# Patient Record
Sex: Female | Born: 1975 | Race: White | Hispanic: No | Marital: Married | Smoking: Former smoker
Health system: Southern US, Community
[De-identification: ages and names within clinical notes are randomized; demographics above are authoritative.]

## PROBLEM LIST (undated history)

## (undated) DIAGNOSIS — E039 Hypothyroidism, unspecified: Secondary | ICD-10-CM

## (undated) DIAGNOSIS — F32A Depression, unspecified: Secondary | ICD-10-CM

## (undated) DIAGNOSIS — R7303 Prediabetes: Secondary | ICD-10-CM

## (undated) DIAGNOSIS — F909 Attention-deficit hyperactivity disorder, unspecified type: Secondary | ICD-10-CM

## (undated) DIAGNOSIS — R87619 Unspecified abnormal cytological findings in specimens from cervix uteri: Secondary | ICD-10-CM

## (undated) DIAGNOSIS — J45909 Unspecified asthma, uncomplicated: Secondary | ICD-10-CM

## (undated) DIAGNOSIS — M1990 Unspecified osteoarthritis, unspecified site: Secondary | ICD-10-CM

## (undated) DIAGNOSIS — F319 Bipolar disorder, unspecified: Secondary | ICD-10-CM

## (undated) DIAGNOSIS — B977 Papillomavirus as the cause of diseases classified elsewhere: Secondary | ICD-10-CM

## (undated) DIAGNOSIS — F329 Major depressive disorder, single episode, unspecified: Secondary | ICD-10-CM

## (undated) HISTORY — DX: Major depressive disorder, single episode, unspecified: F32.9

## (undated) HISTORY — DX: Depression, unspecified: F32.A

## (undated) HISTORY — DX: Unspecified abnormal cytological findings in specimens from cervix uteri: R87.619

## (undated) HISTORY — PX: COLPOSCOPY: SHX161

## (undated) HISTORY — PX: NO PAST SURGERIES: SHX2092

## (undated) HISTORY — DX: Hypothyroidism, unspecified: E03.9

## (undated) HISTORY — PX: NECK SURGERY: SHX720

## (undated) HISTORY — DX: Papillomavirus as the cause of diseases classified elsewhere: B97.7

## (undated) HISTORY — PX: WISDOM TOOTH EXTRACTION: SHX21

---

## 2000-10-25 ENCOUNTER — Other Ambulatory Visit: Admission: RE | Admit: 2000-10-25 | Discharge: 2000-10-25 | Payer: Self-pay | Admitting: Obstetrics and Gynecology

## 2007-07-17 ENCOUNTER — Emergency Department: Payer: Self-pay | Admitting: Emergency Medicine

## 2008-09-29 ENCOUNTER — Emergency Department: Payer: Self-pay | Admitting: Internal Medicine

## 2008-12-20 ENCOUNTER — Ambulatory Visit: Payer: Self-pay

## 2010-06-16 ENCOUNTER — Ambulatory Visit: Payer: Self-pay | Admitting: Internal Medicine

## 2015-04-21 LAB — HM PAP SMEAR

## 2015-06-03 ENCOUNTER — Ambulatory Visit (INDEPENDENT_AMBULATORY_CARE_PROVIDER_SITE_OTHER): Payer: BLUE CROSS/BLUE SHIELD | Admitting: Obstetrics and Gynecology

## 2015-06-03 ENCOUNTER — Encounter: Payer: Self-pay | Admitting: Obstetrics and Gynecology

## 2015-06-03 VITALS — BP 118/73 | HR 78 | Wt 214.4 lb

## 2015-06-03 DIAGNOSIS — IMO0002 Reserved for concepts with insufficient information to code with codable children: Secondary | ICD-10-CM

## 2015-06-03 DIAGNOSIS — R896 Abnormal cytological findings in specimens from other organs, systems and tissues: Secondary | ICD-10-CM

## 2015-06-03 NOTE — Progress Notes (Signed)
Patient ID: Lisa Schroeder, female   DOB: 1976/04/25, 39 y.o.   MRN: [id number]  ENCOMPASS COLPOSCOPY PROCEDURE NOTE  39 y.o. No obstetric history on file. here for colposcopy for low-grade squamous intraepithelial neoplasia (LGSIL - encompassing HPV,mild dysplasia,CIN I) pap smear on Aug 2016. Discussed role for HPV in cervical dysplasia, need for surveillance.  Patient given informed consent, signed copy in the chart, time out was performed.  Placed in lithotomy position. Cervix viewed with speculum and colposcope after application of acetic acid.   Colposcopy adequate? Yes  acetowhite lesion(s) noted at 6 o'clock; corresponding biopsies obtained.  ECC specimen obtained. All specimens were labelled and sent to pathology. See scanned colpo sheet with detailed drawing.  Patient was given post procedure instructions.  Will follow up pathology and manage accordingly.  Routine preventative health maintenance measures emphasized.     Melody Elissa Lovett, CNM

## 2015-06-03 NOTE — Patient Instructions (Signed)
Thank you for enrolling in MyChart. Please follow the instructions below to securely access your online medical record. MyChart allows you to send messages to your doctor, view your test results, renew your prescriptions, schedule appointments, and more.  How Do I Sign Up? 1. In your Internet browser, go to [email or web address] 2. Click on the New  User? link in the Sign In box.  3. Enter your MyChart Access Code exactly as it appears below. You will not need to use this code after you have completed the sign-up process. If you do not sign up before the expiration date, you must request a new code. MyChart Access Code: [email or web address] Expires: 08/02/2015 10:20 AM  4. Enter the last four digits of your Social Security Number (xxxx) and Date of Birth (mm/dd/yyyy) as indicated and click Next. You will be taken to the next sign-up page. 5. Create a MyChart ID. This will be your MyChart login ID and cannot be changed, so think of one that is secure and easy to remember. 6. Create a MyChart password. You can change your password at any time. 7. Enter your Password Reset Question and Answer and click Next. This can be used at a later time if you forget your password.  8. Select your communication preference, and if applicable enter your e-mail address. You will receive e-mail notification when new information is available in MyChart by choosing to receive e-mail notifications and filling in your e-mail. 9. Click Sign In. You can now view your medical record.   Additional Information If you have questions, you can email [email or web address] or call [phone] to talk to our MyChart staff. Remember, MyChart is NOT to be used for urgent needs. For medical emergencies, dial 911. Abnormal Pap Test Information During a Pap test, the cells on the surface of your cervix are checked to see if they look normal, abnormal, or if they show signs of having been altered by a certain  type of virus called human papillomavirus, or HPV. Cervical cells that have been affected by HPV are called dysplasia. Dysplasia is not cancer, but describes abnormal cells found on the surface of the cervix. Depending on the degree of dysplasia, some of the cells may be considered pre-cancerous and may turn into cancer over time if follow up with a caregiver is delayed.  WHAT DOES AN ABNORMAL PAP TEST MEAN? Having an abnormal pap test does not mean that you have cancer. However, certain types of abnormal pap tests can be a sign that a person is at a higher risk of developing cancer. Your caregiver will want to do other tests to find out more about the abnormal cells. Your abnormal Pap test results could show:   Small and uncertain changes that should be carefully watched.   Cervical dysplasia that has caused mild changes and can be followed over time.  Cervical dysplasia that is more severe and needs to be followed and treated to ensure the problem goes away.  Cancer.  When severe cervical dysplasia is found and treated early, it rarely will grow into cancer.  WHAT WILL BE DONE ABOUT MY ABNORMAL PAP TEST?  A colposcopy may be needed. This is a procedure where your cervix is examined using light and magnification.  A small tissue sample of your cervix (biopsy) may need to be removed and then examined. This is often performed if there are areas that appear infected.  A sample of cells from the cervical canal may be removed  with either a small brush or scraping instrument (curette). Based on the results of the procedures above, some caregivers may recommend either cryotherapy of the cervix or a surgical LEEP where a portion of the cervix is removed. LEEP is short for "loop electrical excisional procedure." Rarely, a caregiver may recommend a cone biopsy.This is a procedure where a small, cone-shaped sample of your cervix is taken out. The part that is taken out is the area where the abnormal  cells are.  WHAT IF I HAVE A DYSPLASIA OR A CANCER? You may be referred to a specialist. Radiation may also be a treatment for more advanced cancer. Having a hysterectomy is the last treatment option for dysplasia, but it is a more common treatment for someone with cancer. All treatment options will be discussed with you by your caregiver. WHAT SHOULD YOU DO AFTER BEING TREATED? If you have had an abnormal pap test, you should continue to have regular pap tests and check-ups as directed by your caregiver. Your cervical problem will be carefully watched so it does not get worse. Also, your caregiver can watch for, and treat, any new problems that may come up. Document Released: 12/29/2010 Document Revised: 01/08/2013 Document Reviewed: 09/09/2011 [facility] Patient Information 2015 [place], [place]. This information is not intended to replace advice given to you by your health care provider. Make sure you discuss any questions you have with your health care provider. COLPOSCOPY POST-PROCEDURE INSTRUCTIONS  1. You may take Ibuprofen, Aleve or Tylenol for cramping if needed.  2. If Monsel's solution was used, you will have a black discharge.  3. Light bleeding is normal.  If bleeding is heavier than your period, please call.  4. Put nothing in your vagina until the bleeding or discharge stops (usually 2 or3 days).  5. We will call you within one week with biopsy results or discuss the results at your follow-up appointment if needed. 6.

## 2015-06-05 ENCOUNTER — Encounter: Payer: Self-pay | Admitting: Obstetrics and Gynecology

## 2015-06-06 ENCOUNTER — Telehealth: Payer: Self-pay | Admitting: *Deleted

## 2015-06-06 NOTE — Telephone Encounter (Signed)
-----   Message from Ulyses Amor, [place] sent at 06/05/2015 10:05 AM EDT ----- Please let her know colpo biopsies c/w pap results, will continue with repeat pap in 6 months as planned

## 2015-06-06 NOTE — Telephone Encounter (Signed)
Notified pt of results, she voiced understanding.

## 2015-06-09 ENCOUNTER — Encounter: Payer: Self-pay | Admitting: Obstetrics and Gynecology

## 2015-09-01 ENCOUNTER — Other Ambulatory Visit: Payer: Self-pay | Admitting: Internal Medicine

## 2015-09-01 DIAGNOSIS — R1011 Right upper quadrant pain: Secondary | ICD-10-CM

## 2015-09-08 ENCOUNTER — Ambulatory Visit
Admission: RE | Admit: 2015-09-08 | Discharge: 2015-09-08 | Disposition: A | Discharge status: Home / Self Care | Payer: BLUE CROSS/BLUE SHIELD | Source: Ambulatory Visit | Attending: Internal Medicine | Admitting: Internal Medicine

## 2015-09-08 DIAGNOSIS — R1011 Right upper quadrant pain: Secondary | ICD-10-CM | POA: Insufficient documentation

## 2015-12-16 ENCOUNTER — Ambulatory Visit: Payer: BLUE CROSS/BLUE SHIELD | Admitting: Obstetrics and Gynecology

## 2015-12-24 ENCOUNTER — Ambulatory Visit (INDEPENDENT_AMBULATORY_CARE_PROVIDER_SITE_OTHER): Payer: 59 | Admitting: Obstetrics and Gynecology

## 2015-12-24 ENCOUNTER — Other Ambulatory Visit: Payer: Self-pay | Admitting: Obstetrics and Gynecology

## 2015-12-24 ENCOUNTER — Encounter: Payer: Self-pay | Admitting: Obstetrics and Gynecology

## 2015-12-24 VITALS — BP 117/72 | HR 87 | Ht 66.0 in | Wt 225.7 lb

## 2015-12-24 DIAGNOSIS — R87612 Low grade squamous intraepithelial lesion on cytologic smear of cervix (LGSIL): Secondary | ICD-10-CM | POA: Diagnosis not present

## 2015-12-24 DIAGNOSIS — E669 Obesity, unspecified: Secondary | ICD-10-CM

## 2015-12-24 MED ORDER — PHENTERMINE HCL 37.5 MG PO TABS: 37.5000 mg | ORAL_TABLET | Freq: Every day | ORAL | Status: DC

## 2015-12-24 MED ORDER — CYANOCOBALAMIN 1000 MCG/ML IJ SOLN: 1000.0000 ug | Freq: Once | INTRAMUSCULAR | Status: DC

## 2015-12-24 NOTE — Progress Notes (Signed)
Subjective:  Lisa Schroeder is a 40 y.o. G1P0 at Unknown being seen today for weight loss management- initial visit.  Patient reports General ROS: negative and reports previous weight loss attempts: Has done weight watchers a few times over the years, with some success and then regains weight.  The following portions of the patient's history were reviewed and updated as appropriate: allergies, current medications, past family history, past medical history, past social history, past surgical history and problem list.   Objective:   Filed Vitals:   12/24/15 0833  BP: 117/72  Pulse: 87  Height: 5\' 6"  (1.676 m)  Weight: 225 lb 11.2 oz (102.377 kg)    General:  Alert, oriented and cooperative. Patient is in no acute distress.  :   :   :   :   :   :   PE: Well groomed female in no current distress,   Mental Status: Normal mood and affect. Normal behavior. Normal judgment and thought content.   Current BMI: Body mass index is 36.45 kg/(m^2).   Assessment and Plan:  Obesity  1. Low grade squamous intraepithelial lesion on cytologic smear of cervix (lgsil) - Cytology - PAP  2. Obesity (BMI 30-39.9)will start weight loss plan today   Plan: low carb, High protein diet RX for adipex 37.5 mg daily and B12 1000mcg.ml monthly, to start now with first injection given at today's visit. Reviewed side-effects common to both medications and expected outcomes. Increase daily water intake to at least 8 bottle a day, every day.  Goal is to reduse weight by 10% by end of three months, and will re-evaluate then.  RTC in 4 weeks for Nurse visit to check weight & BP, and get next B12 injections.    Please refer to After Visit Summary for other counseling recommendations.    Gleb Mcguire N [place], CNM   Anquinette Pierro NIKE Yilin Weedon, CNM      Consider the Low Glycemic Index Diet and 6 smaller meals daily .  This boosts your metabolism and regulates your sugars:   Use the protein bar by Atkins  because they have lots of fiber in them  Find the low carb flatbreads, tortillas and pita breads for sandwiches:  Joseph's makes a pita bread and a flat bread , available at [facility] and [employer]; Toufayah makes a low carb flatbread available at [employer] and HT that is 9 net carbs and 100 cal Mission makes a low carb whole wheat tortilla available at [employer],and most grocery stores with 6 net carbs and 210 cal  [place] yogurt can still have a lot of carbs .  Dannon Light N fit has 80 cal and 8 carbs

## 2015-12-25 LAB — CYTOLOGY - PAP

## 2015-12-30 ENCOUNTER — Other Ambulatory Visit: Payer: Self-pay | Admitting: Obstetrics and Gynecology

## 2015-12-31 ENCOUNTER — Telehealth: Payer: Self-pay | Admitting: *Deleted

## 2015-12-31 NOTE — Telephone Encounter (Signed)
Notified pt of results, mailed all info to pt 

## 2015-12-31 NOTE — Telephone Encounter (Signed)
-----   Message from Purcell NailsMelody N Shambley, [place] sent at 12/30/2015  9:49 PM EDT ----- Please let her know pap is still the same LGSIL, +HPV, will repeat in 6 months as planned

## 2016-01-19 ENCOUNTER — Ambulatory Visit (INDEPENDENT_AMBULATORY_CARE_PROVIDER_SITE_OTHER): Payer: 59 | Admitting: Obstetrics and Gynecology

## 2016-01-19 VITALS — BP 131/99 | HR 66 | Wt 206.4 lb

## 2016-01-19 DIAGNOSIS — E669 Obesity, unspecified: Secondary | ICD-10-CM | POA: Diagnosis not present

## 2016-01-19 MED ORDER — CYANOCOBALAMIN 1000 MCG/ML IJ SOLN
1000.0000 ug | Freq: Once | INTRAMUSCULAR | Status: AC
  Administered 2016-01-19: 1000 ug via INTRAMUSCULAR

## 2016-01-19 NOTE — Progress Notes (Signed)
Patient ID: Lisa Schroeder, female   DOB: [birth year], 40 y.o.   MRN: [id number] Pt presents for weight, B/P, B-12 injection. No side effects of medication-Phentermine, or B-12.  Weight loss of 9 lbs. Encouraged eating healthy and exercise. Pt to check B/P daily and contact office if it remains elevated or develops s/s. C/O headache yesterday. No dizziness, no blurred vision, no headache.

## 2016-02-16 ENCOUNTER — Ambulatory Visit: Payer: 59

## 2016-03-01 ENCOUNTER — Ambulatory Visit (INDEPENDENT_AMBULATORY_CARE_PROVIDER_SITE_OTHER): Payer: 59 | Admitting: Obstetrics and Gynecology

## 2016-03-01 VITALS — BP 121/83 | HR 82 | Wt 213.1 lb

## 2016-03-01 DIAGNOSIS — E669 Obesity, unspecified: Secondary | ICD-10-CM

## 2016-03-01 MED ORDER — CYANOCOBALAMIN 1000 MCG/ML IJ SOLN
1000.0000 ug | Freq: Once | INTRAMUSCULAR | Status: AC
  Administered 2016-03-01: 1000 ug via INTRAMUSCULAR

## 2016-03-01 NOTE — Progress Notes (Signed)
Patient ID: Lisa Schroeder, female   DOB: 1976-04-20, 40 y.o.   MRN: [id number] Pt presents for weight, B/P, B-12 injection. No side effects of medication-Phentermine, or B-12.  Weight gain of  7 lbs. Pt states she is only taking 1/2 tab. Because of her BP and feeling bad in pm with headache and fatigue.  Encouraged eating healthy and exercise.

## 2016-03-31 ENCOUNTER — Encounter: Payer: Self-pay | Admitting: Obstetrics and Gynecology

## 2016-03-31 ENCOUNTER — Ambulatory Visit (INDEPENDENT_AMBULATORY_CARE_PROVIDER_SITE_OTHER): Payer: 59 | Admitting: Obstetrics and Gynecology

## 2016-03-31 VITALS — BP 119/84 | HR 82 | Ht 65.0 in | Wt 212.5 lb

## 2016-03-31 DIAGNOSIS — E669 Obesity, unspecified: Secondary | ICD-10-CM

## 2016-03-31 MED ORDER — CYANOCOBALAMIN 1000 MCG/ML IJ SOLN: 1000.0000 ug | Freq: Once | INTRAMUSCULAR | Status: DC

## 2016-03-31 MED ORDER — CYANOCOBALAMIN 1000 MCG/ML IJ SOLN
1000.0000 ug | Freq: Once | INTRAMUSCULAR | Status: AC
  Administered 2016-03-31: 1000 ug via INTRAMUSCULAR

## 2016-03-31 NOTE — Progress Notes (Signed)
Patient ID: Lisa Schroeder, female   DOB: Feb 21, 1976, 40 y.o.   MRN: [id number] Pt is here for weight management, desires refill on her Phentermine Denies any s/e, encouraged patient to exercise and watch her diet  01/19/16 wt-206lb 03/01/16 wt-213lb   Wt- 212.5lb BP-119/84 P-82

## 2016-04-26 ENCOUNTER — Ambulatory Visit (INDEPENDENT_AMBULATORY_CARE_PROVIDER_SITE_OTHER): Payer: 59 | Admitting: Obstetrics and Gynecology

## 2016-04-26 VITALS — BP 111/78 | HR 90 | Wt 210.4 lb

## 2016-04-26 DIAGNOSIS — E669 Obesity, unspecified: Secondary | ICD-10-CM

## 2016-04-26 MED ORDER — CYANOCOBALAMIN 1000 MCG/ML IJ SOLN
1000.0000 ug | Freq: Once | INTRAMUSCULAR | Status: AC
  Administered 2016-04-26: 1000 ug via INTRAMUSCULAR

## 2016-04-26 NOTE — Progress Notes (Signed)
Patient ID: Lisa Schroeder, female   DOB: [birth year], 40 y.o.   MRN: [id number] Pt presents for weight, B/P, B-12 injection. No side effects of medication-Phentermine, or B-12.  Weight loss of  2 lbs. Encouraged eating healthy and exercise.

## 2016-05-24 ENCOUNTER — Ambulatory Visit (INDEPENDENT_AMBULATORY_CARE_PROVIDER_SITE_OTHER): Payer: 59 | Admitting: Obstetrics and Gynecology

## 2016-05-24 VITALS — BP 113/76 | HR 65 | Ht 65.0 in | Wt 208.5 lb

## 2016-05-24 DIAGNOSIS — E669 Obesity, unspecified: Secondary | ICD-10-CM

## 2016-05-24 MED ORDER — CYANOCOBALAMIN 1000 MCG/ML IJ SOLN
1000.0000 ug | Freq: Once | INTRAMUSCULAR | Status: AC
  Administered 2016-05-24: 1000 ug via INTRAMUSCULAR

## 2016-05-24 NOTE — Progress Notes (Signed)
Patient ID: Lisa Schroeder, female   DOB: July 27, 1976, 40 y.o.   MRN: [id number] Pt presents for weight, B/P, B-12 injection. No side effects of medication-Phentermine, or B-12.  Weight gain of _1_ lbs. Encouraged eating healthy and exercise.

## 2016-06-21 ENCOUNTER — Ambulatory Visit (INDEPENDENT_AMBULATORY_CARE_PROVIDER_SITE_OTHER): Payer: 59 | Admitting: Obstetrics and Gynecology

## 2016-06-21 VITALS — BP 117/84 | HR 98 | Wt 206.2 lb

## 2016-06-21 DIAGNOSIS — E669 Obesity, unspecified: Secondary | ICD-10-CM | POA: Diagnosis not present

## 2016-06-21 MED ORDER — CYANOCOBALAMIN 1000 MCG/ML IJ SOLN
1000.0000 ug | Freq: Once | INTRAMUSCULAR | Status: AC
  Administered 2016-06-21: 1000 ug via INTRAMUSCULAR

## 2016-06-21 NOTE — Progress Notes (Signed)
Pt is here for wt, bp check, b-12 inj She only take 1/2 tablet, makes her too jittery  05/24/16 wt-208 04/26/16 wt-210

## 2016-07-02 ENCOUNTER — Ambulatory Visit (INDEPENDENT_AMBULATORY_CARE_PROVIDER_SITE_OTHER): Payer: 59 | Admitting: Obstetrics and Gynecology

## 2016-07-02 ENCOUNTER — Encounter: Payer: Self-pay | Admitting: Obstetrics and Gynecology

## 2016-07-02 ENCOUNTER — Other Ambulatory Visit: Payer: Self-pay | Admitting: Obstetrics and Gynecology

## 2016-07-02 ENCOUNTER — Ambulatory Visit (INDEPENDENT_AMBULATORY_CARE_PROVIDER_SITE_OTHER): Payer: 59

## 2016-07-02 VITALS — BP 114/84 | HR 77 | Ht 65.0 in | Wt 207.9 lb

## 2016-07-02 DIAGNOSIS — R1031 Right lower quadrant pain: Secondary | ICD-10-CM

## 2016-07-02 DIAGNOSIS — R87612 Low grade squamous intraepithelial lesion on cytologic smear of cervix (LGSIL): Secondary | ICD-10-CM | POA: Diagnosis not present

## 2016-07-02 DIAGNOSIS — N83201 Unspecified ovarian cyst, right side: Secondary | ICD-10-CM

## 2016-07-02 MED ORDER — IBUPROFEN 800 MG PO TABS: 800.0000 mg | ORAL_TABLET | Freq: Three times a day (TID) | ORAL | 1 refills | Status: DC | PRN

## 2016-07-02 NOTE — Patient Instructions (Signed)
Thank you for enrolling in MyChart. Please follow the instructions below to securely access your online medical record. MyChart allows you to send messages to your doctor, view your test results, renew your prescriptions, schedule appointments, and more.  How Do I Sign Up? 1. In your Internet browser, go to [email or web address]. 2. Click on the New  User? link in the Sign In box.  3. Enter your MyChart Access Code exactly as it appears below. You will not need to use this code after you have completed the sign-up process. If you do not sign up before the expiration date, you must request a new code. MyChart Access Code: [email or web address] Expires: 08/31/2016  9:29 AM  4. Enter the last four digits of your Social Security Number (xxxx) and Date of Birth (mm/dd/yyyy) as indicated and click Next. You will be taken to the next sign-up page. 5. Create a MyChart ID. This will be your MyChart login ID and cannot be changed, so think of one that is secure and easy to remember. 6. Create a MyChart password. You can change your password at any time. 7. Enter your Password Reset Question and Answer and click Next. This can be used at a later time if you forget your password.  8. Select your communication preference, and if applicable enter your e-mail address. You will receive e-mail notification when new information is available in MyChart by choosing to receive e-mail notifications and filling in your e-mail. 9. Click Sign In. You can now view your medical record.   Additional Information If you have questions, you can email [email or web address] or call [phone] to talk to our MyChart staff. Remember, MyChart is NOT to be used for urgent needs. For medical emergencies, dial 911.   Ovarian Cyst An ovarian cyst is a fluid-filled sac that forms on an ovary. The ovaries are small organs that produce eggs in women. Various types of cysts can form on the ovaries. Most are not  cancerous. Many do not cause problems, and they often go away on their own. Some may cause symptoms and require treatment. Common types of ovarian cysts include:  Functional cysts--These cysts may occur every month during the menstrual cycle. This is normal. The cysts usually go away with the next menstrual cycle if the woman does not get pregnant. Usually, there are no symptoms with a functional cyst.  Endometrioma cysts--These cysts form from the tissue that lines the uterus. They are also called "chocolate cysts" because they become filled with blood that turns brown. This type of cyst can cause pain in the lower abdomen during intercourse and with your menstrual period.  Cystadenoma cysts--This type develops from the cells on the outside of the ovary. These cysts can get very big and cause lower abdomen pain and pain with intercourse. This type of cyst can twist on itself, cut off its blood supply, and cause severe pain. It can also easily rupture and cause a lot of pain.  Dermoid cysts--This type of cyst is sometimes found in both ovaries. These cysts may contain different kinds of body tissue, such as skin, teeth, hair, or cartilage. They usually do not cause symptoms unless they get very big.  Theca lutein cysts--These cysts occur when too much of a certain hormone (human chorionic gonadotropin) is produced and overstimulates the ovaries to produce an egg. This is most common after procedures used to assist with the conception of a baby (in vitro fertilization). CAUSES   Fertility drugs can  cause a condition in which multiple large cysts are formed on the ovaries. This is called ovarian hyperstimulation syndrome.  A condition called polycystic ovary syndrome can cause hormonal imbalances that can lead to nonfunctional ovarian cysts. SIGNS AND SYMPTOMS  Many ovarian cysts do not cause symptoms. If symptoms are present, they may include:  Pelvic pain or pressure.  Pain in the lower  abdomen.  Pain during sexual intercourse.  Increasing girth (swelling) of the abdomen.  Abnormal menstrual periods.  Increasing pain with menstrual periods.  Stopping having menstrual periods without being pregnant. DIAGNOSIS  These cysts are commonly found during a routine or annual pelvic exam. Tests may be ordered to find out more about the cyst. These tests may include:  Ultrasound.  X-ray of the pelvis.  CT scan.  MRI.  Blood tests. TREATMENT  Many ovarian cysts go away on their own without treatment. Your health care provider may want to check your cyst regularly for 2-3 months to see if it changes. For women in menopause, it is particularly important to monitor a cyst closely because of the higher rate of ovarian cancer in menopausal women. When treatment is needed, it may include any of the following:  A procedure to drain the cyst (aspiration). This may be done using a long needle and ultrasound. It can also be done through a laparoscopic procedure. This involves using a thin, lighted tube with a tiny camera on the end (laparoscope) inserted through a small incision.  Surgery to remove the whole cyst. This may be done using laparoscopic surgery or an open surgery involving a larger incision in the lower abdomen.  Hormone treatment or birth control pills. These methods are sometimes used to help dissolve a cyst. HOME CARE INSTRUCTIONS   Only take over-the-counter or prescription medicines as directed by your health care provider.  Follow up with your health care provider as directed.  Get regular pelvic exams and Pap tests. SEEK MEDICAL CARE IF:   Your periods are late, irregular, or painful, or they stop.  Your pelvic pain or abdominal pain does not go away.  Your abdomen becomes larger or swollen.  You have pressure on your bladder or trouble emptying your bladder completely.  You have pain during sexual intercourse.  You have feelings of fullness,  pressure, or discomfort in your stomach.  You lose weight for no apparent reason.  You feel generally ill.  You become constipated.  You lose your appetite.  You develop acne.  You have an increase in body and facial hair.  You are gaining weight, without changing your exercise and eating habits.  You think you are pregnant. SEEK IMMEDIATE MEDICAL CARE IF:   You have increasing abdominal pain.  You feel sick to your stomach (nauseous), and you throw up (vomit).  You develop a fever that comes on suddenly.  You have abdominal pain during a bowel movement.  Your menstrual periods become heavier than usual. MAKE SURE YOU:  Understand these instructions.  Will watch your condition.  Will get help right away if you are not doing well or get worse.   This information is not intended to replace advice given to you by your health care provider. Make sure you discuss any questions you have with your health care provider.   Document Released: 09/13/2005 Document Revised: 09/18/2013 Document Reviewed: 05/21/2013 Elsevier Interactive Patient Education [employer]2016 Elsevier Inc.

## 2016-07-02 NOTE — Progress Notes (Signed)
Subjective:     Patient ID: Lisa Schroeder, female   DOB: 05-Oct-1975, 40 y.o.   MRN: [id number]  HPI Previous pap abnormal and here for repeat. Does reports RLQ colicky pain since December 2016, and reports abdominal ultrasound done- was told everything is normal. Pain continues, mild and occurs daily. Does not radiate and no association with BM, urination or sex. Is currently amenorrheic due to mirena use.  Review of Systems Negative except stated above in HPI    Objective:   Physical Exam A&O x4 Well groomed female in no distress Blood pressure 114/84, pulse 77, height 5\' 5"  (1.651 m), weight 207 lb 14.4 oz (94.3 kg). Abdomen soft and non-tender on exam, BSx4 normal Pelvic exam: normal external genitalia, vulva, vagina, cervix, uterus and adnexa. pelvic ultrasound today reveals:  Findings:  The uterus measures 7 x 3.5 x 4.1 cm. Echo texture is homogenous without evidence of focal masses.  The Endometrium measures 4.6 mm.  Right Ovary measures 2 x 1 x 1.6 cm. It is normal in appearance. Left Ovary measures 2.6 x 1.3 x 1.6 cm. It is normal appearance. Survey of the adnexa shows a simple cyst measuring 2.5 x 1.5 x 1.7cmin the right adnexa.  The cyst is not associated with the right ovary.  There is a small amuont of free fluid surrounding the cyst.  There is no free fluid in the cul de sac.     Assessment:     Repeat pap RLQ colicky pain Right adnexal cyst    Plan:     Started on ibuprofen 800mg  tid x 1 week, then prn. Started on LoLoEstrin continuously x 6 weeks. Will return in six weeks for follow up u/s and then 6 months for AE.  Joane Postel [place], CNM

## 2016-07-05 LAB — CYTOLOGY - PAP

## 2016-07-12 ENCOUNTER — Telehealth: Payer: Self-pay | Admitting: *Deleted

## 2016-07-12 NOTE — Telephone Encounter (Signed)
-----   Message from Purcell NailsMelody N Shambley, [place] sent at 07/07/2016 11:53 AM EDT ----- Please let her know her pap was abnormal, ASCUS,HPV+, please mail info on findings, and she needs a colpo.

## 2016-07-12 NOTE — Telephone Encounter (Signed)
Mailed pt all info about her pap results

## 2016-07-19 ENCOUNTER — Ambulatory Visit (INDEPENDENT_AMBULATORY_CARE_PROVIDER_SITE_OTHER): Payer: 59 | Admitting: Obstetrics and Gynecology

## 2016-07-19 ENCOUNTER — Ambulatory Visit: Payer: 59

## 2016-07-19 VITALS — BP 118/74 | HR 65 | Ht 65.0 in | Wt 204.5 lb

## 2016-07-19 DIAGNOSIS — E669 Obesity, unspecified: Secondary | ICD-10-CM | POA: Diagnosis not present

## 2016-07-19 MED ORDER — CYANOCOBALAMIN 1000 MCG/ML IJ SOLN
1000.0000 ug | Freq: Once | INTRAMUSCULAR | Status: AC
  Administered 2016-07-19: 1000 ug via INTRAMUSCULAR

## 2016-07-19 NOTE — Progress Notes (Signed)
Pt presents for 1 month f/u wt,bp, and b12. She is taking only 1/2 tab daily. NO s/e noted. Wt is down 2#. Pt advised she needs to f/u with [facility] in 4 weeks. She has a f/u appointment for u/s results in 3 weeks. Pt would like to f/u with [facility] at that time.

## 2016-08-10 ENCOUNTER — Other Ambulatory Visit: Payer: Self-pay | Admitting: Obstetrics and Gynecology

## 2016-08-10 DIAGNOSIS — N83201 Unspecified ovarian cyst, right side: Secondary | ICD-10-CM

## 2016-08-11 ENCOUNTER — Other Ambulatory Visit: Payer: Self-pay | Admitting: Obstetrics and Gynecology

## 2016-08-11 ENCOUNTER — Encounter: Payer: Self-pay | Admitting: Obstetrics and Gynecology

## 2016-08-11 ENCOUNTER — Ambulatory Visit (INDEPENDENT_AMBULATORY_CARE_PROVIDER_SITE_OTHER): Payer: 59 | Admitting: Obstetrics and Gynecology

## 2016-08-11 ENCOUNTER — Ambulatory Visit (INDEPENDENT_AMBULATORY_CARE_PROVIDER_SITE_OTHER): Payer: 59

## 2016-08-11 VITALS — BP 113/82 | HR 73 | Wt 203.0 lb

## 2016-08-11 DIAGNOSIS — Z9889 Other specified postprocedural states: Secondary | ICD-10-CM

## 2016-08-11 DIAGNOSIS — Z9289 Personal history of other medical treatment: Secondary | ICD-10-CM

## 2016-08-11 DIAGNOSIS — R87611 Atypical squamous cells cannot exclude high grade squamous intraepithelial lesion on cytologic smear of cervix (ASC-H): Secondary | ICD-10-CM

## 2016-08-11 DIAGNOSIS — N83201 Unspecified ovarian cyst, right side: Secondary | ICD-10-CM | POA: Diagnosis not present

## 2016-08-11 NOTE — Progress Notes (Signed)
ENCOMPASS COLPOSCOPY PROCEDURE NOTE  40 y.o. G1P0 here for colposcopy for ASCUS with POSITIVE high risk HPV pap smear on 06/2016. Discussed role for HPV in cervical dysplasia, need for surveillance.  Patient given informed consent, signed copy in the chart, time out was performed.  Placed in lithotomy position. Cervix viewed with speculum and colposcope after application of acetic acid.   Colposcopy adequate? Yes  visible lesion(s) at 5 o'clock and mosaicism noted at 12 o'clock; corresponding biopsies obtained.  ECC specimen obtained. IUD string noted and not disturbed. All specimens were labelled and sent to pathology. See scanned colpo sheet with detailed drawing.   Patient was given post procedure instructions.  Will follow up pathology and manage accordingly.  Routine preventative health maintenance measures emphasized.  Reviewed the following.   Indications: F/U Right adnexal cyst Findings:  The uterus measures 7.7 x 3.2 x 3.9 cm. An IUD is seen and on 3D imaging appears to be in the proper placement within the uterus.  Echo texture is homogenous without evidence of focal masses.  The Endometrium measures 3.2 mm.  Right Ovary measures 2.2 x 1.2 x 1.7 cm, and appears WNL. Left Ovary measures 2.6 x 2.1 x 1.6 cm, a 1 cm dominant follicle is seen, otherwise, appears WNL. Survey of the right adnexa again demonstrates a cystic area today measuring 2.0 x 1.2 x 1.4 cm which is smaller than prior ultrasound. Left adnexa is surveyed and appears WNL. There is no free fluid in the cul de sac.  Impression: 1. Right adnexal cyst which is smaller on today's exam.  Will continue current OCPs (LoLoEstrin) for 4 more weeks and then stop.   Eliot Bencivenga Suzan NailerN Ido Wollman, CNM

## 2016-08-12 ENCOUNTER — Encounter: Payer: 59 | Admitting: Obstetrics and Gynecology

## 2016-08-12 ENCOUNTER — Other Ambulatory Visit: Payer: 59

## 2016-08-13 ENCOUNTER — Encounter: Payer: 59 | Admitting: Obstetrics and Gynecology

## 2016-08-13 ENCOUNTER — Other Ambulatory Visit: Payer: 59

## 2016-08-16 ENCOUNTER — Encounter: Payer: Self-pay | Admitting: Obstetrics and Gynecology

## 2017-01-06 ENCOUNTER — Encounter: Payer: 59 | Admitting: Obstetrics and Gynecology

## 2017-01-13 ENCOUNTER — Encounter: Payer: 59 | Admitting: Obstetrics and Gynecology

## 2017-01-17 ENCOUNTER — Other Ambulatory Visit: Payer: Self-pay | Admitting: Obstetrics and Gynecology

## 2017-01-17 ENCOUNTER — Encounter: Payer: Self-pay | Admitting: Obstetrics and Gynecology

## 2017-01-17 ENCOUNTER — Ambulatory Visit (INDEPENDENT_AMBULATORY_CARE_PROVIDER_SITE_OTHER): Payer: 59 | Admitting: Obstetrics and Gynecology

## 2017-01-17 VITALS — BP 104/73 | HR 81 | Ht 66.0 in | Wt 215.1 lb

## 2017-01-17 DIAGNOSIS — Z01419 Encounter for gynecological examination (general) (routine) without abnormal findings: Secondary | ICD-10-CM

## 2017-01-17 MED ORDER — CYANOCOBALAMIN 1000 MCG/ML IJ SOLN: 1000.0000 ug | INTRAMUSCULAR | 2 refills | Status: DC

## 2017-01-17 MED ORDER — BUPROPION HCL 100 MG PO TABS: 100.0000 mg | ORAL_TABLET | Freq: Every day | ORAL | 4 refills | Status: DC

## 2017-01-17 MED ORDER — PHENDIMETRAZINE TARTRATE 35 MG PO TABS: 1.0000 | ORAL_TABLET | Freq: Every day | ORAL | 2 refills | Status: DC

## 2017-01-17 MED ORDER — CYANOCOBALAMIN 1000 MCG/ML IJ SOLN
1000.0000 ug | Freq: Once | INTRAMUSCULAR | Status: AC
  Administered 2017-01-17: 1000 ug via INTRAMUSCULAR

## 2017-01-17 NOTE — Addendum Note (Signed)
Addended by: Marchelle Folks on: 01/17/2017 05:21 PM   Modules accepted: Orders

## 2017-01-17 NOTE — Progress Notes (Signed)
Subjective:   Lisa Schroeder is a 41 y.o. G1P0 Caucasian female here for a routine well-woman exam.  No LMP recorded (lmp unknown). Patient is not currently having periods (Reason: IUD).    Current complaints: desires weight loss medications but couldn't tolerate adipex due to headaches. Heard about contrave and desires trying it. PCP: Dan Humphreys       does desire labs  Social History: Sexual: heterosexual Marital Status: single Living situation: with family Occupation: dental hygenist Tobacco/alcohol: no tobacco use Illicit drugs: no history of illicit drug use  The following portions of the patient's history were reviewed and updated as appropriate: allergies, current medications, past family history, past medical history, past social history, past surgical history and problem list.  Past Medical History Past Medical History:  Diagnosis Date  . Abnormal Pap smear of cervix   . HPV in female     Past Surgical History Past Surgical History:  Procedure Laterality Date  . COLPOSCOPY     for many yrs per pt    Gynecologic History G1P0  No LMP recorded (lmp unknown). Patient is not currently having periods (Reason: IUD). Contraception: IUD Last Pap: 2017. Results were: abnormal   Obstetric History OB History  Gravida Para Term Preterm AB Living  1         1  SAB TAB Ectopic Multiple Live Births          1    # Outcome Date GA Lbr Len/2nd Weight Sex Delivery Anes PTL Lv  1 Gravida 2007    F Vag-Spont   LIV      Current Medications Current Outpatient Prescriptions on File Prior to Visit  Medication Sig Dispense Refill  . albuterol (PROVENTIL HFA;VENTOLIN HFA) 108 (90 Base) MCG/ACT inhaler Inhale into the lungs.    Marland Kitchen ibuprofen (ADVIL,MOTRIN) 800 MG tablet Take 1 tablet (800 mg total) by mouth every 8 (eight) hours as needed. 60 tablet 1  . lamoTRIgine (LAMICTAL) 100 MG tablet Take 100 mg by mouth 2 (two) times daily.     Marland Kitchen levonorgestrel (MIRENA) 20 MCG/24HR IUD 1 each by  Intrauterine route once.    . lurasidone (LATUDA) 40 MG TABS tablet Take 40 mg by mouth daily with breakfast.     No current facility-administered medications on file prior to visit.     Review of Systems Patient denies any headaches, blurred vision, shortness of breath, chest pain, abdominal pain, problems with bowel movements, urination, or intercourse.  Objective:  BP 104/73   Pulse 81   Ht  (1.676 m)   Wt 215 lb 1.6 oz (97.6 kg)   LMP  (LMP Unknown)   BMI 34.72 kg/m  Physical Exam  General:  Well developed, well nourished, no acute distress. She is alert and oriented x3. Skin:  Warm and dry Neck:  Midline trachea, no thyromegaly or nodules Cardiovascular: Regular rate and rhythm, no murmur heard Lungs:  Effort normal, all lung fields clear to auscultation bilaterally Breasts:  No dominant palpable mass, retraction, or nipple discharge Abdomen:  Soft, non tender, no hepatosplenomegaly or masses Pelvic:  External genitalia is normal in appearance.  The vagina is normal in appearance. The cervix is bulbous, no CMT. IUD string noted.  Thin prep pap is done with HR HPV cotesting. Uterus is felt to be normal size, shape, and contour.  No adnexal masses or tenderness noted. Extremities:  No swelling or varicosities noted Psych:  She has a normal mood and affect  Assessment:  Healthy well-woman exam Obesity IUD check   Plan:  Will try wellbutrin  with Bontril  daily (in place of Contrave). B12 given after lab draw-will RTC 4 weeks for wt/bp/B12. F/U 1 year for AE and 6 months for repeat pap, or sooner if needed   Adalea Handler Suzan Nailer, CNM

## 2017-01-18 LAB — COMPREHENSIVE METABOLIC PANEL
ALK PHOS: 96 IU/L (ref 39–117)
ALT: 18 IU/L (ref 0–32)
AST: 12 IU/L (ref 0–40)
Albumin/Globulin Ratio: 1.6 (ref 1.2–2.2)
Albumin: 4.7 g/dL (ref 3.5–5.5)
BUN/Creatinine Ratio: 13 (ref 9–23)
BUN: 10 mg/dL (ref 6–20)
Bilirubin Total: 0.3 mg/dL (ref 0.0–1.2)
CO2: 27 mmol/L (ref 18–29)
CREATININE: 0.78 mg/dL (ref 0.57–1.00)
Calcium: 9.5 mg/dL (ref 8.7–10.2)
Chloride: 99 mmol/L (ref 96–106)
GFR calc Af Amer: 111 mL/min/{1.73_m2} (ref >59)
GFR calc non Af Amer: 96 mL/min/{1.73_m2} (ref >59)
GLUCOSE: 98 mg/dL (ref 65–99)
Globulin, Total: 3 g/dL (ref 1.5–4.5)
Potassium: 4.4 mmol/L (ref 3.5–5.2)
Sodium: 141 mmol/L (ref 134–144)
Total Protein: 7.7 g/dL (ref 6.0–8.5)

## 2017-01-18 LAB — THYROID PANEL WITH TSH
Free Thyroxine Index: 1.4 (ref 1.2–4.9)
T3 Uptake Ratio: 22 % — ABNORMAL LOW (ref 24–39)
T4, Total: 6.3 ug/dL (ref 4.5–12.0)
TSH: 4.2 u[IU]/mL (ref 0.450–4.500)

## 2017-01-18 LAB — HEPATITIS C ANTIBODY: Hep C Virus Ab: <0.1 s/co ratio (ref 0.0–0.9)

## 2017-01-18 LAB — B12 AND FOLATE PANEL
FOLATE: 18.2 ng/mL (ref >3.0)
VITAMIN B 12: 671 pg/mL (ref 232–1245)

## 2017-01-18 LAB — HIV ANTIBODY (ROUTINE TESTING W REFLEX): HIV SCREEN 4TH GENERATION: NONREACTIVE

## 2017-01-18 LAB — LIPID PANEL
Chol/HDL Ratio: 4.2 ratio (ref 0.0–4.4)
Cholesterol, Total: 197 mg/dL (ref 100–199)
HDL: 47 mg/dL (ref >39)
LDL CALC: 117 mg/dL — AB (ref 0–99)
Triglycerides: 164 mg/dL — ABNORMAL HIGH (ref 0–149)
VLDL CHOLESTEROL CAL: 33 mg/dL (ref 5–40)

## 2017-01-18 LAB — HEMOGLOBIN A1C
ESTIMATED AVERAGE GLUCOSE: 114 mg/dL
HEMOGLOBIN A1C: 5.6 % (ref 4.8–5.6)

## 2017-01-19 LAB — CYTOLOGY - PAP

## 2017-01-26 ENCOUNTER — Other Ambulatory Visit: Payer: Self-pay | Admitting: Obstetrics and Gynecology

## 2017-02-11 ENCOUNTER — Other Ambulatory Visit: Payer: Self-pay | Admitting: *Deleted

## 2017-02-14 ENCOUNTER — Ambulatory Visit (INDEPENDENT_AMBULATORY_CARE_PROVIDER_SITE_OTHER): Payer: BLUE CROSS/BLUE SHIELD | Admitting: Obstetrics and Gynecology

## 2017-02-14 VITALS — BP 115/82 | HR 78 | Ht 66.0 in | Wt 210.5 lb

## 2017-02-14 DIAGNOSIS — E669 Obesity, unspecified: Secondary | ICD-10-CM

## 2017-02-14 MED ORDER — CYANOCOBALAMIN 1000 MCG/ML IJ SOLN
1000.0000 ug | Freq: Once | INTRAMUSCULAR | Status: AC
  Administered 2017-02-14: 1000 ug via INTRAMUSCULAR

## 2017-02-14 NOTE — Progress Notes (Signed)
Pt presents for  Weight,B/P, B-12 injection. No side effects of medications-Phentermine or B-12. Weight loss _5___ lbs. Encourage eating healthy and exercise.Swimming for exercise,walking. No side effects. States she is trying to stay away from "white stuff"the patient states she is taking 1/2 pill. Return to office in 4 weeks for next inj.

## 2017-03-14 ENCOUNTER — Ambulatory Visit (INDEPENDENT_AMBULATORY_CARE_PROVIDER_SITE_OTHER): Payer: BLUE CROSS/BLUE SHIELD | Admitting: Obstetrics and Gynecology

## 2017-03-14 VITALS — BP 109/81 | HR 78 | Ht 66.0 in | Wt 216.6 lb

## 2017-03-14 DIAGNOSIS — E669 Obesity, unspecified: Secondary | ICD-10-CM | POA: Diagnosis not present

## 2017-03-14 MED ORDER — CYANOCOBALAMIN 1000 MCG/ML IJ SOLN
1000.0000 ug | Freq: Once | INTRAMUSCULAR | Status: AC
  Administered 2017-03-14: 1000 ug via INTRAMUSCULAR

## 2017-03-14 NOTE — Progress Notes (Signed)
Patient ID: Lisa Schroeder, female   DOB: November 12, 1975, 41 y.o.   MRN: [id number] Pt presents for weight, B/P, B-12 injection. No side effects of medication- B-12.  Weight gain of 6 lbs. Encouraged eating healthy and exercise. Pt did not take phentermine this month at all. She stated she did not like the way it made her fill. Continues with Wellbutrin. Pt states she is going to see her psychiatrist about all of her medications today. She would like to continue her B12 injection. She does have one more. They make her feel better. Encouraged to make an appt with [facility] after her visit with psychiatrist, depending on the outcome there.

## 2017-04-11 ENCOUNTER — Ambulatory Visit: Payer: BLUE CROSS/BLUE SHIELD | Admitting: Obstetrics and Gynecology

## 2017-05-12 ENCOUNTER — Other Ambulatory Visit: Payer: Self-pay | Admitting: Obstetrics and Gynecology

## 2017-06-04 ENCOUNTER — Other Ambulatory Visit: Payer: Self-pay | Admitting: Obstetrics and Gynecology

## 2017-07-22 ENCOUNTER — Encounter: Payer: Self-pay | Admitting: Certified Nurse Midwife

## 2017-07-22 ENCOUNTER — Ambulatory Visit (INDEPENDENT_AMBULATORY_CARE_PROVIDER_SITE_OTHER): Payer: BLUE CROSS/BLUE SHIELD | Admitting: Certified Nurse Midwife

## 2017-07-22 VITALS — BP 116/86 | HR 83 | Ht 66.0 in | Wt 227.9 lb

## 2017-07-22 DIAGNOSIS — R87611 Atypical squamous cells cannot exclude high grade squamous intraepithelial lesion on cytologic smear of cervix (ASC-H): Secondary | ICD-10-CM | POA: Diagnosis not present

## 2017-07-22 NOTE — Progress Notes (Signed)
GYN ENCOUNTER NOTE  Subjective:       Lisa Schroeder is a 41 y.o. G1P0 female is here for gynecologic evaluation of the following issues:  1. Repeat pap.    Gynecologic History No LMP recorded (lmp unknown). Patient is not currently having periods (Reason: IUD). Contraception: IUD, expired  Last Pap: 01/17/2017. Results were: abnormal ATYPICAL SQUAMOUS CELLS OF UNDETERMINED SIGNIFICANCE (ASC-US).  Obstetric History OB History  Gravida Para Term Preterm AB Living  1         1  SAB TAB Ectopic Multiple Live Births          1    # Outcome Date GA Lbr Len/2nd Weight Sex Delivery Anes PTL Lv  1 Gravida 2007    F Vag-Spont   LIV      Past Medical History:  Diagnosis Date  . Abnormal Pap smear of cervix   . Depression   . HPV in female     Past Surgical History:  Procedure Laterality Date  . COLPOSCOPY     for many yrs per pt    Current Outpatient Prescriptions on File Prior to Visit  Medication Sig Dispense Refill  . lamoTRIgine (LAMICTAL) 100 MG tablet Take 100 mg by mouth 2 (two) times daily.     Marland Kitchen levonorgestrel (MIRENA) 20 MCG/24HR IUD 1 each by Intrauterine route once.     No current facility-administered medications on file prior to visit.     No Known Allergies  Social History   Social History  . Marital status: Divorced    Spouse name: N/A  . Number of children: N/A  . Years of education: N/A   Occupational History  . Not on file.   Social History Main Topics  . Smoking status: Never Smoker  . Smokeless tobacco: Never Used  . Alcohol use Yes     Comment: rare  . Drug use: No  . Sexual activity: Yes    Birth control/ protection: IUD     Comment: mirena   Other Topics Concern  . Not on file   Social History Narrative  . No narrative on file    Family History  Problem Relation Age of Onset  . Thyroid disease Mother   . Diabetes Father   . Heart disease Father   . Diabetes Paternal Grandfather   . Heart disease Paternal Grandfather      The following portions of the patient's history were reviewed and updated as appropriate: allergies, current medications, past family history, past medical history, past social history, past surgical history and problem list.  Review of Systems Review of Systems - Negative except as mentioned in HPI Review of Systems - General ROS: negative for - chills, fatigue, fever, hot flashes, malaise or night sweats Hematological and Lymphatic ROS: negative for - bleeding problems or swollen lymph nodes Gastrointestinal ROS: negative for - abdominal pain, blood in stools, change in bowel habits and nausea/vomiting Musculoskeletal ROS: negative for - joint pain, muscle pain or muscular weakness Genito-Urinary ROS: negative for - change in menstrual cycle, dysmenorrhea, dyspareunia, dysuria, genital discharge, genital ulcers, hematuria, incontinence, irregular/heavy menses, nocturia or pelvic pain  Objective:   BP 116/86   Pulse 83   Ht 5\' 6"  (1.676 m)   Wt 227 lb 14.4 oz (103.4 kg)   LMP  (LMP Unknown)   BMI 36.78 kg/m  CONSTITUTIONAL: Well-developed, well-nourished, obese female in no acute distress.  HENT:  Normocephalic, atraumatic.  NECK: Normal range of motion, SKIN: Skin is  warm and dry. No rash noted. Not diaphoretic. No erythema. No pallor. NEUROLGIC: Alert and oriented to person, place, and time.  PSYCHIATRIC: Normal mood and affect. Normal behavior. Normal judgment and thought content. CARDIOVASCULAR:Not Examined RESPIRATORY: Not Examined BREASTS: Not Examined ABDOMEN: Soft, non distended; Non tender.  No Organomegaly. PELVIC:  External Genitalia: Normal  BUS: Normal  Vagina: Normal  Cervix: Normal, strings visualized, contact bleeding  MUSCULOSKELETAL: Normal range of motion. No tenderness.  No cyanosis, clubbing, or edema.   Assessment:   1. Atypical squamous cells cannot exclude high grade squamous intraepithelial lesion on cytologic smear of cervix (ASC-H)  - IGP,  [id number]     Plan:   Will follow up with result. Return for annual or prn.   Doreene BurkeAnnie Denham Mose, CNM

## 2017-07-22 NOTE — Patient Instructions (Signed)
Preventive Care 18-39 Years, Female Preventive care refers to lifestyle choices and visits with your health care provider that can promote health and wellness. What does preventive care include?  A yearly physical exam. This is also called an annual well check.  Dental exams once or twice a year.  Routine eye exams. Ask your health care provider how often you should have your eyes checked.  Personal lifestyle choices, including: ? Daily care of your teeth and gums. ? Regular physical activity. ? Eating a healthy diet. ? Avoiding tobacco and drug use. ? Limiting alcohol use. ? Practicing safe sex. ? Taking vitamin and mineral supplements as recommended by your health care provider. What happens during an annual well check? The services and screenings done by your health care provider during your annual well check will depend on your age, overall health, lifestyle risk factors, and family history of disease. Counseling Your health care provider may ask you questions about your:  Alcohol use.  Tobacco use.  Drug use.  Emotional well-being.  Home and relationship well-being.  Sexual activity.  Eating habits.  Work and work Statistician.  Method of birth control.  Menstrual cycle.  Pregnancy history.  Screening You may have the following tests or measurements:  Height, weight, and BMI.  Diabetes screening. This is done by checking your blood sugar (glucose) after you have not eaten for a while (fasting).  Blood pressure.  Lipid and cholesterol levels. These may be checked every 5 years starting at age 66.  Skin check.  Hepatitis C blood test.  Hepatitis B blood test.  Sexually transmitted disease (STD) testing.  BRCA-related cancer screening. This may be done if you have a family history of breast, ovarian, tubal, or peritoneal cancers.  Pelvic exam and Pap test. This may be done every 3 years starting at age 40. Starting at age 59, this may be done every 5  years if you have a Pap test in combination with an HPV test.  Discuss your test results, treatment options, and if necessary, the need for more tests with your health care provider. Vaccines Your health care provider may recommend certain vaccines, such as:  Influenza vaccine. This is recommended every year.  Tetanus, diphtheria, and acellular pertussis (Tdap, Td) vaccine. You may need a Td booster every 10 years.  Varicella vaccine. You may need this if you have not been vaccinated.  HPV vaccine. If you are 69 or younger, you may need three doses over 6 months.  Measles, mumps, and rubella (MMR) vaccine. You may need at least one dose of MMR. You may also need a second dose.  Pneumococcal 13-valent conjugate (PCV13) vaccine. You may need this if you have certain conditions and were not previously vaccinated.  Pneumococcal polysaccharide (PPSV23) vaccine. You may need one or two doses if you smoke cigarettes or if you have certain conditions.  Meningococcal vaccine. One dose is recommended if you are age 27-21 years and a first-year college student living in a residence hall, or if you have one of several medical conditions. You may also need additional booster doses.  Hepatitis A vaccine. You may need this if you have certain conditions or if you travel or work in places where you may be exposed to hepatitis A.  Hepatitis B vaccine. You may need this if you have certain conditions or if you travel or work in places where you may be exposed to hepatitis B.  Haemophilus influenzae type b (Hib) vaccine. You may need this if  you have certain risk factors.  Talk to your health care provider about which screenings and vaccines you need and how often you need them. This information is not intended to replace advice given to you by your health care provider. Make sure you discuss any questions you have with your health care provider. Document Released: 11/09/2001 Document Revised: 06/02/2016  Document Reviewed: 07/15/2015 Elsevier Interactive Patient Education  2017 [employer].

## 2017-07-27 ENCOUNTER — Encounter: Payer: Self-pay | Admitting: Certified Nurse Midwife

## 2017-07-27 LAB — IGP, COBASHPV16/18
HPV 16: NEGATIVE
HPV 18: NEGATIVE
HPV other hr types: POSITIVE — AB
PAP Smear Comment: 0

## 2017-10-03 ENCOUNTER — Encounter: Payer: Self-pay | Admitting: Certified Nurse Midwife

## 2017-10-03 ENCOUNTER — Ambulatory Visit (INDEPENDENT_AMBULATORY_CARE_PROVIDER_SITE_OTHER): Payer: BLUE CROSS/BLUE SHIELD | Admitting: Certified Nurse Midwife

## 2017-10-03 VITALS — BP 118/84 | HR 85 | Wt 244.0 lb

## 2017-10-03 DIAGNOSIS — Z30432 Encounter for removal of intrauterine contraceptive device: Secondary | ICD-10-CM

## 2017-10-03 MED ORDER — NORGESTIM-ETH ESTRAD TRIPHASIC 0.18/0.215/0.25 MG-25 MCG PO TABS: 1.0000 | ORAL_TABLET | Freq: Every day | ORAL | 11 refills | Status: DC

## 2017-10-03 NOTE — Progress Notes (Signed)
  GYNECOLOGY OFFICE PROCEDURE NOTE  Lisa Schroeder is a 42 y.o. G1P0 here for Mirena IUD removal. She states that her boyfriend is getting a vasectomy. She will use the pill in the interim . No GYN concerns.  Last pap smear was on 07/22/17 and was normal.  IUD Removal  Patient identified, informed consent performed, consent signed.  Patient was in the dorsal lithotomy position, normal external genitalia was noted.  A speculum was placed in the patient's vagina, normal discharge was noted, no lesions. The cervix was visualized, no lesions, no abnormal discharge.  The strings of the IUD were grasped and pulled using ring forceps. The IUD was removed in its entirety.  Patient tolerated the procedure well.    Patient will use Norgestimate-Ethinyl Estradiol Triphasic 0.18/0.215/0.25 MG-25 MCG tab for contraception.  Routine preventative health maintenance measures emphasized.   Doreene BurkeAnnie Cherita Hebel, CNM

## 2017-10-03 NOTE — Patient Instructions (Addendum)
Oral Contraception Use Oral contraceptive pills (OCPs) are medicines taken to prevent pregnancy. OCPs work by preventing the ovaries from releasing eggs. The hormones in OCPs also cause the cervical mucus to thicken, preventing the sperm from entering the uterus. The hormones also cause the uterine lining to become thin, not allowing a fertilized egg to attach to the inside of the uterus. OCPs are highly effective when taken exactly as prescribed. However, OCPs do not prevent sexually transmitted diseases (STDs). Safe sex practices, such as using condoms along with an OCP, can help prevent STDs. Before taking OCPs, you may have a physical exam and Pap test. Your health care provider may also order blood tests if necessary. Your health care provider will make sure you are a good candidate for oral contraception. Discuss with your health care provider the possible side effects of the OCP you may be prescribed. When starting an OCP, it can take 2 to 3 months for the body to adjust to the changes in hormone levels in your body. How to take oral contraceptive pills Your health care provider may advise you on how to start taking the first cycle of OCPs. Otherwise, you can:  Start on day 1 of your menstrual period. You will not need any backup contraceptive protection with this start time.  Start on the first Sunday after your menstrual period or the day you get your prescription. In these cases, you will need to use backup contraceptive protection for the first week.  Start the pill at any time of your cycle. If you take the pill within 5 days of the start of your period, you are protected against pregnancy right away. In this case, you will not need a backup form of birth control. If you start at any other time of your menstrual cycle, you will need to use another form of birth control for 7 days. If your OCP is the type called a minipill, it will protect you from pregnancy after taking it for 2 days (48  hours).  After you have started taking OCPs:  If you forget to take 1 pill, take it as soon as you remember. Take the next pill at the regular time.  If you miss 2 or more pills, call your health care provider because different pills have different instructions for missed doses. Use backup birth control until your next menstrual period starts.  If you use a 28-day pack that contains inactive pills and you miss 1 of the last 7 pills (pills with no hormones), it will not matter. Throw away the rest of the non-hormone pills and start a new pill pack.  No matter which day you start the OCP, you will always start a new pack on that same day of the week. Have an extra pack of OCPs and a backup contraceptive method available in case you miss some pills or lose your OCP pack. Follow these instructions at home:  Do not smoke.  Always use a condom to protect against STDs. OCPs do not protect against STDs.  Use a calendar to mark your menstrual period days.  Read the information and directions that came with your OCP. Talk to your health care provider if you have questions. Contact a health care provider if:  You develop nausea and vomiting.  You have abnormal vaginal discharge or bleeding.  You develop a rash.  You miss your menstrual period.  You are losing your hair.  You need treatment for mood swings or depression.  You   get dizzy when taking the OCP.  You develop acne from taking the OCP.  You become pregnant. Get help right away if:  You develop chest pain.  You develop shortness of breath.  You have an uncontrolled or severe headache.  You develop numbness or slurred speech.  You develop visual problems.  You develop pain, redness, and swelling in the legs. This information is not intended to replace advice given to you by your health care provider. Make sure you discuss any questions you have with your health care provider. Document Released: 09/02/2011 Document  Revised: 02/19/2016 Document Reviewed: 03/04/2013 Elsevier Interactive Patient Education  2017 Else[employer] Inc[employer]  

## 2017-11-11 ENCOUNTER — Ambulatory Visit: Payer: BLUE CROSS/BLUE SHIELD | Admitting: Obstetrics and Gynecology

## 2018-06-10 ENCOUNTER — Encounter: Payer: Self-pay | Admitting: *Deleted

## 2018-06-30 ENCOUNTER — Ambulatory Visit: Payer: BLUE CROSS/BLUE SHIELD | Admitting: Psychiatry

## 2018-06-30 DIAGNOSIS — F902 Attention-deficit hyperactivity disorder, combined type: Secondary | ICD-10-CM | POA: Diagnosis not present

## 2018-06-30 DIAGNOSIS — F319 Bipolar disorder, unspecified: Secondary | ICD-10-CM | POA: Diagnosis not present

## 2018-06-30 MED ORDER — METHYLPHENIDATE HCL ER (CD) 20 MG PO CPCR: 20.0000 mg | ORAL_CAPSULE | ORAL | 0 refills | Status: DC

## 2018-06-30 NOTE — Patient Instructions (Signed)
Call if problems with the new stimulant.  Call if mood swings.

## 2018-06-30 NOTE — Progress Notes (Signed)
Lisa Schroeder [id number] [birth year] 42 y.o.  Subjective:   Patient ID:  Lisa Schroeder is a 42 y.o. (DOB Jul 22, 1976) female.  Chief Complaint:  Chief Complaint  Patient presents with  . ADD    HPI Lisa Schroeder presents to the office today for follow-up of bipolar disorder, ADD, hx of bulimia. Would like longer acting version of [facility].  Started therapist lately.  Medications: I have reviewed the patient's current medications. ARIPiprazole (ABILIFY) 5 MG tablet 5 mg, Daily             lamoTRIgine (LAMICTAL) 100 MG tablet 100 mg, 2 times daily       levonorgestrel (MIRENA) 20 MCG/24HR IUD 1 each, Once       LORazepam (ATIVAN) 0.5 MG tablet 0.5 mg, Every 8 hours       methylphenidate (RITALIN) 10 MG tablet 10 mg, 2 times daily       Melatonin 3mg  helps  No SE Allergies: No Known Allergies  Past Medical History:  Diagnosis Date  . Abnormal Pap smear of cervix   . Depression   . HPV in female     Past Surgical History:  Procedure Laterality Date  . COLPOSCOPY     for many yrs per pt    Family History  Problem Relation Age of Onset  . Thyroid disease Mother   . Diabetes Father   . Heart disease Father   . Diabetes Paternal Grandfather   . Heart disease Paternal Grandfather     Social History   Socioeconomic History  . Marital status: Divorced    Spouse name: Not on file  . Number of children: Not on file  . Years of education: Not on file  . Highest education level: Not on file  Occupational History  . Not on file  Social Needs  . Financial resource strain: Not on file  . Food insecurity:    Worry: Not on file    Inability: Not on file  . Transportation needs:    Medical: Not on file    Non-medical: Not on file  Tobacco Use  . Smoking status: Never Smoker  . Smokeless tobacco: Never Used  Substance and Sexual Activity  . Alcohol use: Yes    Comment: rare  . Drug use: No  . Sexual activity: Yes    Birth control/protection: IUD    Comment:  mirena  Lifestyle  . Physical activity:    Days per week: Not on file    Minutes per session: Not on file  . Stress: Not on file  Relationships  . Social connections:    Talks on phone: Not on file    Gets together: Not on file    Attends religious service: Not on file    Active member of club or organization: Not on file    Attends meetings of clubs or organizations: Not on file    Relationship status: Not on file  . Intimate partner violence:    Fear of current or ex partner: Not on file    Emotionally abused: Not on file    Physically abused: Not on file    Forced sexual activity: Not on file  Other Topics Concern  . Not on file  Social History Narrative  . Not on file    Past Medical History, Surgical history, Social history, and Family history were reviewed and updated as appropriate.   Please see review of systems for further details on the patient's review from today.  Review of Systems:  Review of Systems  Constitutional: Positive for unexpected weight change.  Psychiatric/Behavioral: Positive for decreased concentration. Negative for agitation, behavioral problems, confusion, dysphoric mood, hallucinations, self-injury, sleep disturbance and suicidal ideas. The patient is not nervous/anxious and is not hyperactive.     Objective:   Physical Exam:  There were no vitals taken for this visit.  Physical Exam  Constitutional: She appears well-developed and well-nourished.  Psychiatric: She has a normal mood and affect. Her speech is normal and behavior is normal. Judgment and thought content normal. She is not actively hallucinating. Cognition and memory are normal. She is attentive.    Lab Review:     Component Value Date/Time   NA 141 01/17/2017 1714   K 4.4 01/17/2017 1714   CL 99 01/17/2017 1714   CO2 27 01/17/2017 1714   GLUCOSE 98 01/17/2017 1714   BUN 10 01/17/2017 1714   CREATININE 0.78 01/17/2017 1714   CALCIUM 9.5 01/17/2017 1714   PROT 7.7  01/17/2017 1714   ALBUMIN 4.7 01/17/2017 1714   AST 12 01/17/2017 1714   ALT 18 01/17/2017 1714   ALKPHOS 96 01/17/2017 1714   BILITOT 0.3 01/17/2017 1714   GFRNONAA 96 01/17/2017 1714   GFRAA 111 01/17/2017 1714    No results found for: WBC, RBC, HGB, HCT, PLT, MCV, MCH, MCHC, RDW, LYMPHSABS, MONOABS, EOSABS, BASOSABS   Assessment: Plan:    Attention deficit hyperactivity disorder (ADHD), combined type  Bipolar I disorder (HCC)   Change to LA stimulant.  Discussed differences in types duration and SE.  Wants help through whole work day.  Eat protein to prevent craving late day. Discussed risk mania with stimulants. Protect sleep  FU 3  months.    Please see After Visit Summary for patient specific instructions.  No future appointments.  No orders of the defined types were placed in this encounter.     -------------------------------

## 2018-08-04 ENCOUNTER — Encounter: Payer: BLUE CROSS/BLUE SHIELD | Admitting: Certified Nurse Midwife

## 2018-08-11 ENCOUNTER — Ambulatory Visit (INDEPENDENT_AMBULATORY_CARE_PROVIDER_SITE_OTHER): Payer: BLUE CROSS/BLUE SHIELD | Admitting: Certified Nurse Midwife

## 2018-08-11 ENCOUNTER — Other Ambulatory Visit (HOSPITAL_COMMUNITY)
Admission: RE | Admit: 2018-08-11 | Discharge: 2018-08-11 | Disposition: A | Discharge status: Home / Self Care | Payer: BLUE CROSS/BLUE SHIELD | Source: Ambulatory Visit | Attending: Certified Nurse Midwife | Admitting: Certified Nurse Midwife

## 2018-08-11 ENCOUNTER — Encounter: Payer: Self-pay | Admitting: Certified Nurse Midwife

## 2018-08-11 VITALS — BP 113/79 | HR 76 | Ht 66.0 in | Wt 236.4 lb

## 2018-08-11 DIAGNOSIS — Z1231 Encounter for screening mammogram for malignant neoplasm of breast: Secondary | ICD-10-CM

## 2018-08-11 DIAGNOSIS — Z01419 Encounter for gynecological examination (general) (routine) without abnormal findings: Secondary | ICD-10-CM

## 2018-08-11 DIAGNOSIS — Z124 Encounter for screening for malignant neoplasm of cervix: Secondary | ICD-10-CM

## 2018-08-11 MED ORDER — NORGESTIM-ETH ESTRAD TRIPHASIC 0.18/0.215/0.25 MG-25 MCG PO TABS: 1.0000 | ORAL_TABLET | Freq: Every day | ORAL | 11 refills | Status: DC

## 2018-08-11 NOTE — Progress Notes (Signed)
GYNECOLOGY ANNUAL PREVENTATIVE CARE ENCOUNTER NOTE  Subjective:   Lisa Schroeder is a 42 y.o. G1P0 female here for a routine annual gynecologic exam.  Current complaints: none.   Denies abnormal vaginal bleeding, discharge, pelvic pain, problems with intercourse or other gynecologic concerns.    Gynecologic History Contraception: OCP (estrogen/progesterone) Last Pap:  10/26/2018Results were: negative with positive  HPV Last mammogram:12/20/08  Results were: normal  Obstetric History OB History  Gravida Para Term Preterm AB Living  1         1  SAB TAB Ectopic Multiple Live Births          1    # Outcome Date GA Lbr Len/2nd Weight Sex Delivery Anes PTL Lv  1 Gravida 2007    F Vag-Spont   LIV    Past Medical History:  Diagnosis Date  . Abnormal Pap smear of cervix   . Depression   . HPV in female     Past Surgical History:  Procedure Laterality Date  . COLPOSCOPY     for many yrs per pt    Current Outpatient Medications on File Prior to Visit  Medication Sig Dispense Refill  . ARIPiprazole (ABILIFY) 5 MG tablet Take 5 mg by mouth daily.    Marland Kitchen lamoTRIgine (LAMICTAL) 100 MG tablet Take 100 mg by mouth 2 (two) times daily.     Marland Kitchen levonorgestrel (MIRENA) 20 MCG/24HR IUD 1 each by Intrauterine route once.    Marland Kitchen LORazepam (ATIVAN) 0.5 MG tablet Take 0.5 mg by mouth every 8 (eight) hours.    . methylphenidate (METADATE CD) 20 MG CR capsule Take 1 capsule (20 mg total) by mouth every morning. 30 capsule 0  . Norgestimate-Ethinyl Estradiol Triphasic 0.18/0.215/0.25 MG-25 MCG tab Take 1 tablet by mouth daily. (Patient not taking: Reported on 06/30/2018) 1 Package 11   No current facility-administered medications on file prior to visit.     No Known Allergies  Social History:  reports that she has never smoked. She has never used smokeless tobacco. She reports that she drinks alcohol. She reports that she does not use drugs.  Recently started exercising and has lost 6 pounds. Is  doing weight loss changes with work colleges.   Family History  Problem Relation Age of Onset  . Thyroid disease Mother   . Diabetes Father   . Heart disease Father   . Diabetes Paternal Grandfather   . Heart disease Paternal Grandfather     The following portions of the patient's history were reviewed and updated as appropriate: allergies, current medications, past family history, past medical history, past social history, past surgical history and problem list.  Review of Systems Pertinent items noted in HPI and remainder of comprehensive ROS otherwise negative.   Objective:  There were no vitals taken for this visit. CONSTITUTIONAL: Well-developed, well-nourished, over weight female in no acute distress.  HENT:  Normocephalic, atraumatic, External right and left ear normal. Oropharynx is clear and moist EYES: Conjunctivae and EOM are normal. Pupils are equal, round, and reactive to light. No scleral icterus.  NECK: Normal range of motion, supple, no masses.  Normal thyroid.  SKIN: Skin is warm and dry. No rash noted. Not diaphoretic. No erythema. No pallor. MUSCULOSKELETAL: Normal range of motion. No tenderness.  No cyanosis, clubbing, or edema.  2+ distal pulses. NEUROLOGIC: Alert and oriented to person, place, and time. Normal reflexes, muscle tone coordination. No cranial nerve deficit noted. PSYCHIATRIC: Normal mood and affect. Normal behavior. Normal judgment  and thought content. CARDIOVASCULAR: Normal heart rate noted, regular rhythm RESPIRATORY: Clear to auscultation bilaterally. Effort and breath sounds normal, no problems with respiration noted. BREASTS: Symmetric in size. No masses, skin changes, nipple drainage, or lymphadenopathy. ABDOMEN: Soft, normal bowel sounds, no distention noted.  No tenderness, rebound or guarding.  PELVIC: Normal appearing external genitalia; normal appearing vaginal mucosa and cervix. . No abnormal discharge noted.  Pap smear obtained. Contract  bleeding present. Normal uterine size, no other palpable masses, no uterine or adnexal tenderness.    Assessment and Plan:  Annual Well Women Exam  Will follow up results of pap smear and manage accordingly. Mammogram ordered Pt states that her partner got vasectomy but would like to stay on the pill because it helps regulate her cycle. Refill ordered.  Routine preventative health maintenance measures emphasized. Please refer to After Visit Summary for other counseling recommendations.    Doreene BurkeAnnie Marquee Fuchs, CNM

## 2018-08-11 NOTE — Patient Instructions (Signed)
Preventive Care 40-64 Years, Female Preventive care refers to lifestyle choices and visits with your health care provider that can promote health and wellness. What does preventive care include?  A yearly physical exam. This is also called an annual well check.  Dental exams once or twice a year.  Routine eye exams. Ask your health care provider how often you should have your eyes checked.  Personal lifestyle choices, including: ? Daily care of your teeth and gums. ? Regular physical activity. ? Eating a healthy diet. ? Avoiding tobacco and drug use. ? Limiting alcohol use. ? Practicing safe sex. ? Taking low-dose aspirin daily starting at age 58. ? Taking vitamin and mineral supplements as recommended by your health care provider. What happens during an annual well check? The services and screenings done by your health care provider during your annual well check will depend on your age, overall health, lifestyle risk factors, and family history of disease. Counseling Your health care provider may ask you questions about your:  Alcohol use.  Tobacco use.  Drug use.  Emotional well-being.  Home and relationship well-being.  Sexual activity.  Eating habits.  Work and work Statistician.  Method of birth control.  Menstrual cycle.  Pregnancy history.  Screening You may have the following tests or measurements:  Height, weight, and BMI.  Blood pressure.  Lipid and cholesterol levels. These may be checked every 5 years, or more frequently if you are over 81 years old.  Skin check.  Lung cancer screening. You may have this screening every year starting at age 78 if you have a 30-pack-year history of smoking and currently smoke or have quit within the past 15 years.  Fecal occult blood test (FOBT) of the stool. You may have this test every year starting at age 65.  Flexible sigmoidoscopy or colonoscopy. You may have a sigmoidoscopy every 5 years or a colonoscopy  every 10 years starting at age 30.  Hepatitis C blood test.  Hepatitis B blood test.  Sexually transmitted disease (STD) testing.  Diabetes screening. This is done by checking your blood sugar (glucose) after you have not eaten for a while (fasting). You may have this done every 1-3 years.  Mammogram. This may be done every 1-2 years. Talk to your health care provider about when you should start having regular mammograms. This may depend on whether you have a family history of breast cancer.  BRCA-related cancer screening. This may be done if you have a family history of breast, ovarian, tubal, or peritoneal cancers.  Pelvic exam and Pap test. This may be done every 3 years starting at age 80. Starting at age 36, this may be done every 5 years if you have a Pap test in combination with an HPV test.  Bone density scan. This is done to screen for osteoporosis. You may have this scan if you are at high risk for osteoporosis.  Discuss your test results, treatment options, and if necessary, the need for more tests with your health care provider. Vaccines Your health care provider may recommend certain vaccines, such as:  Influenza vaccine. This is recommended every year.  Tetanus, diphtheria, and acellular pertussis (Tdap, Td) vaccine. You may need a Td booster every 10 years.  Varicella vaccine. You may need this if you have not been vaccinated.  Zoster vaccine. You may need this after age 5.  Measles, mumps, and rubella (MMR) vaccine. You may need at least one dose of MMR if you were born in  1957 or later. You may also need a second dose.  Pneumococcal 13-valent conjugate (PCV13) vaccine. You may need this if you have certain conditions and were not previously vaccinated.  Pneumococcal polysaccharide (PPSV23) vaccine. You may need one or two doses if you smoke cigarettes or if you have certain conditions.  Meningococcal vaccine. You may need this if you have certain  conditions.  Hepatitis A vaccine. You may need this if you have certain conditions or if you travel or work in places where you may be exposed to hepatitis A.  Hepatitis B vaccine. You may need this if you have certain conditions or if you travel or work in places where you may be exposed to hepatitis B.  Haemophilus influenzae type b (Hib) vaccine. You may need this if you have certain conditions.  Talk to your health care provider about which screenings and vaccines you need and how often you need them. This information is not intended to replace advice given to you by your health care provider. Make sure you discuss any questions you have with your health care provider. Document Released: 10/10/2015 Document Revised: 06/02/2016 Document Reviewed: 07/15/2015 Elsevier Interactive Patient Education  2018 Elsevier Inc[employer]  

## 2018-08-16 LAB — CYTOLOGY - PAP
Diagnosis: NEGATIVE
HPV (WINDOPATH): DETECTED — AB
HPV 16/18/45 GENOTYPING: NEGATIVE

## 2018-09-15 ENCOUNTER — Ambulatory Visit
Admission: RE | Admit: 2018-09-15 | Discharge: 2018-09-15 | Disposition: A | Discharge status: Home / Self Care | Payer: BLUE CROSS/BLUE SHIELD | Source: Ambulatory Visit | Attending: Certified Nurse Midwife | Admitting: Certified Nurse Midwife

## 2018-09-15 DIAGNOSIS — Z1231 Encounter for screening mammogram for malignant neoplasm of breast: Secondary | ICD-10-CM

## 2018-09-17 ENCOUNTER — Encounter: Payer: Self-pay | Admitting: Emergency Medicine

## 2018-09-17 DIAGNOSIS — F3181 Bipolar II disorder: Secondary | ICD-10-CM | POA: Insufficient documentation

## 2018-09-17 DIAGNOSIS — F988 Other specified behavioral and emotional disorders with onset usually occurring in childhood and adolescence: Secondary | ICD-10-CM | POA: Insufficient documentation

## 2018-10-06 ENCOUNTER — Ambulatory Visit: Payer: BLUE CROSS/BLUE SHIELD | Admitting: Psychiatry

## 2018-10-06 ENCOUNTER — Encounter: Payer: Self-pay | Admitting: Psychiatry

## 2018-10-06 VITALS — BP 153/90 | HR 102

## 2018-10-06 DIAGNOSIS — F319 Bipolar disorder, unspecified: Secondary | ICD-10-CM | POA: Diagnosis not present

## 2018-10-06 DIAGNOSIS — F902 Attention-deficit hyperactivity disorder, combined type: Secondary | ICD-10-CM

## 2018-10-06 MED ORDER — LAMOTRIGINE 100 MG PO TABS: 200.0000 mg | ORAL_TABLET | Freq: Every day | ORAL | 1 refills | Status: DC

## 2018-10-06 MED ORDER — METHYLPHENIDATE HCL ER (CD) 20 MG PO CPCR: 20.0000 mg | ORAL_CAPSULE | ORAL | 0 refills | Status: DC

## 2018-10-06 MED ORDER — ARIPIPRAZOLE 5 MG PO TABS: 5.0000 mg | ORAL_TABLET | Freq: Every day | ORAL | 0 refills | Status: DC

## 2018-10-06 NOTE — Progress Notes (Signed)
Lisa Schroeder [id number] 1975-10-08 43 y.o.  Subjective:   Patient ID:  Lisa Schroeder is a 43 y.o. (DOB 02-Oct-1975) female.  Chief Complaint:  Chief Complaint  Patient presents with  . Follow-up    Medication management    HPI Lisa Schroeder presents to the office today for follow-up of ADD and mood.  Last visit changed to Metadate CD and that worked well.  Better energy and focus and more productive.  Tolerated well.  Ran out.  Dose seems good.  Patient reports stable mood and denies depressed or irritable moods.  Patient denies any recent difficulty with anxiety.  Patient denies difficulty with sleep initiation or maintenance. Denies appetite disturbance.  Patient reports that energy and motivation have been good.  Patient denies any difficulty with concentration.  Patient denies any suicidal ideation.   Review of Systems:  Review of Systems  Neurological: Negative for tremors and weakness.  Psychiatric/Behavioral: Negative for agitation, behavioral problems, confusion, decreased concentration, dysphoric mood, hallucinations, self-injury, sleep disturbance and suicidal ideas. The patient is not nervous/anxious and is not hyperactive.     Medications: I have reviewed the patient's current medications.  Current Outpatient Medications  Medication Sig Dispense Refill  . ARIPiprazole (ABILIFY) 5 MG tablet Take 5 mg by mouth daily.    Marland Kitchen lamoTRIgine (LAMICTAL) 100 MG tablet Take 200 mg by mouth daily.     . methylphenidate (METADATE CD) 20 MG CR capsule Take 20 mg by mouth every morning.    . Norgestimate-Ethinyl Estradiol Triphasic 0.18/0.215/0.25 MG-25 MCG tab Take 1 tablet by mouth daily. (Patient not taking: Reported on 10/06/2018) 1 Package 11   No current facility-administered medications for this visit.     Medication Side Effects: None  Allergies: No Known Allergies  Past Medical History:  Diagnosis Date  . Abnormal Pap smear of cervix   . Depression   . HPV in  female     Family History  Problem Relation Age of Onset  . Thyroid disease Mother   . Diabetes Father   . Heart disease Father   . Diabetes Paternal Grandfather   . Heart disease Paternal Grandfather     Social History   Socioeconomic History  . Marital status: Divorced    Spouse name: Not on file  . Number of children: Not on file  . Years of education: Not on file  . Highest education level: Not on file  Occupational History  . Not on file  Social Needs  . Financial resource strain: Not on file  . Food insecurity:    Worry: Not on file    Inability: Not on file  . Transportation needs:    Medical: Not on file    Non-medical: Not on file  Tobacco Use  . Smoking status: Never Smoker  . Smokeless tobacco: Never Used  Substance and Sexual Activity  . Alcohol use: Yes    Comment: rare  . Drug use: No  . Sexual activity: Yes    Birth control/protection: None    Comment: vasectomy  Lifestyle  . Physical activity:    Days per week: Not on file    Minutes per session: Not on file  . Stress: Not on file  Relationships  . Social connections:    Talks on phone: Not on file    Gets together: Not on file    Attends religious service: Not on file    Active member of club or organization: Not on file    Attends  meetings of clubs or organizations: Not on file    Relationship status: Not on file  . Intimate partner violence:    Fear of current or ex partner: Not on file    Emotionally abused: Not on file    Physically abused: Not on file    Forced sexual activity: Not on file  Other Topics Concern  . Not on file  Social History Narrative  . Not on file    Past Medical History, Surgical history, Social history, and Family history were reviewed and updated as appropriate.   Please see review of systems for further details on the patient's review from today.   Objective:   Physical Exam:  BP (!) 153/90 (BP Location: Left Arm)   Pulse (!) 102   Physical  Exam Constitutional:      General: She is not in acute distress.    Appearance: She is well-developed.  Musculoskeletal:        General: No deformity.  Neurological:     Mental Status: She is alert and oriented to person, place, and time.     Motor: No tremor.     Coordination: Coordination normal.     Gait: Gait normal.  Psychiatric:        Attention and Perception: Attention and perception normal.        Mood and Affect: Mood is not anxious or depressed. Affect is not labile, blunt, angry or inappropriate.        Speech: Speech normal.        Behavior: Behavior normal.        Thought Content: Thought content normal. Thought content does not include homicidal or suicidal ideation. Thought content does not include homicidal or suicidal plan.        Cognition and Memory: Cognition normal.        Judgment: Judgment normal.     Comments: Insight intact. No auditory or visual hallucinations. No delusions.      Lab Review:     Component Value Date/Time   NA 141 01/17/2017 1714   K 4.4 01/17/2017 1714   CL 99 01/17/2017 1714   CO2 27 01/17/2017 1714   GLUCOSE 98 01/17/2017 1714   BUN 10 01/17/2017 1714   CREATININE 0.78 01/17/2017 1714   CALCIUM 9.5 01/17/2017 1714   PROT 7.7 01/17/2017 1714   ALBUMIN 4.7 01/17/2017 1714   AST 12 01/17/2017 1714   ALT 18 01/17/2017 1714   ALKPHOS 96 01/17/2017 1714   BILITOT 0.3 01/17/2017 1714   GFRNONAA 96 01/17/2017 1714   GFRAA 111 01/17/2017 1714    No results found for: WBC, RBC, HGB, HCT, PLT, MCV, MCH, MCHC, RDW, LYMPHSABS, MONOABS, EOSABS, BASOSABS  No results found for: POCLITH, LITHIUM   No results found for: PHENYTOIN, PHENOBARB, VALPROATE, CBMZ   .res Assessment: Plan:    Attention deficit hyperactivity disorder (ADHD), combined type  Bipolar I disorder (HCC)   Good response to meds.  Likes new ADD med.  Disc fear flying in July coming up.  Asks for bz.  OK lorazepam.  We discussed the short-term risks associated  with benzodiazepines including sedation and increased fall risk among others.  Discussed long-term side effect risk including dependence, potential withdrawal symptoms, and the potential eventual dose-related risk of dementia.  Will not drive at destination.  Ativan 0.5 mg 1-2 before trip if needed.  FU 6 mos  Meredith Staggersarey Cottle, MD, DFAPA  Please see After Visit Summary for patient specific instructions.  Future Appointments  Date Time Provider Department Center  08/17/2019  8:00 AM Doreene Burkehompson, Annie, CNM [facility] None    No orders of the defined types were placed in this encounter.     -------------------------------

## 2018-11-27 ENCOUNTER — Other Ambulatory Visit: Payer: Self-pay | Admitting: Psychiatry

## 2018-11-27 DIAGNOSIS — F319 Bipolar disorder, unspecified: Secondary | ICD-10-CM

## 2018-12-25 ENCOUNTER — Encounter: Payer: Self-pay | Admitting: Emergency Medicine

## 2018-12-25 ENCOUNTER — Emergency Department: Payer: BLUE CROSS/BLUE SHIELD

## 2018-12-25 ENCOUNTER — Emergency Department
Admission: EM | Admit: 2018-12-25 | Discharge: 2018-12-25 | Disposition: A | Discharge status: Home / Self Care | Payer: BLUE CROSS/BLUE SHIELD | Attending: Emergency Medicine | Admitting: Emergency Medicine

## 2018-12-25 ENCOUNTER — Other Ambulatory Visit: Payer: Self-pay

## 2018-12-25 DIAGNOSIS — Z79899 Other long term (current) drug therapy: Secondary | ICD-10-CM | POA: Insufficient documentation

## 2018-12-25 DIAGNOSIS — R079 Chest pain, unspecified: Secondary | ICD-10-CM

## 2018-12-25 LAB — BASIC METABOLIC PANEL
ANION GAP: 11 (ref 5–15)
BUN: 13 mg/dL (ref 6–20)
CO2: 24 mmol/L (ref 22–32)
Calcium: 8.9 mg/dL (ref 8.9–10.3)
Chloride: 104 mmol/L (ref 98–111)
Creatinine, Ser: 0.78 mg/dL (ref 0.44–1.00)
GFR calc Af Amer: >60 mL/min (ref >60)
GFR calc non Af Amer: >60 mL/min (ref >60)
Glucose, Bld: 105 mg/dL — ABNORMAL HIGH (ref 70–99)
Potassium: 3.9 mmol/L (ref 3.5–5.1)
Sodium: 139 mmol/L (ref 135–145)

## 2018-12-25 LAB — CBC
HCT: 42.3 % (ref 36.0–46.0)
Hemoglobin: 13.7 g/dL (ref 12.0–15.0)
MCH: 26.6 pg (ref 26.0–34.0)
MCHC: 32.4 g/dL (ref 30.0–36.0)
MCV: 82.1 fL (ref 80.0–100.0)
PLATELETS: 324 10*3/uL (ref 150–400)
RBC: 5.15 MIL/uL — AB (ref 3.87–5.11)
RDW: 13.1 % (ref 11.5–15.5)
WBC: 10.4 10*3/uL (ref 4.0–10.5)
nRBC: 0 % (ref 0.0–0.2)

## 2018-12-25 LAB — TROPONIN I: Troponin I: <0.03 ng/mL (ref <0.03)

## 2018-12-25 MED ORDER — SODIUM CHLORIDE 0.9% FLUSH
3.0000 mL | Freq: Once | INTRAVENOUS | Status: AC
  Administered 2018-12-25: 3 mL via INTRAVENOUS

## 2018-12-25 NOTE — ED Notes (Signed)
Md at bedside to assess patient. Patient to be discharged to home. IV hep lock s/c, patient awaiting discharge instructions.

## 2018-12-25 NOTE — ED Provider Notes (Signed)
[facility] Emergency Department Provider Note  Time seen: 6:09 PM  I have reviewed the triage vital signs and the nursing notes.   HISTORY  Chief Complaint Chest Pain    HPI Lisa Schroeder is a 43 y.o. female with a past medical history of depression, presents to the emergency department for chest pain.  According to the patient she woke around 10:00 this morning and noticed a pain in the left side of her chest, states it is more of a mild dull pain, she states later today she felt she was feeling some numbness or discomfort in the left arm.  Patient became concerned so she came to the emergency department for evaluation.  Patient denies any shortness of breath, nausea or diaphoresis.  No personal history of cardiac disease, does state her father had a heart attack at 68 years old.  Patient is currently on birth control.  No pleuritic pain.  No leg pain or swelling.  No fever, cough or congestion.   Past Medical History:  Diagnosis Date  . Abnormal Pap smear of cervix   . Depression   . HPV in female     Patient Active Problem List   Diagnosis Date Noted  . ADD (attention deficit disorder) 09/17/2018  . Bipolar II disorder (HCC) 09/17/2018    Past Surgical History:  Procedure Laterality Date  . COLPOSCOPY     for many yrs per pt    Prior to Admission medications   Medication Sig Start Date End Date Taking? Authorizing Provider  ARIPiprazole (ABILIFY) 5 MG tablet TAKE 1 TABLET BY MOUTH EVERY DAY 11/27/18   Cottle, Steva Ready., MD  lamoTRIgine (LAMICTAL) 100 MG tablet Take 2 tablets (200 mg total) by mouth daily. 10/06/18   Cottle, Steva Ready., MD  methylphenidate (METADATE CD) 20 MG CR capsule Take 1 capsule (20 mg total) by mouth every morning. 10/06/18   Cottle, Steva Ready., MD  Norgestimate-Ethinyl Estradiol Triphasic 0.18/0.215/0.25 MG-25 MCG tab Take 1 tablet by mouth daily. Patient not taking: Reported on 10/06/2018 08/11/18   Doreene Burke, CNM     No Known Allergies  Family History  Problem Relation Age of Onset  . Thyroid disease Mother   . Diabetes Father   . Heart disease Father   . Diabetes Paternal Grandfather   . Heart disease Paternal Grandfather     Social History Social History   Tobacco Use  . Smoking status: Never Smoker  . Smokeless tobacco: Never Used  Substance Use Topics  . Alcohol use: Yes    Comment: rare  . Drug use: No    Review of Systems Constitutional: Negative for fever. ENT: Negative for recent illness/congestion Cardiovascular: Mild left-sided chest pain since this morning. Respiratory: Negative for shortness of breath.  Negative for cough. Gastrointestinal: Negative for abdominal pain Musculoskeletal: Negative for leg pain or swelling Skin: Negative for skin complaints  Neurological: Negative for headache All other ROS negative  ____________________________________________   PHYSICAL EXAM:  VITAL SIGNS: ED Triage Vitals  Enc Vitals Group     BP 12/25/18 1659 (!) 144/94     Pulse Rate 12/25/18 1659 83     Resp 12/25/18 1659 16     Temp 12/25/18 1659 98.2 F (36.8 C)     Temp Source 12/25/18 1659 Oral     SpO2 12/25/18 1659 99 %     Weight 12/25/18 1658 235 lb (106.6 kg)     Height 12/25/18 1658 5\' 6"  (1.676  m)     Head Circumference --      Peak Flow --      Pain Score 12/25/18 1657 6     Pain Loc --      Pain Edu? --      Excl. in GC? --    Constitutional: Alert and oriented. Well appearing and in no distress. Eyes: Normal exam ENT   Head: Normocephalic and atraumatic.   Mouth/Throat: Mucous membranes are moist. Cardiovascular: Normal rate, regular rhythm. Respiratory: Normal respiratory effort without tachypnea nor retractions. Breath sounds are clear  Gastrointestinal: Soft and nontender. No distention.  Musculoskeletal: Nontender with normal range of motion in all extremities. No lower extremity tenderness or edema. Neurologic:  Normal speech and  language. No gross focal neurologic deficits Skin:  Skin is warm, dry and intact.  Psychiatric: Mood and affect are normal.   ____________________________________________    EKG  EKG viewed and interpreted by myself shows sinus tachycardia 103 bpm with a narrow QRS, normal axis, normal intervals, no concerning ST changes.  ____________________________________________    RADIOLOGY  Normal chest x-ray  ____________________________________________   INITIAL IMPRESSION / ASSESSMENT AND PLAN / ED COURSE  Pertinent labs & imaging results that were available during my care of the patient were reviewed by me and considered in my medical decision making (see chart for details).  Patient presents to the emergency department for chest discomfort since 10:00 this morning when she awoke.  Patient is not sure if she slept on it wrong, was concerned because she felt like she was having some arm numbness and pain later today so she came to the ER.  Differential would include ACS, anxiety, chest wall pain, pneumonia, pneumothorax.  Reassuringly patient's vitals are normal including a pulse rate around 88 and a 100% room air saturation.  No calf pain or swelling.  Patient's labs are reassuring including a negative troponin.  Chest x-ray is clear and EKG is normal.  Overall the patient appears well and I believe the patient is safe for discharge home.  Patient will follow-up with her primary care doctor.  I discussed my normal chest pain return precautions.  ____________________________________________   FINAL CLINICAL IMPRESSION(S) / ED DIAGNOSES  Chest pain   Minna Antis, MD 12/25/18 [phone]

## 2018-12-25 NOTE — ED Notes (Signed)
Patient reports chest pain that began today with numbness and tingling to left arm. Denies cardiac history. IV access placed to RFA. Xray tech at bedside. Patient off unit to xray. Will monitor upon return.

## 2018-12-25 NOTE — ED Triage Notes (Addendum)
PT c/o CP that started this am with intermit LFT arm tingling. PT appears anxious. VSS, NAD noted

## 2018-12-26 ENCOUNTER — Encounter: Payer: Self-pay | Admitting: Emergency Medicine

## 2018-12-26 ENCOUNTER — Other Ambulatory Visit: Payer: Self-pay

## 2018-12-26 ENCOUNTER — Emergency Department
Admission: EM | Admit: 2018-12-26 | Discharge: 2018-12-26 | Disposition: A | Discharge status: Home / Self Care | Payer: BLUE CROSS/BLUE SHIELD | Attending: Emergency Medicine | Admitting: Emergency Medicine

## 2018-12-26 DIAGNOSIS — R197 Diarrhea, unspecified: Secondary | ICD-10-CM | POA: Insufficient documentation

## 2018-12-26 DIAGNOSIS — M79602 Pain in left arm: Secondary | ICD-10-CM | POA: Diagnosis not present

## 2018-12-26 DIAGNOSIS — R079 Chest pain, unspecified: Secondary | ICD-10-CM | POA: Diagnosis not present

## 2018-12-26 DIAGNOSIS — R112 Nausea with vomiting, unspecified: Secondary | ICD-10-CM | POA: Insufficient documentation

## 2018-12-26 LAB — CBC
HCT: 38.1 % (ref 36.0–46.0)
Hemoglobin: 12.6 g/dL (ref 12.0–15.0)
MCH: 27.2 pg (ref 26.0–34.0)
MCHC: 33.1 g/dL (ref 30.0–36.0)
MCV: 82.3 fL (ref 80.0–100.0)
Platelets: 310 10*3/uL (ref 150–400)
RBC: 4.63 MIL/uL (ref 3.87–5.11)
RDW: 13.2 % (ref 11.5–15.5)
WBC: 12.4 10*3/uL — ABNORMAL HIGH (ref 4.0–10.5)
nRBC: 0 % (ref 0.0–0.2)

## 2018-12-26 LAB — BASIC METABOLIC PANEL
Anion gap: 13 (ref 5–15)
BUN: 11 mg/dL (ref 6–20)
CO2: 20 mmol/L — ABNORMAL LOW (ref 22–32)
Calcium: 8.6 mg/dL — ABNORMAL LOW (ref 8.9–10.3)
Chloride: 106 mmol/L (ref 98–111)
Creatinine, Ser: 0.71 mg/dL (ref 0.44–1.00)
GFR calc Af Amer: >60 mL/min (ref >60)
GFR calc non Af Amer: >60 mL/min (ref >60)
GLUCOSE: 141 mg/dL — AB (ref 70–99)
Potassium: 3.9 mmol/L (ref 3.5–5.1)
Sodium: 139 mmol/L (ref 135–145)

## 2018-12-26 LAB — URINALYSIS, COMPLETE (UACMP) WITH MICROSCOPIC
Bacteria, UA: NONE SEEN
Bilirubin Urine: NEGATIVE
Glucose, UA: NEGATIVE mg/dL
Hgb urine dipstick: NEGATIVE
Ketones, ur: NEGATIVE mg/dL
Leukocytes,Ua: NEGATIVE
Nitrite: NEGATIVE
Protein, ur: NEGATIVE mg/dL
Specific Gravity, Urine: 1.004 — ABNORMAL LOW (ref 1.005–1.030)
WBC, UA: NONE SEEN WBC/hpf (ref 0–5)
pH: 5 (ref 5.0–8.0)

## 2018-12-26 LAB — HEPATIC FUNCTION PANEL
ALK PHOS: 65 U/L (ref 38–126)
ALT: 20 U/L (ref 0–44)
AST: 19 U/L (ref 15–41)
Albumin: 3.6 g/dL (ref 3.5–5.0)
BILIRUBIN DIRECT: 0.1 mg/dL (ref 0.0–0.2)
Indirect Bilirubin: 0.2 mg/dL — ABNORMAL LOW (ref 0.3–0.9)
Total Bilirubin: 0.3 mg/dL (ref 0.3–1.2)
Total Protein: 7.1 g/dL (ref 6.5–8.1)

## 2018-12-26 LAB — POCT PREGNANCY, URINE: Preg Test, Ur: NEGATIVE

## 2018-12-26 LAB — LIPASE, BLOOD: Lipase: 32 U/L (ref 11–51)

## 2018-12-26 LAB — TROPONIN I

## 2018-12-26 MED ORDER — ACETAMINOPHEN 500 MG PO TABS
1000.0000 mg | ORAL_TABLET | Freq: Once | ORAL | Status: AC
  Administered 2018-12-26: 1000 mg via ORAL
  Filled 2018-12-26: qty 2

## 2018-12-26 MED ORDER — ONDANSETRON 4 MG PO TBDP: 4.0000 mg | ORAL_TABLET | Freq: Three times a day (TID) | ORAL | 0 refills | Status: DC | PRN

## 2018-12-26 MED ORDER — ONDANSETRON 4 MG PO TBDP
4.0000 mg | ORAL_TABLET | Freq: Once | ORAL | Status: AC
  Administered 2018-12-26: 4 mg via ORAL
  Filled 2018-12-26: qty 1

## 2018-12-26 MED ORDER — LOPERAMIDE HCL 2 MG PO TABS: 2.0000 mg | ORAL_TABLET | Freq: Four times a day (QID) | ORAL | 0 refills | Status: DC | PRN

## 2018-12-26 NOTE — ED Notes (Signed)
Pt able to swallow pills and water with ease. Pt states no issues keeping water down at this time.

## 2018-12-26 NOTE — Discharge Instructions (Signed)
Please take a clear liquid diet for the next 24 hours, then advance her diet to a bland diet as tolerated.  You may take Zofran as needed for nausea and vomiting.  If you continue to have diarrhea, you may also take Imodium.   Make an appointment with your primary care physician and the cardiologist for follow-up as needed.  Return to the emergency department if you develop severe pain, lightheadedness or fainting, inability to keep down fluids, abdominal pain, fever, or any other symptoms concerning to you.

## 2018-12-26 NOTE — ED Triage Notes (Signed)
Pt c/o left sided chest pain with intermittent left arm numbness x2 days. Pt was seen 3/30 in ED for same with clear workup. Pt back this AM due to added symptom of nausea/vomiting.

## 2018-12-26 NOTE — ED Provider Notes (Addendum)
[facility] Emergency Department Provider Note  ____________________________________________  Time seen: Approximately 1:59 AM  I have reviewed the triage vital signs and the nursing notes.   HISTORY  Chief Complaint Chest Pain    HPI Lisa Schroeder is a 43 y.o. female with a history of bipolar disorder seen here less than 12 hours ago presenting with chest pain, left arm pain, now with nausea vomiting and diarrhea.  The patient reports a pressure sensation in the left chest which radiates the left arm without any diaphoresis, palpitations, lightheadedness or syncope.  She had a full evaluation here including troponin and EKG, both of which were reassuring.  She reports that she went home and her chest pain was worsening, and then she developed 2 episodes of a loose non-watery nonbloody stool associated with nausea and vomiting.  She has been able to keep down liquids since vomiting without any difficulty.  She does not have any associated abdominal pain.  She has not had any dysuria.  She has had no known sick contacts.  Past Medical History:  Diagnosis Date  . Abnormal Pap smear of cervix   . Depression   . HPV in female     Patient Active Problem List   Diagnosis Date Noted  . ADD (attention deficit disorder) 09/17/2018  . Bipolar II disorder (HCC) 09/17/2018    Past Surgical History:  Procedure Laterality Date  . COLPOSCOPY     for many yrs per pt    Current Outpatient Rx  . Order #: [id number] Class: Normal  . Order #: [id number] Class: Normal  . Order #: [id number] Class: Normal  . Order #: [id number] Class: Normal    Allergies Patient has no known allergies.  Family History  Problem Relation Age of Onset  . Thyroid disease Mother   . Diabetes Father   . Heart disease Father   . Diabetes Paternal Grandfather   . Heart disease Paternal Grandfather     Social History Social History   Tobacco Use  . Smoking status: Never Smoker  .  Smokeless tobacco: Never Used  Substance Use Topics  . Alcohol use: Yes    Comment: rare  . Drug use: No    Review of Systems Constitutional: No fever/chills.  Lightheadedness or syncope. Eyes: No visual changes. ENT: No sore throat. No congestion or rhinorrhea. Cardiovascular: Positive chest pain. Denies palpitations. Respiratory: Denies shortness of breath.  No cough. Gastrointestinal: No abdominal pain.  Positive nausea, positive vomiting.  Positive diarrhea.  No constipation. Genitourinary: Negative for dysuria. Musculoskeletal: Negative for back pain. Skin: Negative for rash. Neurological: Negative for headaches. No focal numbness, tingling or weakness.     ____________________________________________   PHYSICAL EXAM:  VITAL SIGNS: ED Triage Vitals [12/26/18 0144]  Enc Vitals Group     BP (!) 141/80     Pulse Rate 81     Resp 18     Temp 98.3 F (36.8 C)     Temp Source Oral     SpO2 98 %     Weight      Height      Head Circumference      Peak Flow      Pain Score      Pain Loc      Pain Edu?      Excl. in GC?     Constitutional: Alert and oriented. Answers questions appropriately.  Patient is comfortable appearing and is able to stand up and move around the room without any difficulty.  Eyes: Conjunctivae are normal.  EOMI. No scleral icterus. Head: Atraumatic. Nose: No congestion/rhinnorhea. Mouth/Throat: Mucous membranes are moist.  Neck: No stridor.  Supple.  No JVD.  No meningismus. Cardiovascular: Normal rate, regular rhythm. No murmurs, rubs or gallops.  Respiratory: Normal respiratory effort.  No accessory muscle use or retractions. Lungs CTAB.  No wheezes, rales or ronchi. Gastrointestinal: Overweight.  Soft, nontender and nondistended.  No guarding or rebound.  No peritoneal signs. Musculoskeletal: No LE edema. No ttp in the calves or palpable cords.  Negative Homan's sign. Neurologic:  A&Ox3.  Speech is clear.  Face and smile are symmetric.   EOMI.  Moves all extremities well. Skin:  Skin is warm, dry and intact. No rash noted. Psychiatric: Mood and affect are normal. Speech and behavior are normal.  Normal judgement.  ____________________________________________   LABS (all labs ordered are listed, but only abnormal results are displayed)  Labs Reviewed  BASIC METABOLIC PANEL - Abnormal; Notable for the following components:      Result Value   CO2 20 (*)    Glucose, Bld 141 (*)    Calcium 8.6 (*)    All other components within normal limits  CBC - Abnormal; Notable for the following components:   WBC 12.4 (*)    All other components within normal limits  HEPATIC FUNCTION PANEL - Abnormal; Notable for the following components:   Indirect Bilirubin 0.2 (*)    All other components within normal limits  URINALYSIS, COMPLETE (UACMP) WITH MICROSCOPIC - Abnormal; Notable for the following components:   Color, Urine STRAW (*)    APPearance CLEAR (*)    Specific Gravity, Urine 1.004 (*)    All other components within normal limits  TROPONIN I  LIPASE, BLOOD  POC URINE PREG, ED  POCT PREGNANCY, URINE   ____________________________________________  EKG  ED ECG REPORT I, Anne-Caroline Sharma CovertNorman, the attending physician, personally viewed and interpreted this ECG.   Date: 12/26/2018  EKG Time: 143  Rate: 81  Rhythm: normal sinus rhythm  Axis: normal  Intervals:none  ST&T Change: No STEMI  This EKG is compared to the one yesterday and is grossly unchanged in morphology.  ____________________________________________  RADIOLOGY  Dg Chest 2 View  Result Date: 12/25/2018 CLINICAL DATA:  Left-sided chest pain radiating to left arm with tingling since this morning. Shortness of breath this morning. Quit beeping 2 days ago. EXAM: CHEST - 2 VIEW COMPARISON:  None. FINDINGS: Lungs are adequately inflated without consolidation or effusion. Cardiomediastinal silhouette and remainder of the exam is normal. IMPRESSION: No  active cardiopulmonary disease. Electronically Signed   By: Elberta Fortisaniel  Boyle M.D.   On: 12/25/2018 17:42    ____________________________________________   PROCEDURES  Procedure(s) performed: None  Procedures  Critical Care performed: No ____________________________________________   INITIAL IMPRESSION / ASSESSMENT AND PLAN / ED COURSE  Pertinent labs & imaging results that were available during my care of the patient were reviewed by me and considered in my medical decision making (see chart for details).  43 y.o. female presenting with ongoing chest pain that was evaluated earlier today, now with nausea vomiting and diarrhea but able to tolerate liquids without difficulty.  Overall, the patient is hemodynamically stable and afebrile.  We will get a repeat troponin and EKG for her cardiac evaluation.  PE and aortic pathology are considered but fairly unlikely. We will also get basic electrolytes and a urinalysis.  I do not know the cause of her nausea vomiting and diarrhea but her abdominal exam  is reassuring and I do not suspect an acute intra-abdominal pathology or severe infection.  Given the pandemic, coronavirus is considered although this would be an atypical presentation.  Certainly, a viral or foodborne GI illness is possible.  Plan symptomatic treatment and reevaluation for final disposition.  ----------------------------------------- 3:37 AM on 12/26/2018 ----------------------------------------- Patient's work-up in the emergency department is reassuring.  She has a minimal elevation of her white blood cell count at 12.4 but her UA is negative and her abdomen continues to be soft and nontender.  At this time, the patient is safe for discharge home.  I will discharge her with Zofran and instructions to follow-up both with cardiology as well as her PMD as needed.   Rhilynn Mattia was evaluated in Emergency Department on 12/26/2018 for the symptoms described in the history of present  illness. She was evaluated in the context of the global COVID-19 pandemic, which necessitated consideration that the patient might be at risk for infection with the SARS-CoV-2 virus that causes COVID-19. Institutional protocols and algorithms that pertain to the evaluation of patients at risk for COVID-19 are in a state of rapid change based on information released by regulatory bodies including the CDC and federal and state organizations. These policies and algorithms were followed during the patient's care in the ED.   ____________________________________________  FINAL CLINICAL IMPRESSION(S) / ED DIAGNOSES  Final diagnoses:  Chest pain, unspecified type  Left arm pain  Nausea vomiting and diarrhea         NEW MEDICATIONS STARTED DURING THIS VISIT:  New Prescriptions   No medications on file      Rockne Menghini, MD 12/26/18 0207    Rockne Menghini, MD 12/26/18 [phone]

## 2018-12-27 ENCOUNTER — Telehealth: Payer: Self-pay

## 2018-12-27 ENCOUNTER — Encounter: Payer: BLUE CROSS/BLUE SHIELD | Admitting: Certified Nurse Midwife

## 2018-12-27 NOTE — Telephone Encounter (Signed)
Contacted pt- explained that chest pain is out of our scope of practice therefore appointment that was made with AT was cancelled.

## 2018-12-28 ENCOUNTER — Other Ambulatory Visit (HOSPITAL_COMMUNITY): Payer: Self-pay | Admitting: Pediatrics

## 2018-12-28 ENCOUNTER — Other Ambulatory Visit: Payer: Self-pay | Admitting: Pediatrics

## 2018-12-28 ENCOUNTER — Other Ambulatory Visit: Payer: Self-pay | Admitting: Internal Medicine

## 2018-12-28 DIAGNOSIS — R0789 Other chest pain: Secondary | ICD-10-CM

## 2018-12-28 DIAGNOSIS — Z7689 Persons encountering health services in other specified circumstances: Secondary | ICD-10-CM

## 2019-01-26 ENCOUNTER — Ambulatory Visit
Admission: RE | Admit: 2019-01-26 | Discharge: 2019-01-26 | Disposition: A | Discharge status: Home / Self Care | Payer: BLUE CROSS/BLUE SHIELD | Source: Ambulatory Visit | Attending: Internal Medicine | Admitting: Internal Medicine

## 2019-01-26 ENCOUNTER — Other Ambulatory Visit: Payer: Self-pay

## 2019-01-26 DIAGNOSIS — R0789 Other chest pain: Secondary | ICD-10-CM | POA: Insufficient documentation

## 2019-01-26 DIAGNOSIS — Z7689 Persons encountering health services in other specified circumstances: Secondary | ICD-10-CM | POA: Insufficient documentation

## 2019-03-02 ENCOUNTER — Encounter: Payer: Self-pay | Admitting: Psychiatry

## 2019-03-02 ENCOUNTER — Ambulatory Visit: Payer: BC Managed Care – PPO | Admitting: Psychiatry

## 2019-03-02 ENCOUNTER — Other Ambulatory Visit: Payer: Self-pay

## 2019-03-02 DIAGNOSIS — F319 Bipolar disorder, unspecified: Secondary | ICD-10-CM

## 2019-03-02 DIAGNOSIS — F902 Attention-deficit hyperactivity disorder, combined type: Secondary | ICD-10-CM | POA: Diagnosis not present

## 2019-03-02 MED ORDER — METHYLPHENIDATE HCL ER (OSM) 36 MG PO TBCR: 36.0000 mg | EXTENDED_RELEASE_TABLET | Freq: Every day | ORAL | 0 refills | Status: DC

## 2019-03-02 NOTE — Progress Notes (Signed)
Lisa Schroeder [id number] 01-05-1976 43 y.o.   Virtual Visit via Telephone Note  I connected with pt by telephone and verified that I am speaking with the correct person using two identifiers.   I discussed the limitations, risks, security and privacy concerns of performing an evaluation and management service by telephone and the availability of in person appointments. I also discussed with the patient that there may be a patient responsible charge related to this service. The patient expressed understanding and agreed to proceed.  I discussed the assessment and treatment plan with the patient. The patient was provided an opportunity to ask questions and all were answered. The patient agreed with the plan and demonstrated an understanding of the instructions.   The patient was advised to call back or seek an in-person evaluation if the symptoms worsen or if the condition fails to improve as anticipated.  I provided 15 minutes of non-face-to-face time during this encounter. The call started at 1025 and ended at 1040. The patient was located at home and the provider was located office.   Subjective:   Patient ID:  Lisa Schroeder is a 43 y.o. (DOB 07/11/1976) female.  Chief Complaint:  Chief Complaint  Patient presents with  . Follow-up    Medication Management  . ADD    Medication Management    HPI Lisa Schroeder presents to the office today for follow-up of ADD and mood.  Last talked in January.  No med changes.  Doing well without major issues.    Last visit changed to Metadate CD and that worked well but over $100.  Better energy and focus and more productive.  Tolerated well.  Ran out.  Dose seems good.  Does ok without stimulant but better focus and energy and happier on it.  Be nice if insurance would cover it.  Ran out of Abilify for a couple of weeks and had episode chest pain and wonders about it as a connected.  Otherwise fine.  A lot of stress but handling it.  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  No Ativan in a couple of months.  Patient reports stable mood and denies depressed or irritable moods.  Patient denies any recent difficulty with anxiety.  Patient denies difficulty with sleep initiation or maintenance. Denies appetite disturbance.  Patient reports that energy and motivation have been good.  Patient denies any difficulty with concentration.  Patient denies any suicidal ideation.  Past Psychiatric Medication Trials: Citalopram, venlafaxine, Wellbutrin, lamotrigine, Latuda cost, Vraylar side effects, Abilify 5, lamotrigine 200, Ritalin, Rexulti  Review of Systems:  Review of Systems  Neurological: Negative for tremors and weakness.  Psychiatric/Behavioral: Negative for agitation, behavioral problems, confusion, decreased concentration, dysphoric mood, hallucinations, self-injury, sleep disturbance and suicidal ideas. The patient is not nervous/anxious and is not hyperactive.     Medications: I have reviewed the patient's current medications.  Current Outpatient Medications  Medication Sig Dispense Refill  . albuterol (VENTOLIN HFA) 108 (90 Base) MCG/ACT inhaler Inhale into the lungs.    . ARIPiprazole (ABILIFY) 5 MG tablet TAKE 1 TABLET BY MOUTH EVERY DAY 90 tablet 1  . lamoTRIgine (LAMICTAL) 100 MG tablet Take 2 tablets (200 mg total) by mouth daily. 180 tablet 1  . levothyroxine (SYNTHROID) 25 MCG tablet TK 1 TAB BY MOUTH EVERY MORNING AT LEAST 30 60 MINS BEFORE BREAKFAST W/ A GLASS OF WATER    . Norgestimate-Ethinyl Estradiol Triphasic 0.18/0.215/0.25 MG-25 MCG tab Take 1 tablet by mouth daily. 1  Package 11  . methylphenidate (CONCERTA) 36 MG PO CR tablet Take 1 tablet (36 mg total) by mouth daily. 30 tablet 0   No current facility-administered medications for this visit.     Medication Side Effects: None  Allergies: No Known Allergies  Past Medical History:  Diagnosis Date  . Abnormal Pap smear of cervix   . Depression   . HPV  in female     Family History  Problem Relation Age of Onset  . Thyroid disease Mother   . Diabetes Father   . Heart disease Father   . Diabetes Paternal Grandfather   . Heart disease Paternal Grandfather     Social History   Socioeconomic History  . Marital status: Divorced    Spouse name: Not on file  . Number of children: Not on file  . Years of education: Not on file  . Highest education level: Not on file  Occupational History  . Not on file  Social Needs  . Financial resource strain: Not on file  . Food insecurity:    Worry: Not on file    Inability: Not on file  . Transportation needs:    Medical: Not on file    Non-medical: Not on file  Tobacco Use  . Smoking status: Never Smoker  . Smokeless tobacco: Never Used  Substance and Sexual Activity  . Alcohol use: Yes    Comment: rare  . Drug use: No  . Sexual activity: Yes    Birth control/protection: None    Comment: vasectomy  Lifestyle  . Physical activity:    Days per week: Not on file    Minutes per session: Not on file  . Stress: Not on file  Relationships  . Social connections:    Talks on phone: Not on file    Gets together: Not on file    Attends religious service: Not on file    Active member of club or organization: Not on file    Attends meetings of clubs or organizations: Not on file    Relationship status: Not on file  . Intimate partner violence:    Fear of current or ex partner: Not on file    Emotionally abused: Not on file    Physically abused: Not on file    Forced sexual activity: Not on file  Other Topics Concern  . Not on file  Social History Narrative  . Not on file    Past Medical History, Surgical history, Social history, and Family history were reviewed and updated as appropriate.   Please see review of systems for further details on the patient's review from today.   Objective:   Physical Exam:  There were no vitals taken for this visit.  Physical  Exam Constitutional:      General: She is not in acute distress.    Appearance: She is well-developed.  Musculoskeletal:        General: No deformity.  Neurological:     Mental Status: She is alert and oriented to person, place, and time.     Motor: No tremor.     Coordination: Coordination normal.     Gait: Gait normal.  Psychiatric:        Attention and Perception: Attention and perception normal.        Mood and Affect: Mood is not anxious or depressed. Affect is not labile, blunt, angry or inappropriate.        Speech: Speech normal.  Behavior: Behavior normal.        Thought Content: Thought content normal. Thought content does not include homicidal or suicidal ideation. Thought content does not include homicidal or suicidal plan.        Cognition and Memory: Cognition normal.        Judgment: Judgment normal.     Comments: Insight intact. No auditory or visual hallucinations. No delusions.      Lab Review:     Component Value Date/Time   NA 139 12/26/2018 0145   NA 141 01/17/2017 1714   K 3.9 12/26/2018 0145   CL 106 12/26/2018 0145   CO2 20 (L) 12/26/2018 0145   GLUCOSE 141 (H) 12/26/2018 0145   BUN 11 12/26/2018 0145   BUN 10 01/17/2017 1714   CREATININE 0.71 12/26/2018 0145   CALCIUM 8.6 (L) 12/26/2018 0145   PROT 7.1 12/26/2018 0145   PROT 7.7 01/17/2017 1714   ALBUMIN 3.6 12/26/2018 0145   ALBUMIN 4.7 01/17/2017 1714   AST 19 12/26/2018 0145   ALT 20 12/26/2018 0145   ALKPHOS 65 12/26/2018 0145   BILITOT 0.3 12/26/2018 0145   BILITOT 0.3 01/17/2017 1714   GFRNONAA >60 12/26/2018 0145   GFRAA >60 12/26/2018 0145       Component Value Date/Time   WBC 12.4 (H) 12/26/2018 0145   RBC 4.63 12/26/2018 0145   HGB 12.6 12/26/2018 0145   HCT 38.1 12/26/2018 0145   PLT 310 12/26/2018 0145   MCV 82.3 12/26/2018 0145   MCH 27.2 12/26/2018 0145   MCHC 33.1 12/26/2018 0145   RDW 13.2 12/26/2018 0145    No results found for: POCLITH, LITHIUM   No  results found for: PHENYTOIN, PHENOBARB, VALPROATE, CBMZ   .res Assessment: Plan:    Attention deficit hyperactivity disorder (ADHD), combined type  Bipolar I disorder (HCC)   Good response to meds.  Couldn't afford Metadate.  Try Concerta 36 mg daily.    Disc fear flying in July coming up.  Asks for bz.  OK lorazepam.  We discussed the short-term risks associated with benzodiazepines including sedation and increased fall risk among others.  Discussed long-term side effect risk including dependence, potential withdrawal symptoms, and the potential eventual dose-related risk of dementia.  Will not drive at destination.  Ativan 0.5 mg 1-2 before trip if needed.  FU 6 mos  Meredith Staggersarey Cottle, MD, DFAPA  Please see After Visit Summary for patient specific instructions.  Future Appointments  Date Time Provider Department Center  08/17/2019  8:00 AM Doreene Burkehompson, Annie, CNM [facility] None    No orders of the defined types were placed in this encounter.     -------------------------------

## 2019-03-22 ENCOUNTER — Other Ambulatory Visit: Payer: Self-pay | Admitting: Psychiatry

## 2019-03-22 DIAGNOSIS — F319 Bipolar disorder, unspecified: Secondary | ICD-10-CM

## 2019-03-23 ENCOUNTER — Ambulatory Visit: Payer: BLUE CROSS/BLUE SHIELD | Admitting: Psychiatry

## 2019-04-02 ENCOUNTER — Other Ambulatory Visit: Payer: Self-pay

## 2019-04-02 ENCOUNTER — Telehealth: Payer: Self-pay | Admitting: Psychiatry

## 2019-04-02 MED ORDER — METHYLPHENIDATE HCL ER (OSM) 36 MG PO TBCR: 36.0000 mg | EXTENDED_RELEASE_TABLET | Freq: Every day | ORAL | 0 refills | Status: DC

## 2019-04-02 NOTE — Telephone Encounter (Signed)
Pended for approval.

## 2019-04-02 NOTE — Telephone Encounter (Signed)
Pt needs refill on concerta sent to [facility] on [address] in [place].

## 2019-04-20 ENCOUNTER — Other Ambulatory Visit: Payer: Self-pay | Admitting: Psychiatry

## 2019-04-20 DIAGNOSIS — F319 Bipolar disorder, unspecified: Secondary | ICD-10-CM

## 2019-05-04 ENCOUNTER — Other Ambulatory Visit: Payer: Self-pay

## 2019-05-04 ENCOUNTER — Telehealth: Payer: Self-pay | Admitting: Psychiatry

## 2019-05-04 MED ORDER — METHYLPHENIDATE HCL ER (OSM) 36 MG PO TBCR: 36.0000 mg | EXTENDED_RELEASE_TABLET | Freq: Every day | ORAL | 0 refills | Status: DC

## 2019-05-04 NOTE — Telephone Encounter (Signed)
Pended for approval.

## 2019-05-04 NOTE — Telephone Encounter (Signed)
Patient called and said that she needs a refill on her concerta 36 mg to [facility] [address] in [place]. Next appt 12/4

## 2019-05-22 ENCOUNTER — Telehealth: Payer: Self-pay | Admitting: Psychiatry

## 2019-05-22 ENCOUNTER — Other Ambulatory Visit: Payer: Self-pay | Admitting: Psychiatry

## 2019-05-22 MED ORDER — LORAZEPAM 0.5 MG PO TABS: 0.5000 mg | ORAL_TABLET | Freq: Three times a day (TID) | ORAL | 0 refills | Status: DC | PRN

## 2019-05-22 NOTE — Telephone Encounter (Signed)
Lisa Schroeder called to request a new prescription for Ativan.  You prescribed it in Jan/2019.  She is having some panic attacks and is thinking this med will help. Next appt is 08/31/19.  [facility] [address] in [place]

## 2019-05-22 NOTE — Telephone Encounter (Signed)
Done

## 2019-06-12 ENCOUNTER — Telehealth: Payer: Self-pay | Admitting: Psychiatry

## 2019-06-12 NOTE — Telephone Encounter (Signed)
Patient already has Rx's on file for Concerta for Sept and Oct

## 2019-06-12 NOTE — Telephone Encounter (Signed)
Pt called to request refill on Concerta @ [facility]

## 2019-06-28 ENCOUNTER — Telehealth: Payer: Self-pay | Admitting: Certified Nurse Midwife

## 2019-06-28 NOTE — Telephone Encounter (Signed)
Patient called requesting to come in for labs just for Hep C and HIV. Please advise. Thanks

## 2019-06-29 ENCOUNTER — Telehealth: Payer: Self-pay

## 2019-06-29 DIAGNOSIS — Z202 Contact with and (suspected) exposure to infections with a predominantly sexual mode of transmission: Secondary | ICD-10-CM

## 2019-06-29 NOTE — Telephone Encounter (Signed)
Sure , That is fine with me. She is not due for annual until Nov. I am fine with getting labs for her as long as she has annual scheduled for Nov or Dec.   [place]

## 2019-06-29 NOTE — Telephone Encounter (Signed)
mychart message sent and lab orders in.

## 2019-07-06 ENCOUNTER — Other Ambulatory Visit: Payer: Self-pay

## 2019-07-06 ENCOUNTER — Other Ambulatory Visit: Payer: BLUE CROSS/BLUE SHIELD

## 2019-07-06 DIAGNOSIS — Z202 Contact with and (suspected) exposure to infections with a predominantly sexual mode of transmission: Secondary | ICD-10-CM

## 2019-07-07 LAB — HIV ANTIBODY (ROUTINE TESTING W REFLEX): HIV Screen 4th Generation wRfx: NONREACTIVE

## 2019-07-07 LAB — HEPATITIS C ANTIBODY: Hep C Virus Ab: <0.1 s/co ratio (ref 0.0–0.9)

## 2019-07-08 ENCOUNTER — Other Ambulatory Visit: Payer: Self-pay | Admitting: Certified Nurse Midwife

## 2019-08-01 ENCOUNTER — Other Ambulatory Visit: Payer: Self-pay | Admitting: Certified Nurse Midwife

## 2019-08-17 ENCOUNTER — Encounter: Payer: Self-pay | Admitting: Certified Nurse Midwife

## 2019-08-17 ENCOUNTER — Encounter: Payer: BLUE CROSS/BLUE SHIELD | Admitting: Certified Nurse Midwife

## 2019-08-25 ENCOUNTER — Other Ambulatory Visit: Payer: Self-pay | Admitting: Certified Nurse Midwife

## 2019-08-29 ENCOUNTER — Other Ambulatory Visit: Payer: Self-pay

## 2019-08-29 ENCOUNTER — Telehealth: Payer: Self-pay | Admitting: Psychiatry

## 2019-08-29 MED ORDER — METHYLPHENIDATE HCL ER (OSM) 36 MG PO TBCR: 36.0000 mg | EXTENDED_RELEASE_TABLET | Freq: Every day | ORAL | 0 refills | Status: DC

## 2019-08-29 NOTE — Telephone Encounter (Signed)
Last refill 07/20/2019 Pended for approval

## 2019-08-29 NOTE — Telephone Encounter (Signed)
Pt has to RS Dec 4th appt - RS to Jan 8- Please send in her [facility] [address], [place].

## 2019-08-31 ENCOUNTER — Ambulatory Visit: Payer: BC Managed Care – PPO | Admitting: Psychiatry

## 2019-08-31 ENCOUNTER — Encounter: Payer: Self-pay | Admitting: Certified Nurse Midwife

## 2019-09-04 ENCOUNTER — Telehealth: Payer: Self-pay

## 2019-09-05 NOTE — Telephone Encounter (Signed)
error 

## 2019-09-10 ENCOUNTER — Other Ambulatory Visit: Payer: Self-pay | Admitting: Certified Nurse Midwife

## 2019-09-10 DIAGNOSIS — Z1231 Encounter for screening mammogram for malignant neoplasm of breast: Secondary | ICD-10-CM

## 2019-09-14 ENCOUNTER — Encounter: Payer: Self-pay | Admitting: Certified Nurse Midwife

## 2019-09-16 ENCOUNTER — Other Ambulatory Visit: Payer: Self-pay | Admitting: Certified Nurse Midwife

## 2019-10-05 ENCOUNTER — Encounter: Payer: Self-pay | Admitting: Psychiatry

## 2019-10-05 ENCOUNTER — Ambulatory Visit (INDEPENDENT_AMBULATORY_CARE_PROVIDER_SITE_OTHER): Payer: BC Managed Care – PPO | Admitting: Psychiatry

## 2019-10-05 DIAGNOSIS — F3112 Bipolar disorder, current episode manic without psychotic features, moderate: Secondary | ICD-10-CM | POA: Diagnosis not present

## 2019-10-05 DIAGNOSIS — F4001 Agoraphobia with panic disorder: Secondary | ICD-10-CM | POA: Diagnosis not present

## 2019-10-05 DIAGNOSIS — F319 Bipolar disorder, unspecified: Secondary | ICD-10-CM | POA: Diagnosis not present

## 2019-10-05 DIAGNOSIS — F902 Attention-deficit hyperactivity disorder, combined type: Secondary | ICD-10-CM

## 2019-10-05 MED ORDER — DIVALPROEX SODIUM ER 500 MG PO TB24: ORAL_TABLET | ORAL | 1 refills | Status: DC

## 2019-10-05 MED ORDER — METHYLPHENIDATE HCL ER (OSM) 36 MG PO TBCR: 36.0000 mg | EXTENDED_RELEASE_TABLET | Freq: Every day | ORAL | 0 refills | Status: DC

## 2019-10-05 NOTE — Progress Notes (Signed)
Lisa Schroeder [id number] Aug 15, 1976 44 y.o.   Virtual Visit via Telephone Note  I connected with pt by telephone and verified that I am speaking with the correct person using two identifiers.   I discussed the limitations, risks, security and privacy concerns of performing an evaluation and management service by telephone and the availability of in person appointments. I also discussed with the patient that there may be a patient responsible charge related to this service. The patient expressed understanding and agreed to proceed.  I discussed the assessment and treatment plan with the patient. The patient was provided an opportunity to ask questions and all were answered. The patient agreed with the plan and demonstrated an understanding of the instructions.   The patient was advised to call back or seek an in-person evaluation if the symptoms worsen or if the condition fails to improve as anticipated.  I provided 15 minutes of non-face-to-face time during this encounter. The call started at 1025 and ended at 1040. The patient was located at home and the provider was located office.   Subjective:   Patient ID:  Lisa Schroeder is a 44 y.o. (DOB 1976/02/05) female.  Chief Complaint:  Chief Complaint  Patient presents with  . Follow-up    Medication Management  . ADHD    Medication Management  . Other    Bipolar 2  . Anxiety    HPI [name] presents to the office today for follow-up of ADD and mood.  Last visit June 2020.  She could not afford Metadate so she was given a prescription for Concerta 36 mg daily.  No other med changes.  She remained on Abilify 5 mg daily, lamotrigine 200 mg daily.  She called May 22, 2019 stating that she was having panic attacks and asked for prescription for Ativan.  It was okayed for her to use sparingly.  Concerta affordable.  Much better with anxiety than in August.  Anxiety manageable.  Looked up meds and therapist concern she might  be having manic sx bc promiscuous over the summer waxing and waning.  That triggered some anxiety.  Admits hyperactive and racing thoughts at the time also.  Sx stopped for awhile and now yearning for those behaviors again and racing thoughts also.  Feeling somewhat euphoric and irritable too.    Doing well without major issues.    Last visit changed to Metadate CD and that worked well but over $100.  Better energy and focus and more productive.  Tolerated well.  Ran out.  Dose seems good.  Does ok without stimulant but better focus and energy and happier on it.  Be nice if insurance would cover it.  Ran out of Abilify for a couple of weeks and had episode chest pain and wonders about it as a connected.  Otherwise fine.  A lot of stress but handling it.  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  No Ativan in a couple of months.  Patient reports stable mood and denies depressed or irritable moods.  Patient denies any recent difficulty with anxiety.  Patient denies difficulty with sleep initiation or maintenance. Denies appetite disturbance.  Patient reports that energy and motivation have been good.  Patient denies any difficulty with concentration.  Patient denies any suicidal ideation.  Past Psychiatric Medication Trials: Citalopram, venlafaxine, Wellbutrin, lamotrigine, Latuda cost, Vraylar side effects, Abilify 5, lamotrigine 200, Ritalin, Rexulti  Review of Systems:  Review of Systems  Neurological: Negative for dizziness, tremors, weakness and  headaches.  Psychiatric/Behavioral: Negative for agitation, behavioral problems, confusion, decreased concentration, dysphoric mood, hallucinations, self-injury, sleep disturbance and suicidal ideas. The patient is not nervous/anxious and is not hyperactive.     Medications: I have reviewed the patient's current medications.  Current Outpatient Medications  Medication Sig Dispense Refill  . ARIPiprazole (ABILIFY) 5 MG tablet TAKE 1 TABLET BY  MOUTH EVERY DAY 90 tablet 2  . lamoTRIgine (LAMICTAL) 100 MG tablet TAKE 2 TABLETS BY MOUTH EVERY DAY 180 tablet 1  . levothyroxine (SYNTHROID) 25 MCG tablet TK 1 TAB BY MOUTH EVERY MORNING AT LEAST 30 60 MINS BEFORE BREAKFAST W/ A GLASS OF WATER    . LORazepam (ATIVAN) 0.5 MG tablet Take 1 tablet (0.5 mg total) by mouth every 8 (eight) hours as needed for anxiety. 30 tablet 0  . methylphenidate (CONCERTA) 36 MG PO CR tablet Take 1 tablet (36 mg total) by mouth daily. 30 tablet 0  . [START ON 11/02/2019] methylphenidate (CONCERTA) 36 MG PO CR tablet Take 1 tablet (36 mg total) by mouth daily. 30 tablet 0  . [START ON 11/30/2019] methylphenidate (CONCERTA) 36 MG PO CR tablet Take 1 tablet (36 mg total) by mouth daily. 30 tablet 0  . TRI-LO-MARZIA 0.18/0.215/0.25 MG-25 MCG tab TAKE 1 TABLET BY MOUTH EVERY DAY 28 tablet 1  . divalproex (DEPAKOTE ER) 500 MG 24 hr tablet 1 for 5 nights then 2 nightly 60 tablet 1   No current facility-administered medications for this visit.    Medication Side Effects: None  Allergies: No Known Allergies  Past Medical History:  Diagnosis Date  . Abnormal Pap smear of cervix   . Depression   . HPV in female     Family History  Problem Relation Age of Onset  . Thyroid disease Mother   . Diabetes Father   . Heart disease Father   . Diabetes Paternal Grandfather   . Heart disease Paternal Grandfather     Social History   Socioeconomic History  . Marital status: Divorced    Spouse name: Not on file  . Number of children: Not on file  . Years of education: Not on file  . Highest education level: Not on file  Occupational History  . Not on file  Tobacco Use  . Smoking status: Never Smoker  . Smokeless tobacco: Never Used  Substance and Sexual Activity  . Alcohol use: Yes    Comment: rare  . Drug use: No  . Sexual activity: Yes    Birth control/protection: None    Comment: vasectomy  Other Topics Concern  . Not on file  Social History Narrative   . Not on file   Social Determinants of Health   Financial Resource Strain:   . Difficulty of Paying Living Expenses: Not on file  Food Insecurity:   . Worried About Programme researcher, broadcasting/film/video in the Last Year: Not on file  . Ran Out of Food in the Last Year: Not on file  Transportation Needs:   . Lack of Transportation (Medical): Not on file  . Lack of Transportation (Non-Medical): Not on file  Physical Activity:   . Days of Exercise per Week: Not on file  . Minutes of Exercise per Session: Not on file  Stress:   . Feeling of Stress : Not on file  Social Connections:   . Frequency of Communication with Friends and Family: Not on file  . Frequency of Social Gatherings with Friends and Family: Not on file  . Attends  Religious Services: Not on file  . Active Member of Clubs or Organizations: Not on file  . Attends Banker Meetings: Not on file  . Marital Status: Not on file  Intimate Partner Violence:   . Fear of Current or Ex-Partner: Not on file  . Emotionally Abused: Not on file  . Physically Abused: Not on file  . Sexually Abused: Not on file    Past Medical History, Surgical history, Social history, and Family history were reviewed and updated as appropriate.   Please see review of systems for further details on the patient's review from today.   Objective:   Physical Exam:  There were no vitals taken for this visit.  Physical Exam Neurological:     Mental Status: She is alert and oriented to person, place, and time.     Cranial Nerves: No dysarthria.  Psychiatric:        Attention and Perception: Attention and perception normal.        Speech: Speech normal.        Behavior: Behavior is cooperative.        Thought Content: Thought content normal. Thought content is not paranoid or delusional. Thought content does not include homicidal or suicidal ideation. Thought content does not include homicidal or suicidal plan.        Cognition and Memory: Cognition  and memory normal.        Judgment: Judgment normal.     Comments: Insight intact Elevated mood and hypersexual     Lab Review:     Component Value Date/Time   NA 139 12/26/2018 0145   NA 141 01/17/2017 1714   K 3.9 12/26/2018 0145   CL 106 12/26/2018 0145   CO2 20 (L) 12/26/2018 0145   GLUCOSE 141 (H) 12/26/2018 0145   BUN 11 12/26/2018 0145   BUN 10 01/17/2017 1714   CREATININE 0.71 12/26/2018 0145   CALCIUM 8.6 (L) 12/26/2018 0145   PROT 7.1 12/26/2018 0145   PROT 7.7 01/17/2017 1714   ALBUMIN 3.6 12/26/2018 0145   ALBUMIN 4.7 01/17/2017 1714   AST 19 12/26/2018 0145   ALT 20 12/26/2018 0145   ALKPHOS 65 12/26/2018 0145   BILITOT 0.3 12/26/2018 0145   BILITOT 0.3 01/17/2017 1714   GFRNONAA >60 12/26/2018 0145   GFRAA >60 12/26/2018 0145       Component Value Date/Time   WBC 12.4 (H) 12/26/2018 0145   RBC 4.63 12/26/2018 0145   HGB 12.6 12/26/2018 0145   HCT 38.1 12/26/2018 0145   PLT 310 12/26/2018 0145   MCV 82.3 12/26/2018 0145   MCH 27.2 12/26/2018 0145   MCHC 33.1 12/26/2018 0145   RDW 13.2 12/26/2018 0145    No results found for: POCLITH, LITHIUM   No results found for: PHENYTOIN, PHENOBARB, VALPROATE, CBMZ   .res Assessment: Plan:    Bipolar affective disorder, currently manic, moderate (HCC) - Plan: divalproex (DEPAKOTE ER) 500 MG 24 hr tablet  Attention deficit hyperactivity disorder (ADHD), combined type - Plan: methylphenidate (CONCERTA) 36 MG PO CR tablet, methylphenidate (CONCERTA) 36 MG PO CR tablet, methylphenidate (CONCERTA) 36 MG PO CR tablet  Panic disorder with agoraphobia  Bipolar I disorder (HCC)    Extensive discussion of therapist observation about her hypersexuality.  It appears to be associated with other hypomanic symptoms including racing thoughts and general hyperactivity.  No decreased need for sleep.  It does not also appear to wax and wane.  There was some discussion whether perhaps it  might be associated with Abilify but  it appears to be more hypomania.  Discussed options for treating hypomania including increasing the Abilify or switching to another mood stabilizer.  Is discussed those mood stabilizers options at length as well as her side effect risks.  BC sexual hyperactivity and sexual SE from Abilify will change mood stabilizer.  We will switch to Depakote and increase to 1000 mg nightly and stop Abilify.  Continue Concerta 36 mg daily.    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  There is only 1 fill of a prescription for lorazepam since August. Discussed it is not preferable to use benzodiazepines and stimulants at the same time on a regular basis.  Okay as needed lorazepam for rare panic attacks.  Disc fear flying in July coming up.  Asks for bz.  OK lorazepam.  We discussed the short-term risks associated with benzodiazepines including sedation and increased fall risk among others.  Discussed long-term side effect risk including dependence, potential withdrawal symptoms, and the potential eventual dose-related risk of dementia.  Will not drive at destination.  Ativan 0.5 mg 1-2 before trip if needed.  FU 2 mos  Lynder Parents, MD, DFAPA  Please see After Visit Summary for patient specific instructions.  Future Appointments  Date Time Provider [place]  11/02/2019 10:00 AM Philip Aspen, CNM [facility] None  11/09/2019  9:20 AM [facility]    No orders of the defined types were placed in this encounter.     -------------------------------

## 2019-10-12 ENCOUNTER — Other Ambulatory Visit: Payer: Self-pay | Admitting: Psychiatry

## 2019-10-12 DIAGNOSIS — F319 Bipolar disorder, unspecified: Secondary | ICD-10-CM

## 2019-10-14 ENCOUNTER — Other Ambulatory Visit: Payer: Self-pay | Admitting: Certified Nurse Midwife

## 2019-10-29 ENCOUNTER — Other Ambulatory Visit: Payer: Self-pay | Admitting: Psychiatry

## 2019-10-29 DIAGNOSIS — F3112 Bipolar disorder, current episode manic without psychotic features, moderate: Secondary | ICD-10-CM

## 2019-11-02 ENCOUNTER — Encounter: Payer: Self-pay | Admitting: Certified Nurse Midwife

## 2019-11-09 ENCOUNTER — Ambulatory Visit
Admission: RE | Admit: 2019-11-09 | Discharge: 2019-11-09 | Disposition: A | Discharge status: Home / Self Care | Payer: BC Managed Care – PPO | Source: Ambulatory Visit | Attending: Certified Nurse Midwife | Admitting: Certified Nurse Midwife

## 2019-11-09 DIAGNOSIS — Z1231 Encounter for screening mammogram for malignant neoplasm of breast: Secondary | ICD-10-CM | POA: Diagnosis not present

## 2019-11-11 ENCOUNTER — Other Ambulatory Visit: Payer: Self-pay | Admitting: Certified Nurse Midwife

## 2019-11-16 ENCOUNTER — Ambulatory Visit (INDEPENDENT_AMBULATORY_CARE_PROVIDER_SITE_OTHER): Payer: BC Managed Care – PPO | Admitting: Psychiatry

## 2019-11-16 ENCOUNTER — Other Ambulatory Visit: Payer: Self-pay

## 2019-11-16 ENCOUNTER — Encounter: Payer: Self-pay | Admitting: Psychiatry

## 2019-11-16 DIAGNOSIS — F4001 Agoraphobia with panic disorder: Secondary | ICD-10-CM | POA: Diagnosis not present

## 2019-11-16 DIAGNOSIS — F902 Attention-deficit hyperactivity disorder, combined type: Secondary | ICD-10-CM

## 2019-11-16 DIAGNOSIS — F3112 Bipolar disorder, current episode manic without psychotic features, moderate: Secondary | ICD-10-CM | POA: Diagnosis not present

## 2019-11-16 MED ORDER — LORAZEPAM 0.5 MG PO TABS: 0.5000 mg | ORAL_TABLET | Freq: Three times a day (TID) | ORAL | 0 refills | Status: DC | PRN

## 2019-11-16 MED ORDER — METHYLPHENIDATE HCL ER (OSM) 36 MG PO TBCR: 36.0000 mg | EXTENDED_RELEASE_TABLET | Freq: Every day | ORAL | 0 refills | Status: DC

## 2019-11-16 MED ORDER — DIVALPROEX SODIUM ER 250 MG PO TB24: 750.0000 mg | ORAL_TABLET | Freq: Every day | ORAL | 1 refills | Status: DC

## 2019-11-16 NOTE — Progress Notes (Signed)
Lisa Schroeder [id number] 1976-03-10 44 y.o.   Virtual Visit via Telephone Note  I connected with pt by telephone and verified that I am speaking with the correct person using two identifiers.   I discussed the limitations, risks, security and privacy concerns of performing an evaluation and management service by telephone and the availability of in person appointments. I also discussed with the patient that there may be a patient responsible charge related to this service. The patient expressed understanding and agreed to proceed.  I discussed the assessment and treatment plan with the patient. The patient was provided an opportunity to ask questions and all were answered. The patient agreed with the plan and demonstrated an understanding of the instructions.   The patient was advised to call back or seek an in-person evaluation if the symptoms worsen or if the condition fails to improve as anticipated.  I provided 15 minutes of non-face-to-face time during this encounter. The call started at 1030 and ended at 1100. The patient was located at home and the provider was located office.   Subjective:   Patient ID:  Lisa Schroeder is a 44 y.o. (DOB [birth year]) female.  Chief Complaint:  No chief complaint on file.   HPI Lisa Schroeder presents to the office today for follow-up of ADD and mood.  visit June 2020.  She could not afford Metadate so she was given a prescription for Concerta 36 mg daily.  No other med changes.  She remained on Abilify 5 mg daily, lamotrigine 200 mg daily.  She called May 22, 2019 stating that she was having panic attacks and asked for prescription for Ativan.  It was okayed for her to use sparingly.  Last seen October 05, 2019.  She described a pattern of hypersexuality as well as some other manic symptoms that she and her therapist had discussed.  We decided to switch from Abilify to Depakote as a mood stabilizer.  Did switch sx.  Episodes mania are gone  but more depression.  Couple weeks ago didn't want to leave the house.  Abilify did better with the depression. No manic sx in the last 3 weeks.  No further hypersexuality resolved.  Sleeping well.  Some racing thoughts but better.  Not irritable..  Some anxiety situational.    Concerta affordable.  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    Doing well without major issues.    Last visit changed to Metadate CD and that worked well but over $100.  Better energy and focus and more productive.  Tolerated well.  Ran out.  Dose seems good.  Does ok without stimulant but better focus and energy and happier on it.  Be nice if insurance would cover it.  Patient reports stable mood and denies depressed or irritable moods.  Patient denies any recent difficulty with anxiety.  Patient denies difficulty with sleep initiation or maintenance. Denies appetite disturbance.  Patient reports that energy and motivation have been good.  Patient denies any difficulty with concentration.  Patient denies any suicidal ideation.  Past Psychiatric Medication Trials: Citalopram, venlafaxine, Wellbutrin, lamotrigine, Latuda cost, Vraylar side effects, Abilify 5, lamotrigine 200, Ritalin, Rexulti  Review of Systems:  Review of Systems  Neurological: Negative for dizziness, tremors, weakness and headaches.  Psychiatric/Behavioral: Negative for agitation, behavioral problems, confusion, decreased concentration, dysphoric mood, hallucinations, self-injury, sleep disturbance and suicidal ideas. The patient is not nervous/anxious and is not hyperactive.     Medications: I  have reviewed the patient's current medications.  Current Outpatient Medications  Medication Sig Dispense Refill  . ARIPiprazole (ABILIFY) 5 MG tablet TAKE 1 TABLET BY MOUTH EVERY DAY 90 tablet 2  . divalproex (DEPAKOTE ER) 500 MG 24 hr tablet TAKE 2 TABLETS NIGHTLY 60 tablet 1  . lamoTRIgine (LAMICTAL) 100 MG tablet TAKE  2 TABLETS BY MOUTH EVERY DAY 180 tablet 1  . levothyroxine (SYNTHROID) 25 MCG tablet TK 1 TAB BY MOUTH EVERY MORNING AT LEAST 30 60 MINS BEFORE BREAKFAST W/ A GLASS OF WATER    . LORazepam (ATIVAN) 0.5 MG tablet Take 1 tablet (0.5 mg total) by mouth every 8 (eight) hours as needed for anxiety. 30 tablet 0  . methylphenidate (CONCERTA) 36 MG PO CR tablet Take 1 tablet (36 mg total) by mouth daily. 30 tablet 0  . methylphenidate (CONCERTA) 36 MG PO CR tablet Take 1 tablet (36 mg total) by mouth daily. 30 tablet 0  . [START ON 11/30/2019] methylphenidate (CONCERTA) 36 MG PO CR tablet Take 1 tablet (36 mg total) by mouth daily. 30 tablet 0  . TRI-LO-MARZIA 0.18/0.215/0.25 MG-25 MCG tab TAKE 1 TABLET BY MOUTH EVERY DAY 28 tablet 0   No current facility-administered medications for this visit.    Medication Side Effects: more tremor  Allergies: No Known Allergies  Past Medical History:  Diagnosis Date  . Abnormal Pap smear of cervix   . Depression   . HPV in female     Family History  Problem Relation Age of Onset  . Thyroid disease Mother   . Diabetes Father   . Heart disease Father   . Diabetes Paternal Grandfather   . Heart disease Paternal Grandfather     Social History   Socioeconomic History  . Marital status: Divorced    Spouse name: Not on file  . Number of children: Not on file  . Years of education: Not on file  . Highest education level: Not on file  Occupational History  . Not on file  Tobacco Use  . Smoking status: Never Smoker  . Smokeless tobacco: Never Used  Substance and Sexual Activity  . Alcohol use: Yes    Comment: rare  . Drug use: No  . Sexual activity: Yes    Birth control/protection: None    Comment: vasectomy  Other Topics Concern  . Not on file  Social History Narrative  . Not on file   Social Determinants of Health   Financial Resource Strain:   . Difficulty of Paying Living Expenses: Not on file  Food Insecurity:   . Worried About  Programme researcher, broadcasting/film/video in the Last Year: Not on file  . Ran Out of Food in the Last Year: Not on file  Transportation Needs:   . Lack of Transportation (Medical): Not on file  . Lack of Transportation (Non-Medical): Not on file  Physical Activity:   . Days of Exercise per Week: Not on file  . Minutes of Exercise per Session: Not on file  Stress:   . Feeling of Stress : Not on file  Social Connections:   . Frequency of Communication with Friends and Family: Not on file  . Frequency of Social Gatherings with Friends and Family: Not on file  . Attends Religious Services: Not on file  . Active Member of Clubs or Organizations: Not on file  . Attends Banker Meetings: Not on file  . Marital Status: Not on file  Intimate Partner Violence:   .  Fear of Current or Ex-Partner: Not on file  . Emotionally Abused: Not on file  . Physically Abused: Not on file  . Sexually Abused: Not on file    Past Medical History, Surgical history, Social history, and Family history were reviewed and updated as appropriate.   Please see review of systems for further details on the patient's review from today.   Objective:   Physical Exam:  There were no vitals taken for this visit.  Physical Exam Neurological:     Mental Status: She is alert and oriented to person, place, and time.     Cranial Nerves: No dysarthria.  Psychiatric:        Attention and Perception: Attention and perception normal.        Speech: Speech normal.        Behavior: Behavior is cooperative.        Thought Content: Thought content normal. Thought content is not paranoid or delusional. Thought content does not include homicidal or suicidal ideation. Thought content does not include homicidal or suicidal plan.        Cognition and Memory: Cognition and memory normal.        Judgment: Judgment normal.     Comments: Insight intact Elevated mood and hypersexual     Lab Review:     Component Value Date/Time   NA 139  12/26/2018 0145   NA 141 01/17/2017 1714   K 3.9 12/26/2018 0145   CL 106 12/26/2018 0145   CO2 20 (L) 12/26/2018 0145   GLUCOSE 141 (H) 12/26/2018 0145   BUN 11 12/26/2018 0145   BUN 10 01/17/2017 1714   CREATININE 0.71 12/26/2018 0145   CALCIUM 8.6 (L) 12/26/2018 0145   PROT 7.1 12/26/2018 0145   PROT 7.7 01/17/2017 1714   ALBUMIN 3.6 12/26/2018 0145   ALBUMIN 4.7 01/17/2017 1714   AST 19 12/26/2018 0145   ALT 20 12/26/2018 0145   ALKPHOS 65 12/26/2018 0145   BILITOT 0.3 12/26/2018 0145   BILITOT 0.3 01/17/2017 1714   GFRNONAA >60 12/26/2018 0145   GFRAA >60 12/26/2018 0145       Component Value Date/Time   WBC 12.4 (H) 12/26/2018 0145   RBC 4.63 12/26/2018 0145   HGB 12.6 12/26/2018 0145   HCT 38.1 12/26/2018 0145   PLT 310 12/26/2018 0145   MCV 82.3 12/26/2018 0145   MCH 27.2 12/26/2018 0145   MCHC 33.1 12/26/2018 0145   RDW 13.2 12/26/2018 0145    No results found for: POCLITH, LITHIUM   No results found for: PHENYTOIN, PHENOBARB, VALPROATE, CBMZ   .res Assessment: Plan:    No diagnosis found.    Extensive discussion of therapist observation about her hypersexuality.  It appears to be associated with other hypomanic symptoms including racing thoughts and general hyperactivity.  No decreased need for sleep.  It does not also appear to wax and wane.  There was some discussion whether perhaps it might be associated with Abilify but it appears to be more hypomania.  Discussed options for treating hypomania including increasing the Abilify or switching to another mood stabilizer.  Is discussed those mood stabilizers options at length as well as her side effect risks.  BC sexual hyperactivity and sexual SE from Abilify will change mood stabilizer.  We switched to Depakote and increase to 1000 mg nightly and stopped Abilify. Unfortunately she's been more depressed and has a tremor.  Increase lamotrigine to 150 mg daily to help with depression  BC tremor reduce  Depakote  to 750 mg HS.  Disc risk return of mania.  Continue Concerta 36 mg daily.    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  There is only 1 fill of a prescription for lorazepam since August. Discussed it is not preferable to use benzodiazepines and stimulants at the same time on a regular basis.  Okay as needed lorazepam for rare panic attacks.  Disc fear flying in July coming up.  Asks for bz.  OK lorazepam.  We discussed the short-term risks associated with benzodiazepines including sedation and increased fall risk among others.  Discussed long-term side effect risk including dependence, potential withdrawal symptoms, and the potential eventual dose-related risk of dementia.  Will not drive at destination.  Ativan 0.5 mg 1-2 before trip if needed.  FU 2 mos  Lynder Parents, MD, DFAPA  Please see After Visit Summary for patient specific instructions.  Future Appointments  Date Time Provider [place]  01/04/2020  2:15 PM [place], Lara Mulch, CNM [facility] None    No orders of the defined types were placed in this encounter.     -------------------------------

## 2019-12-04 ENCOUNTER — Other Ambulatory Visit: Payer: Self-pay | Admitting: Psychiatry

## 2019-12-04 DIAGNOSIS — F3112 Bipolar disorder, current episode manic without psychotic features, moderate: Secondary | ICD-10-CM

## 2019-12-09 ENCOUNTER — Other Ambulatory Visit: Payer: Self-pay | Admitting: Certified Nurse Midwife

## 2019-12-11 ENCOUNTER — Other Ambulatory Visit: Payer: Self-pay | Admitting: Psychiatry

## 2019-12-11 DIAGNOSIS — F3112 Bipolar disorder, current episode manic without psychotic features, moderate: Secondary | ICD-10-CM

## 2019-12-17 ENCOUNTER — Other Ambulatory Visit: Payer: Self-pay | Admitting: Psychiatry

## 2019-12-17 DIAGNOSIS — F319 Bipolar disorder, unspecified: Secondary | ICD-10-CM

## 2020-01-04 ENCOUNTER — Ambulatory Visit: Payer: BC Managed Care – PPO | Admitting: Psychiatry

## 2020-01-04 ENCOUNTER — Ambulatory Visit (INDEPENDENT_AMBULATORY_CARE_PROVIDER_SITE_OTHER): Payer: BC Managed Care – PPO | Admitting: Certified Nurse Midwife

## 2020-01-04 ENCOUNTER — Other Ambulatory Visit: Payer: Self-pay

## 2020-01-04 ENCOUNTER — Other Ambulatory Visit (HOSPITAL_COMMUNITY)
Admission: RE | Admit: 2020-01-04 | Discharge: 2020-01-04 | Disposition: A | Discharge status: Home / Self Care | Payer: BC Managed Care – PPO | Source: Ambulatory Visit | Attending: Certified Nurse Midwife | Admitting: Certified Nurse Midwife

## 2020-01-04 ENCOUNTER — Encounter: Payer: Self-pay | Admitting: Certified Nurse Midwife

## 2020-01-04 VITALS — BP 115/76 | HR 82 | Ht 66.0 in | Wt 220.2 lb

## 2020-01-04 DIAGNOSIS — Z23 Encounter for immunization: Secondary | ICD-10-CM | POA: Diagnosis not present

## 2020-01-04 DIAGNOSIS — Z124 Encounter for screening for malignant neoplasm of cervix: Secondary | ICD-10-CM | POA: Insufficient documentation

## 2020-01-04 DIAGNOSIS — Z1231 Encounter for screening mammogram for malignant neoplasm of breast: Secondary | ICD-10-CM

## 2020-01-04 DIAGNOSIS — Z8619 Personal history of other infectious and parasitic diseases: Secondary | ICD-10-CM | POA: Diagnosis not present

## 2020-01-04 DIAGNOSIS — Z01419 Encounter for gynecological examination (general) (routine) without abnormal findings: Secondary | ICD-10-CM | POA: Insufficient documentation

## 2020-01-04 DIAGNOSIS — Z3041 Encounter for surveillance of contraceptive pills: Secondary | ICD-10-CM

## 2020-01-04 MED ORDER — TETANUS-DIPHTH-ACELL PERTUSSIS 5-2.5-18.5 LF-MCG/0.5 IM SUSP
0.5000 mL | Freq: Once | INTRAMUSCULAR | Status: AC
  Administered 2020-01-04: 16:00:00 0.5 mL via INTRAMUSCULAR

## 2020-01-04 MED ORDER — NORGESTIM-ETH ESTRAD TRIPHASIC 0.18/0.215/0.25 MG-25 MCG PO TABS: 1.0000 | ORAL_TABLET | Freq: Every day | ORAL | 3 refills | Status: DC

## 2020-01-04 NOTE — Progress Notes (Signed)
ANNUAL PREVENTATIVE CARE GYN  ENCOUNTER NOTE  Subjective:       Lisa Schroeder is a 44 y.o. G53P1001 female here for a routine annual gynecologic exam.  Current complaints: 1. Needs a Pap smear  Denies difficulty breathing or respiratory distress, chest pain, abdominal pain, excessive vaginal bleeding, dysuria, and leg pain or swelling.    Gynecologic History  Patient's last menstrual period was 12/20/2019 (exact date). Period Cycle (Days): 28 Period Duration (Days): 5 Period Pattern: Regular Menstrual Flow: Light, Moderate, Heavy Menstrual Control: Tampon, Maxi pad Dysmenorrhea: None  Contraception: OCP (estrogen/progesterone), Tri Lo Marzia  Last Pap: 07/2018. Results were: abnormal, Neg/HPV+  Last mammogram: 10/2019. Results were: BI-RADS 1  Obstetric History  OB History  Gravida Para Term Preterm AB Living  1 1 1     1   SAB TAB Ectopic Multiple Live Births          1    # Outcome Date GA Lbr Len/2nd Weight Sex Delivery Anes PTL Lv  1 Term 12/23/05   7 lb 2 oz (3.232 kg) F Vag-Spont  N LIV    Past Medical History:  Diagnosis Date  . Abnormal Pap smear of cervix   . Depression   . HPV in female     Past Surgical History:  Procedure Laterality Date  . COLPOSCOPY     for many yrs per pt    Current Outpatient Medications on File Prior to Visit  Medication Sig Dispense Refill  . divalproex (DEPAKOTE ER) 250 MG 24 hr tablet TAKE 3 TABLETS (750 MG TOTAL) BY MOUTH DAILY. 90 tablet 1  . lamoTRIgine (LAMICTAL) 100 MG tablet TAKE 2 TABLETS BY MOUTH EVERY DAY (Patient taking differently: Take 100 mg by mouth daily. ) 180 tablet 1  . levothyroxine (SYNTHROID) 25 MCG tablet TK 1 TAB BY MOUTH EVERY MORNING AT LEAST 30 60 MINS BEFORE BREAKFAST W/ A GLASS OF WATER    . LORazepam (ATIVAN) 0.5 MG tablet Take 1 tablet (0.5 mg total) by mouth every 8 (eight) hours as needed for anxiety. 30 tablet 0  . methylphenidate (CONCERTA) 36 MG PO CR tablet Take 1 tablet (36 mg total) by  mouth daily. 30 tablet 0  . TRI-LO-MARZIA 0.18/0.215/0.25 MG-25 MCG tab TAKE 1 TABLET BY MOUTH EVERY DAY 28 tablet 0  . methylphenidate (CONCERTA) 36 MG PO CR tablet Take 1 tablet (36 mg total) by mouth daily. (Patient not taking: Reported on 01/04/2020) 30 tablet 0  . methylphenidate (CONCERTA) 36 MG PO CR tablet Take 1 tablet (36 mg total) by mouth daily. (Patient not taking: Reported on 01/04/2020) 30 tablet 0   No current facility-administered medications on file prior to visit.    No Known Allergies  Social History   Socioeconomic History  . Marital status: Divorced    Spouse name: Not on file  . Number of children: Not on file  . Years of education: Not on file  . Highest education level: Not on file  Occupational History  . Not on file  Tobacco Use  . Smoking status: Never Smoker  . Smokeless tobacco: Never Used  Substance and Sexual Activity  . Alcohol use: Yes    Comment: rare  . Drug use: No  . Sexual activity: Yes    Birth control/protection: Pill    Comment: vasectomy  Other Topics Concern  . Not on file  Social History Narrative  . Not on file   Social Determinants of Health   Financial Resource Strain:   .  Difficulty of Paying Living Expenses:   Food Insecurity:   . Worried About Programme researcher, broadcasting/film/video in the Last Year:   . Barista in the Last Year:   Transportation Needs:   . Freight forwarder (Medical):   Marland Kitchen Lack of Transportation (Non-Medical):   Physical Activity:   . Days of Exercise per Week:   . Minutes of Exercise per Session:   Stress:   . Feeling of Stress :   Social Connections:   . Frequency of Communication with Friends and Family:   . Frequency of Social Gatherings with Friends and Family:   . Attends Religious Services:   . Active Member of Clubs or Organizations:   . Attends Banker Meetings:   Marland Kitchen Marital Status:   Intimate Partner Violence:   . Fear of Current or Ex-Partner:   . Emotionally Abused:   Marland Kitchen  Physically Abused:   . Sexually Abused:     Family History  Problem Relation Age of Onset  . Thyroid disease Mother   . Diabetes Father   . Heart disease Father   . Diabetes Paternal Grandfather   . Heart disease Paternal Grandfather     The following portions of the patient's history were reviewed and updated as appropriate: allergies, current medications, past family history, past medical history, past social history, past surgical history and problem list.  Review of Systems  ROS negative except as noted above. Information obtained from patient.    Objective:   BP 115/76   Pulse 82   Ht 5\' 6"  (1.676 m)   Wt 220 lb 4 oz (99.9 kg)   LMP 12/20/2019 (Exact Date)   BMI 35.55 kg/m   CONSTITUTIONAL: Well-developed, well-nourished female in no acute distress.   PSYCHIATRIC: Normal mood and affect. Normal behavior. Normal judgment and thought content.  NEUROLGIC: Alert and oriented to person, place, and time. Normal muscle tone coordination. No cranial nerve deficit noted.  HENT:  Normocephalic, atraumatic, External right and left ear normal.   EYES: Conjunctivae and EOM are normal. Pupils are equal and round.   NECK: Normal range of motion, supple, no masses.  Normal thyroid.   SKIN: Skin is warm and dry. No rash noted. Not diaphoretic. No erythema. No pallor.  CARDIOVASCULAR: Normal heart rate noted, regular rhythm, no murmur.  RESPIRATORY: Clear to auscultation bilaterally. Effort and breath sounds normal, no problems with respiration noted.  BREASTS: Symmetric in size. No masses, skin changes, nipple drainage, or lymphadenopathy.  ABDOMEN: Soft, normal bowel sounds, no distention noted.  No tenderness, rebound or guarding.   PELVIC:  External Genitalia: Normal  Vagina: Normal  Cervix: Normal, Pap collected  Uterus: Normal  Adnexa: Normal   MUSCULOSKELETAL: Normal range of motion. No tenderness.  No cyanosis, clubbing, or edema.  2+ distal pulses.  LYMPHATIC:  No Axillary, Supraclavicular, or Inguinal Adenopathy.  Assessment:   Annual gynecologic examination 44 y.o.   Contraception: OCP (estrogen/progesterone), Tri Lo Marzia   Obesity 2   Problem List Items Addressed This Visit    None    Visit Diagnoses    Well woman exam    -  Primary   Screening for cervical cancer       History of HPV infection       Encounter for screening mammogram for malignant neoplasm of breast          Plan:   Pap: Pap Co Test   Mammogram: Ordered   Labs: Managed by PCP  Routine preventative health maintenance measures emphasized: Exercise/Diet/Weight control, Tobacco Warnings, Alcohol/Substance use risks and Stress Management; see AVS  Rx Tri Lo Marzia, see orders  Reviewed red flag symptoms and when to call  Return to Clinic - 1 Year   Gunnar Bulla, CNM [facility], [facility] 01/04/20 2:38 PM

## 2020-01-04 NOTE — Addendum Note (Signed)
Addended by: Brooke Dare on: 01/04/2020 03:42 PM   Modules accepted: Orders

## 2020-01-04 NOTE — Patient Instructions (Signed)
HPV Test Why am I having this test? HPV (human papillomavirus) refers to a group of about 100 viruses. Many of these viruses cause growths on, in, or around the genitals. Most HPV viruses cause infections that usually go away without treatment.  The HPV test checks for high-risk types (strains) of HPV. Strains 16 and 18 are considered the most high-risk for cancer. If you have strain 16 or 18 HPV and it is not treated, it can increase your risk for cancer of the cervix, vagina, vulva, or anus. HPV can be found in both males and females. However, the HPV test is used to screen for increased cancer risk in females:  Who are 19-75 years old.  Who have an abnormal Pap test.  Who have been treated for an abnormal Pap test in the past.  Who have been treated for a high-risk HPV infection in the past. If you are a woman older than 30, you may have the HPV test at the same time as a pelvic exam and Pap test. What is being tested? This test checks for the DNA (genetic) strands of the HPV infection. This test is also called the HPV DNA test. What kind of sample is taken?  This test requires a sample of cells from the cervix. This will be done using a small cotton swab, plastic spatula, or brush. This sample is often collected during a pelvic exam, when you are lying on your back on an exam table with feet in footrests (stirrups). How do I prepare for this test?  Starting 24-48 hours before your test, or as told by your health care provider, do not: ? Take a bath. ? Have sex. ? Douche.  Schedule the test for a day when you are not menstruating. If you are menstruating on the day of the test, you may need to reschedule.  You will be asked to urinate right before the test. How are the results reported? Your test results will be reported as either positive or negative for HPV. If you have a positive result, the results will indicate which HPV strain you are positive for. What do the results mean? A  negative HPV test result means that no HPV was found. This means it is very likely that you do not have HPV. A positive HPV test result means that you have HPV.  If your results show the presence of any high-risk strains, you may have a higher risk of developing anal or cervical cancer if your infection is not treated.  If any low-risk HPV strains are found, it is not likely that you have an increased risk for cancer. Talk with your health care provider about what your results mean. Questions to ask your health care provider Ask your health care provider, or the department that is doing the test:  When will my results be ready?  How will I get my results?  What are my treatment options?  What other tests do I need?  What are my next steps? Summary  The human papillomavirus (HPV) test is used to look for high-risk types of HPV infection. This test is done only for females.  HPV types 16 and 18 are considered high-risk types of HPV. If untreated, these types of infections increase your risk for cancer of the cervix or anus.  A negative HPV test result means that no HPV was found, and it is very likely that you do not have HPV.  A positive HPV test result means that you  have an HPV infection. This information is not intended to replace advice given to you by your health care provider. Make sure you discuss any questions you have with your health care provider. Document Revised: 08/26/2017 Document Reviewed: 07/26/2017 Elsevier Patient Education  2020 Elsevier Inc.   Preventive Care 64-93 Years Old, Female Preventive care refers to visits with your health care provider and lifestyle choices that can promote health and wellness. This includes:  A yearly physical exam. This may also be called an annual well check.  Regular dental visits and eye exams.  Immunizations.  Screening for certain conditions.  Healthy lifestyle choices, such as eating a healthy diet, getting regular  exercise, not using drugs or products that contain nicotine and tobacco, and limiting alcohol use. What can I expect for my preventive care visit? Physical exam Your health care provider will check your:  Height and weight. This may be used to calculate body mass index (BMI), which tells if you are at a healthy weight.  Heart rate and blood pressure.  Skin for abnormal spots. Counseling Your health care provider may ask you questions about your:  Alcohol, tobacco, and drug use.  Emotional well-being.  Home and relationship well-being.  Sexual activity.  Eating habits.  Work and work Statistician.  Method of birth control.  Menstrual cycle.  Pregnancy history. What immunizations do I need?  Influenza (flu) vaccine  This is recommended every year. Tetanus, diphtheria, and pertussis (Tdap) vaccine  You may need a Td booster every 10 years. Varicella (chickenpox) vaccine  You may need this if you have not been vaccinated. Zoster (shingles) vaccine  You may need this after age 39. Measles, mumps, and rubella (MMR) vaccine  You may need at least one dose of MMR if you were born in 1957 or later. You may also need a second dose. Pneumococcal conjugate (PCV13) vaccine  You may need this if you have certain conditions and were not previously vaccinated. Pneumococcal polysaccharide (PPSV23) vaccine  You may need one or two doses if you smoke cigarettes or if you have certain conditions. Meningococcal conjugate (MenACWY) vaccine  You may need this if you have certain conditions. Hepatitis A vaccine  You may need this if you have certain conditions or if you travel or work in places where you may be exposed to hepatitis A. Hepatitis B vaccine  You may need this if you have certain conditions or if you travel or work in places where you may be exposed to hepatitis B. Haemophilus influenzae type b (Hib) vaccine  You may need this if you have certain conditions. Human  papillomavirus (HPV) vaccine  If recommended by your health care provider, you may need three doses over 6 months. You may receive vaccines as individual doses or as more than one vaccine together in one shot (combination vaccines). Talk with your health care provider about the risks and benefits of combination vaccines. What tests do I need? Blood tests  Lipid and cholesterol levels. These may be checked every 5 years, or more frequently if you are over 9 years old.  Hepatitis C test.  Hepatitis B test. Screening  Lung cancer screening. You may have this screening every year starting at age 63 if you have a 30-pack-year history of smoking and currently smoke or have quit within the past 15 years.  Colorectal cancer screening. All adults should have this screening starting at age 13 and continuing until age 43. Your health care provider may recommend screening at age  45 if you are at increased risk. You will have tests every 1-10 years, depending on your results and the type of screening test.  Diabetes screening. This is done by checking your blood sugar (glucose) after you have not eaten for a while (fasting). You may have this done every 1-3 years.  Mammogram. This may be done every 1-2 years. Talk with your health care provider about when you should start having regular mammograms. This may depend on whether you have a family history of breast cancer.  BRCA-related cancer screening. This may be done if you have a family history of breast, ovarian, tubal, or peritoneal cancers.  Pelvic exam and Pap test. This may be done every 3 years starting at age 91. Starting at age 79, this may be done every 5 years if you have a Pap test in combination with an HPV test. Other tests  Sexually transmitted disease (STD) testing.  Bone density scan. This is done to screen for osteoporosis. You may have this scan if you are at high risk for osteoporosis. Follow these instructions at home: Eating  and drinking  Eat a diet that includes fresh fruits and vegetables, whole grains, lean protein, and low-fat dairy.  Take vitamin and mineral supplements as recommended by your health care provider.  Do not drink alcohol if: ? Your health care provider tells you not to drink. ? You are pregnant, may be pregnant, or are planning to become pregnant.  If you drink alcohol: ? Limit how much you have to 0-1 drink a day. ? Be aware of how much alcohol is in your drink. In the [place] one drink equals one 12 oz bottle of beer (355 mL), one 5 oz glass of wine (148 mL), or one 1 oz glass of hard liquor (44 mL). Lifestyle  Take daily care of your teeth and gums.  Stay active. Exercise for at least 30 minutes on 5 or more days each week.  Do not use any products that contain nicotine or tobacco, such as cigarettes, e-cigarettes, and chewing tobacco. If you need help quitting, ask your health care provider.  If you are sexually active, practice safe sex. Use a condom or other form of birth control (contraception) in order to prevent pregnancy and STIs (sexually transmitted infections).  If told by your health care provider, take low-dose aspirin daily starting at age 68. What's next?  Visit your health care provider once a year for a well check visit.  Ask your health care provider how often you should have your eyes and teeth checked.  Stay up to date on all vaccines. This information is not intended to replace advice given to you by your health care provider. Make sure you discuss any questions you have with your health care provider. Document Revised: 05/25/2018 Document Reviewed: 05/25/2018 Elsevier Patient Education  2020 [employer].

## 2020-01-08 ENCOUNTER — Other Ambulatory Visit: Payer: Self-pay | Admitting: Psychiatry

## 2020-01-08 DIAGNOSIS — F3112 Bipolar disorder, current episode manic without psychotic features, moderate: Secondary | ICD-10-CM

## 2020-01-10 LAB — CYTOLOGY - PAP
Comment: NEGATIVE
Comment: NEGATIVE
Comment: NEGATIVE
HPV 16: NEGATIVE
HPV 18 / 45: NEGATIVE
High risk HPV: POSITIVE — AB

## 2020-01-11 ENCOUNTER — Telehealth: Payer: Self-pay | Admitting: Psychiatry

## 2020-01-11 ENCOUNTER — Other Ambulatory Visit: Payer: Self-pay

## 2020-01-11 ENCOUNTER — Other Ambulatory Visit: Payer: Self-pay | Admitting: Otolaryngology

## 2020-01-11 DIAGNOSIS — F902 Attention-deficit hyperactivity disorder, combined type: Secondary | ICD-10-CM

## 2020-01-11 DIAGNOSIS — R221 Localized swelling, mass and lump, neck: Secondary | ICD-10-CM

## 2020-01-11 NOTE — Telephone Encounter (Signed)
Spoke with the pharmacy and she already has one on file they will fill it for her today

## 2020-01-11 NOTE — Telephone Encounter (Signed)
Lisa Schroeder called to request refill of her Concerta.  Made appt for 02/22/20.  [facility] to [facility] [address] in [place]

## 2020-01-18 ENCOUNTER — Other Ambulatory Visit: Payer: Self-pay

## 2020-01-18 ENCOUNTER — Ambulatory Visit
Admission: RE | Admit: 2020-01-18 | Discharge: 2020-01-18 | Disposition: A | Discharge status: Home / Self Care | Payer: BC Managed Care – PPO | Source: Ambulatory Visit | Attending: Otolaryngology | Admitting: Otolaryngology

## 2020-01-18 DIAGNOSIS — R221 Localized swelling, mass and lump, neck: Secondary | ICD-10-CM | POA: Insufficient documentation

## 2020-02-02 ENCOUNTER — Other Ambulatory Visit: Payer: Self-pay | Admitting: Psychiatry

## 2020-02-02 DIAGNOSIS — F3112 Bipolar disorder, current episode manic without psychotic features, moderate: Secondary | ICD-10-CM

## 2020-02-11 ENCOUNTER — Other Ambulatory Visit: Payer: Self-pay | Admitting: Otolaryngology

## 2020-02-11 DIAGNOSIS — R599 Enlarged lymph nodes, unspecified: Secondary | ICD-10-CM

## 2020-02-12 ENCOUNTER — Telehealth: Payer: Self-pay | Admitting: Psychiatry

## 2020-02-12 ENCOUNTER — Encounter: Payer: BC Managed Care – PPO | Admitting: Obstetrics and Gynecology

## 2020-02-12 NOTE — Telephone Encounter (Signed)
I forgot to add that if you call her before 4:00, please call her work # [phone] and just ask for her

## 2020-02-12 NOTE — Telephone Encounter (Signed)
Lisa Schroeder called to RS her appt we cancel for 5/28.  She is now scheduled for 7/2.  She was wanting to discuss taking her Depakote while trying to get pregnant.  She is thinking about trying again and wants to discuss medications and pregnancy.  Please call to discuss.  Also, she needs a refill of her Ativan. [facility] [address] [facility] [place]

## 2020-02-15 ENCOUNTER — Ambulatory Visit: Payer: BC Managed Care – PPO

## 2020-02-15 ENCOUNTER — Encounter: Payer: BC Managed Care – PPO | Admitting: Obstetrics and Gynecology

## 2020-02-19 ENCOUNTER — Other Ambulatory Visit: Payer: Self-pay | Admitting: Psychiatry

## 2020-02-19 DIAGNOSIS — F4001 Agoraphobia with panic disorder: Secondary | ICD-10-CM

## 2020-02-19 MED ORDER — LORAZEPAM 0.5 MG PO TABS: 0.5000 mg | ORAL_TABLET | Freq: Three times a day (TID) | ORAL | 0 refills | Status: DC | PRN

## 2020-02-19 NOTE — Telephone Encounter (Signed)
It is unlikely I am going to be able to call her before 4:00.  My days are packed with patient's and a return phone because after 530.  She needs to schedule an appointment.  Then we will have a chance to discuss this and discussed the alternatives to Depakote.  Depakote is not safe in pregnancy and she cannot take benzodiazepines in pregnancy either.  Depakote is one of the most unsafe medicines in pregnancy and she will need to be off of it before becoming pregnant.  It can cause birth defects such as spina bifida as well as cognitive problems.  However Depakote should never be stopped abruptly and she will need an alternative mood stabilizer.  Thus the reason for scheduling the appointment to discuss what the options are.  This needs to be done in an appointment not over the phone.

## 2020-02-20 ENCOUNTER — Other Ambulatory Visit: Payer: Self-pay

## 2020-02-20 ENCOUNTER — Ambulatory Visit
Admission: RE | Admit: 2020-02-20 | Discharge: 2020-02-20 | Disposition: A | Discharge status: Home / Self Care | Payer: BC Managed Care – PPO | Source: Ambulatory Visit | Attending: Otolaryngology | Admitting: Otolaryngology

## 2020-02-20 DIAGNOSIS — R591 Generalized enlarged lymph nodes: Secondary | ICD-10-CM | POA: Insufficient documentation

## 2020-02-20 DIAGNOSIS — R599 Enlarged lymph nodes, unspecified: Secondary | ICD-10-CM

## 2020-02-20 NOTE — Progress Notes (Signed)
Dr. Archer Asa in at bedside to speak with pt. Re: procedure. Pt. Verbalizes complete understanding of conversation. Pt. Chooses not to have moderate sedation at this point during conversation.

## 2020-02-20 NOTE — Procedures (Signed)
Interventional Radiology Procedure Note  Procedure: US guided core biopsy of LEFT cervical LN.   Complications: None  Estimated Blood Loss: None  Recommendations: - DC home   Signed,  Sterling Big, MD

## 2020-02-22 ENCOUNTER — Ambulatory Visit: Payer: BC Managed Care – PPO | Admitting: Psychiatry

## 2020-02-22 LAB — SURGICAL PATHOLOGY

## 2020-03-04 ENCOUNTER — Other Ambulatory Visit: Payer: Self-pay

## 2020-03-04 ENCOUNTER — Telehealth: Payer: Self-pay | Admitting: Psychiatry

## 2020-03-04 DIAGNOSIS — F902 Attention-deficit hyperactivity disorder, combined type: Secondary | ICD-10-CM

## 2020-03-04 MED ORDER — METHYLPHENIDATE HCL ER (OSM) 36 MG PO TBCR: 36.0000 mg | EXTENDED_RELEASE_TABLET | Freq: Every day | ORAL | 0 refills | Status: DC

## 2020-03-04 NOTE — Telephone Encounter (Signed)
Pt requests RF [facility]a [facility]. Has Appt.

## 2020-03-04 NOTE — Telephone Encounter (Signed)
Last refill 01/11/2020, pended for Dr. Jennelle Human to review and send. Has apt in July

## 2020-03-05 NOTE — Telephone Encounter (Signed)
She will have to come in for an appointment to switch her off of Depakote.  This is a major medication change and I will not do it via phone call.  I will agree to see her on a Friday but not as a work in.

## 2020-03-07 ENCOUNTER — Ambulatory Visit (INDEPENDENT_AMBULATORY_CARE_PROVIDER_SITE_OTHER): Payer: BC Managed Care – PPO | Admitting: Obstetrics and Gynecology

## 2020-03-07 ENCOUNTER — Encounter: Payer: BC Managed Care – PPO | Admitting: Obstetrics and Gynecology

## 2020-03-07 ENCOUNTER — Other Ambulatory Visit: Payer: Self-pay

## 2020-03-07 ENCOUNTER — Encounter: Payer: Self-pay | Admitting: Obstetrics and Gynecology

## 2020-03-07 ENCOUNTER — Other Ambulatory Visit (HOSPITAL_COMMUNITY)
Admission: RE | Admit: 2020-03-07 | Discharge: 2020-03-07 | Disposition: A | Discharge status: Home / Self Care | Payer: BC Managed Care – PPO | Source: Ambulatory Visit | Attending: Obstetrics and Gynecology | Admitting: Obstetrics and Gynecology

## 2020-03-07 VITALS — BP 111/74 | HR 85 | Ht 65.0 in | Wt 227.3 lb

## 2020-03-07 DIAGNOSIS — R87612 Low grade squamous intraepithelial lesion on cytologic smear of cervix (LGSIL): Secondary | ICD-10-CM | POA: Diagnosis present

## 2020-03-07 NOTE — Progress Notes (Signed)
    GYNECOLOGY CLINIC COLPOSCOPY PROCEDURE NOTE  44 y.o. G1P1001 here for colposcopy for low-grade squamous intraepithelial neoplasia (LGSIL - encompassing HPV,mild dysplasia,CIN I) pap smear on 01/04/2020. Also with NILM pap but HR HPV + pap smear in 2019.  Discussed role for HPV in cervical dysplasia, need for surveillance.  Patient given informed consent, signed copy in the chart, time out was performed.  Placed in lithotomy position. Cervix viewed with speculum and colposcope after application of acetic acid.   Colposcopy adequate? Yes  no mosaicism, no punctation, no abnormal vasculature and acetowhite lesion(s) at 2 and 5-6 o'clock; corresponding biopsies obtained.  ECC specimen obtained. All specimens were labeled and sent to pathology.  Patient was given post procedure instructions.  Will follow up pathology and manage accordingly; patient will be contacted with results and recommendations.  Routine preventative health maintenance measures emphasized.   Patient also inquires into possibility of pregnancy in the next year as she and her new fiance does not have any children (she has 1 from a previous relationship). Briefly discussed beginning PNV, additional folic acid, increased genetic risk due to age (also fiance is 77). Will be seeing her therapist next month to discuss discontinuing Depakote.    Hildred Laser, MD [facility]

## 2020-03-07 NOTE — Patient Instructions (Signed)

## 2020-03-07 NOTE — Progress Notes (Signed)
Pt present for colpo.  Pt would like to discuss fertility issues. Pt stated that she is going well no problems.

## 2020-03-07 NOTE — Addendum Note (Signed)
Addended by: Silvano Bilis on: 03/07/2020 04:17 PM   Modules accepted: Orders

## 2020-03-10 ENCOUNTER — Other Ambulatory Visit: Payer: Self-pay | Admitting: Psychiatry

## 2020-03-10 DIAGNOSIS — F3112 Bipolar disorder, current episode manic without psychotic features, moderate: Secondary | ICD-10-CM

## 2020-03-11 LAB — SURGICAL PATHOLOGY

## 2020-03-11 NOTE — Telephone Encounter (Signed)
She is schedujled.

## 2020-03-28 ENCOUNTER — Other Ambulatory Visit: Payer: Self-pay

## 2020-03-28 ENCOUNTER — Ambulatory Visit (INDEPENDENT_AMBULATORY_CARE_PROVIDER_SITE_OTHER): Payer: BC Managed Care – PPO | Admitting: Psychiatry

## 2020-03-28 ENCOUNTER — Encounter: Payer: Self-pay | Admitting: Psychiatry

## 2020-03-28 DIAGNOSIS — F3112 Bipolar disorder, current episode manic without psychotic features, moderate: Secondary | ICD-10-CM | POA: Diagnosis not present

## 2020-03-28 DIAGNOSIS — F4001 Agoraphobia with panic disorder: Secondary | ICD-10-CM | POA: Diagnosis not present

## 2020-03-28 DIAGNOSIS — F902 Attention-deficit hyperactivity disorder, combined type: Secondary | ICD-10-CM | POA: Diagnosis not present

## 2020-03-28 DIAGNOSIS — F319 Bipolar disorder, unspecified: Secondary | ICD-10-CM | POA: Diagnosis not present

## 2020-03-28 MED ORDER — LAMOTRIGINE 100 MG PO TABS: 200.0000 mg | ORAL_TABLET | Freq: Every day | ORAL | 1 refills | Status: DC

## 2020-03-28 NOTE — Patient Instructions (Signed)
Reduce Depakote to 2 tablets daily for 5 days, Then reduce to 1 daily for 5 days, Then stop it.

## 2020-03-28 NOTE — Progress Notes (Addendum)
Lisa Schroeder [id number] 1975/10/23 44 y.o.    Subjective:   Patient ID:  Lisa Schroeder is a 44 y.o. (DOB [birth year]) female.  Chief Complaint:  Chief Complaint  Patient presents with  . Follow-up  . Depression  . Medication Problem    HPI Lisa Schroeder presents to the office today for follow-up of ADD and mood.  visit June 2020.  She could not afford Metadate so she was given a prescription for Concerta 36 mg daily.  No other med changes.  She remained on Abilify 5 mg daily, lamotrigine 200 mg daily.  She called May 22, 2019 stating that she was having panic attacks and asked for prescription for Ativan.  It was okayed for her to use sparingly.  seen October 05, 2019.  She described a pattern of hypersexuality as well as some other manic symptoms that she and her therapist had discussed.  We decided to switch from Abilify to Depakote as a mood stabilizer.  11/16/2019 appointment with the following noted: Did switch sx.  Episodes mania are gone but more depression.  Couple weeks ago didn't want to leave the house.  Abilify did better with the depression. No manic sx in the last 3 weeks.  No further hypersexuality resolved.  Sleeping well.  Some racing thoughts but better.  Not irritable..  Some anxiety situational.   Concerta affordable. 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   Doing well without major issues.    Last visit changed to Metadate CD and that worked well but over $100.  Better energy and focus and more productive.  Tolerated well.  Ran out.  Dose seems good. Does ok without stimulant but better focus and energy and happier on it.  Be nice if insurance would cover it. Plan:Unfortunately she's been more depressed and has a tremor. Increase lamotrigine to 150 mg daily to help with depression BC tremor reduce Depakote to 750 mg HS.  Disc risk return of mania. Follow-up 8 weeks.  March 28, 2020 appointment with the following  noted Fine.  Hardly ever take Concerta bc it caused the tremor. Disc goal to get pregnant and need to get off Depakote. Rare panic.  Tolerates Ativan.  Ok off Concerta. Not depressed.  No manic behavior.  Patient reports stable mood and denies depressed or irritable moods.  Patient denies any recent difficulty with anxiety.  Patient denies difficulty with sleep initiation or maintenance. Denies appetite disturbance.  Patient reports that energy and motivation have been good.  Patient denies any difficulty with concentration.  Patient denies any suicidal ideation.  Past Psychiatric Medication Trials: Citalopram, venlafaxine, Wellbutrin, lamotrigine,  Latuda cost, Vraylar side effects, Abilify 5, lamotrigine 200, Ritalin, Rexulti  Review of Systems:  Review of Systems  Cardiovascular: Negative for palpitations.  Neurological: Negative for dizziness, tremors, weakness and headaches.  Psychiatric/Behavioral: Negative for agitation, behavioral problems, confusion, decreased concentration, dysphoric mood, hallucinations, self-injury, sleep disturbance and suicidal ideas. The patient is not nervous/anxious and is not hyperactive.     Medications: I have reviewed the patient's current medications.  Current Outpatient Medications  Medication Sig Dispense Refill  . levothyroxine (SYNTHROID) 25 MCG tablet TK 1 TAB BY MOUTH EVERY MORNING AT LEAST 30 60 MINS BEFORE BREAKFAST W/ A GLASS OF WATER    . LORazepam (ATIVAN) 0.5 MG tablet Take 1 tablet (0.5 mg total) by mouth every 8 (eight) hours as needed for anxiety. 30 tablet 0  . Norgestimate-Ethinyl Estradiol Triphasic (  TRI-LO-MARZIA) 0.18/0.215/0.25 MG-25 MCG tab Take 1 tablet by mouth daily. 84 tablet 3  . lamoTRIgine (LAMICTAL) 100 MG tablet Take 2 tablets (200 mg total) by mouth daily. 180 tablet 1   No current facility-administered medications for this visit.    Medication Side Effects: more tremor  Allergies: No Known Allergies  Past  Medical History:  Diagnosis Date  . Abnormal Pap smear of cervix   . Depression   . HPV in female     Family History  Problem Relation Age of Onset  . Thyroid disease Mother   . Diabetes Father   . Heart disease Father   . Diabetes Paternal Grandfather   . Heart disease Paternal Grandfather     Social History   Socioeconomic History  . Marital status: Divorced    Spouse name: Not on file  . Number of children: 1  . Years of education: Not on file  . Highest education level: Not on file  Occupational History  . Not on file  Tobacco Use  . Smoking status: Never Smoker  . Smokeless tobacco: Never Used  Vaping Use  . Vaping Use: Every day  Substance and Sexual Activity  . Alcohol use: Yes    Comment: rare  . Drug use: No  . Sexual activity: Yes    Birth control/protection: Pill    Comment: vasectomy  Other Topics Concern  . Not on file  Social History Narrative   49 year old daughter    Social Determinants of Health   Financial Resource Strain:   . Difficulty of Paying Living Expenses:   Food Insecurity:   . Worried About Programme researcher, broadcasting/film/video in the Last Year:   . Barista in the Last Year:   Transportation Needs:   . Freight forwarder (Medical):   Marland Kitchen Lack of Transportation (Non-Medical):   Physical Activity:   . Days of Exercise per Week:   . Minutes of Exercise per Session:   Stress:   . Feeling of Stress :   Social Connections:   . Frequency of Communication with Friends and Family:   . Frequency of Social Gatherings with Friends and Family:   . Attends Religious Services:   . Active Member of Clubs or Organizations:   . Attends Banker Meetings:   Marland Kitchen Marital Status:   Intimate Partner Violence:   . Fear of Current or Ex-Partner:   . Emotionally Abused:   Marland Kitchen Physically Abused:   . Sexually Abused:     Past Medical History, Surgical history, Social history, and Family history were reviewed and updated as appropriate.    Please see review of systems for further details on the patient's review from today.   Objective:   Physical Exam:  There were no vitals taken for this visit.  Physical Exam Constitutional:      General: She is not in acute distress. Musculoskeletal:        General: No deformity.  Neurological:     Mental Status: She is alert and oriented to person, place, and time.     Cranial Nerves: No dysarthria.     Coordination: Coordination normal.  Psychiatric:        Attention and Perception: Attention and perception normal. She does not perceive auditory or visual hallucinations.        Mood and Affect: Mood normal. Mood is not anxious or depressed. Affect is not labile, blunt, angry or inappropriate.  Speech: Speech normal.        Behavior: Behavior normal. Behavior is cooperative.        Thought Content: Thought content normal. Thought content is not paranoid or delusional. Thought content does not include homicidal or suicidal ideation. Thought content does not include homicidal or suicidal plan.        Cognition and Memory: Cognition and memory normal.        Judgment: Judgment normal.     Comments: Insight intact      Lab Review:     Component Value Date/Time   NA 139 12/26/2018 0145   NA 141 01/17/2017 1714   K 3.9 12/26/2018 0145   CL 106 12/26/2018 0145   CO2 20 (L) 12/26/2018 0145   GLUCOSE 141 (H) 12/26/2018 0145   BUN 11 12/26/2018 0145   BUN 10 01/17/2017 1714   CREATININE 0.71 12/26/2018 0145   CALCIUM 8.6 (L) 12/26/2018 0145   PROT 7.1 12/26/2018 0145   PROT 7.7 01/17/2017 1714   ALBUMIN 3.6 12/26/2018 0145   ALBUMIN 4.7 01/17/2017 1714   AST 19 12/26/2018 0145   ALT 20 12/26/2018 0145   ALKPHOS 65 12/26/2018 0145   BILITOT 0.3 12/26/2018 0145   BILITOT 0.3 01/17/2017 1714   GFRNONAA >60 12/26/2018 0145   GFRAA >60 12/26/2018 0145       Component Value Date/Time   WBC 12.4 (H) 12/26/2018 0145   RBC 4.63 12/26/2018 0145   HGB 12.6 12/26/2018  0145   HCT 38.1 12/26/2018 0145   PLT 310 12/26/2018 0145   MCV 82.3 12/26/2018 0145   MCH 27.2 12/26/2018 0145   MCHC 33.1 12/26/2018 0145   RDW 13.2 12/26/2018 0145    No results found for: POCLITH, LITHIUM   No results found for: PHENYTOIN, PHENOBARB, VALPROATE, CBMZ   .res Assessment: Plan:    Bipolar affective disorder, currently manic, moderate (HCC)  Panic disorder with agoraphobia  Attention deficit hyperactivity disorder (ADHD), combined type  Bipolar I disorder (HCC) - Plan: lamoTRIgine (LAMICTAL) 100 MG tablet    Overall mood stability is good at this time with a combination of Depakote 750 mg daily with lamotrigine 150 mg daily.  Anxiety is generally manageable.  She is doing okay with ADD despite bright not using a stimulant at this time.  Extensive discussion with great detail surrounding her goal to get pregnant in the short-term and having bipolar disorder.  Discussed the risks with and without medication.  Discussed the option and alternative mood stabilizers that have relatively lower risk in pregnancy and those that have higher risk such as Depakote.  Depakote needs to be stopped.  Lamotrigine is considered relatively safe for her.  Also discussed options like Latuda but cost was a barrier with that medication.  Discussed the fact that lamotrigine is less effective at preventing mania and some of the other mood stabilizers but should provide some benefit for that purpose.  She elected to continue lamotrigine and wean off Depakote.  We discussed the drug drug interactions between Depakote and lamotrigine will mean that her lamotrigine blood level will come down as she goes off Depakote.  We may have to increase the dose to compensate.  She will let us know if she starts seeing increased mood swings.  Wean Depakote for pregnancy over 10 days.  Disc risk of this causing birth defects etc.  Disc risk mood swings without it.  Call. First step would be to increase  lamotrigine.  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  Okay as needed lorazepam for rare panic attacks.  Disc fear flying in July coming up.  Asks for bz.  OK lorazepam.  We discussed the short-term risks associated with benzodiazepines including sedation and increased fall risk among others.  Discussed long-term side effect risk including dependence, potential withdrawal symptoms, and the potential eventual dose-related risk of dementia.  Will not drive at destination.  Ativan 0.5 mg 1-2 before trip if needed. Disc risk in pregnancy.  FU 3 mos  Meredith Staggersarey Cottle, MD, DFAPA  Please see After Visit Summary for patient specific instructions.  No future appointments.  No orders of the defined types were placed in this encounter.     -------------------------------

## 2020-04-03 ENCOUNTER — Other Ambulatory Visit: Payer: Self-pay | Admitting: Psychiatry

## 2020-04-03 DIAGNOSIS — F3112 Bipolar disorder, current episode manic without psychotic features, moderate: Secondary | ICD-10-CM

## 2020-04-09 ENCOUNTER — Telehealth: Payer: Self-pay | Admitting: Psychiatry

## 2020-04-09 NOTE — Telephone Encounter (Signed)
Pt was weaned off of her Depakote for 10 days and she took her last dose last night. Pt is experiencing crying, mood swings and major irritability. Please call.

## 2020-04-09 NOTE — Telephone Encounter (Signed)
Increase lamotrigine to 250 mg daily for 1 week, then 200 mg in AM and 100 mg in PM to try to compensate.  This may not be effective enough and we may have to use an alternative to Depakote.

## 2020-04-10 NOTE — Telephone Encounter (Signed)
Patient given instructions on dose increase of lamotrigine. She agrees and has plenty of medication to use. Will call back with and further questions or concerns. Very appreciative.

## 2020-04-25 ENCOUNTER — Encounter: Payer: Self-pay | Admitting: Certified Nurse Midwife

## 2020-04-25 ENCOUNTER — Ambulatory Visit (INDEPENDENT_AMBULATORY_CARE_PROVIDER_SITE_OTHER): Payer: BC Managed Care – PPO | Admitting: Certified Nurse Midwife

## 2020-04-25 ENCOUNTER — Telehealth: Payer: Self-pay | Admitting: Certified Nurse Midwife

## 2020-04-25 VITALS — BP 123/82 | HR 85 | Ht 65.0 in | Wt 234.4 lb

## 2020-04-25 DIAGNOSIS — Z319 Encounter for procreative management, unspecified: Secondary | ICD-10-CM | POA: Diagnosis not present

## 2020-04-25 DIAGNOSIS — E039 Hypothyroidism, unspecified: Secondary | ICD-10-CM

## 2020-04-25 DIAGNOSIS — R109 Unspecified abdominal pain: Secondary | ICD-10-CM

## 2020-04-25 DIAGNOSIS — R5383 Other fatigue: Secondary | ICD-10-CM

## 2020-04-25 DIAGNOSIS — R112 Nausea with vomiting, unspecified: Secondary | ICD-10-CM

## 2020-04-25 LAB — POCT URINE PREGNANCY: Preg Test, Ur: NEGATIVE

## 2020-04-25 MED ORDER — ASPIRIN EC 81 MG PO TBEC
81.0000 mg | DELAYED_RELEASE_TABLET | Freq: Every day | ORAL | 2 refills | Status: DC
Start: 2020-04-25 — End: 2021-12-01

## 2020-04-25 NOTE — Telephone Encounter (Signed)
Pt was checking in for a preg conf. The pt had a co pay, I told her that if she is conformed that this appt will be included with her [employer] matern[facility], but if not she will be billed the pt verbally understood at my window

## 2020-04-25 NOTE — Progress Notes (Signed)
Pt presents today with abdominal cramping that started 5 days ago. She also mentions that she has had nausea and vomited 2 times. She is requesting a pregnancy test today. She reports that she has not been on birth control since 04/05/20. Her LMP was on 04/09/20. She reports that it was lighter than usual.

## 2020-04-25 NOTE — Patient Instructions (Signed)
Mittelschmerz Mittelschmerz is a condition that affects women. It is pain in the abdomen that occurs between periods. The pain in this condition:  Affects one side of the abdomen. The side that it affects may change from month to month.  May be mild or severe.  May last from minutes to hours. It does not last longer than 1-2 days.  Can happen with nausea and light vaginal bleeding. Mittelschmerz is caused by the growth and release of an egg from an ovary, and it is a natural part of the ovulation cycle. It often happens about 2 weeks after a woman's period ends. Follow these instructions at home: Pay attention to any changes in your condition. Let your health care provider know about them. Take these actions to help with your pain:  Try soaking in a hot bath.  Try a heating pad to relax the muscles in the abdomen.  Take over-the-counter and prescription medicines only as told by your health care provider.  Keep all follow-up visits as told by your health care provider. This is important. Contact a health care provider if:  You have very bad pain most months.  You have abdominal pain that lasts longer than 24 hours.  Your pain medicine is not helping.  You have a fever.  You have nausea or vomiting that will not go away.  You miss your period.  You have vaginal bleeding between your periods that is heavier than spotting. Summary  Mittelschmerz is a condition that affects women. It is pain in the abdomen that occurs between periods.  Mittelschmerz is caused by the growth and release of an egg from an ovary, and it is a natural part of the ovulation cycle.  The pain in this condition may affect one side of the abdomen, may be mild or severe, or may cause nausea.  To relieve your pain, try soaking in a hot bath, using a heating pad, or taking prescription or over-the-counter medicines.  Contact a health care provider if you have pain most months of the year, your pain lasts  more than 24 hours, your pain medicine is not helping, or you have vaginal bleeding that is heavier than spotting. This information is not intended to replace advice given to you by your health care provider. Make sure you discuss any questions you have with your health care provider. Document Revised: 03/28/2018 Document Reviewed: 03/28/2018 Elsevier Patient Education  2020 [employer].   Pregnancy After Age 30 Women who become pregnant after the age of 11 have a higher risk for certain problems during pregnancy. This is because older women may already have health problems before becoming pregnant. Older women who are healthy before pregnancy may still develop problems during pregnancy. These problems may affect the mother, the unborn baby (fetus), or both. What are the risks for me? If you are over age 58 and you want to become pregnant or are pregnant, you may have a higher risk of:  Not being able to get pregnant (infertility).  Going into labor early (preterm labor).  Needing surgical delivery of your baby (cesarean delivery, or C-section).  Having high blood pressure (hypertension).  Having complications during pregnancy, such as high blood pressure and other symptoms (preeclampsia).  Having diabetes during pregnancy (gestational diabetes).  Being pregnant with more than one baby.  Loss of the unborn baby before 20 weeks (miscarriage) or after 20 weeks of pregnancy (stillbirth). What are the risks for my baby? Babies born to women over the age of  35 have a higher risk for:  Being born early (prematurity).  Low birth weight, which is less than 5 lb, 8 oz (2.5 kg).  Birth defects, such as Down syndrome and cleft palate.  Health complications, including problems with growth and development. How is prenatal care different for women over age 105? All women should see their health care provider before they try to become pregnant. This is especially important for women over the  age of 29. Tell your health care provider about:  Any health problems you have.  Any medicines you take.  Any family history of health problems or chromosome-related defects.  Any problems you have had with past pregnancies or deliveries. If you are over age 50 and you plan to become pregnant:  Start taking a daily multivitamin a month or more before you try to get pregnant. Your multivitamin should contain 400 mcg (micrograms) of folic acid. If you are over age 37 and pregnant, make sure you:  Keep taking your multivitamin unless your health care provider tells you not to take it.  Keep all prenatal visits as told by your health care provider. This is important.  Have ultrasounds regularly throughout your pregnancy to check for problems.  Talk with your health care provider about other prenatal screening tests that you may need. What additional prenatal tests are needed? Screening tests show whether your baby has a higher risk for birth defects than other babies. Screening tests include:  Ultrasound tests to look for markers that indicate a risk for birth defects.  Maternal blood screening. These are blood tests that measure certain substances in your blood to determine your baby's risk for defects. Screening tests do not show whether your baby has or does not have defects. They only show your baby's risk for certain defects. If your screening tests show that risk factors are present, you may need tests to confirm the defect (diagnostic testing). These tests may include:  Chorionic villus sampling. For this procedure, a tissue sample is taken from the organ that forms in your uterus to nourish your baby (placenta). The sample is removed through your cervix or abdomen and tested.  Amniocentesis. For this procedure, a small amount of the fluid that surrounds the baby in the uterus (amniotic fluid) is removed and tested. What can I do to stay healthy during my pregnancy? Staying  healthy during pregnancy can help you and your baby to have a lower risk for problems during pregnancy, during delivery, or both. Talk with your health care provider for specific instructions about staying healthy during your pregnancy. Nutrition   At each meal, eat a variety of foods from each of the five food groups. These groups include: ? Proteins such as lean meats, poultry, fish that is low in fat, beans, eggs, and nuts. ? Vegetables such as leafy greens, raw and cooked vegetables, and vegetable juice. ? Fruits that are fresh, frozen, or canned, or 100% fruit juice. ? Dairy products such as low-fat yogurt, cheese, and milk. ? Whole grains including rice, cereal, pasta, and bread.  Talk with your health care provider about how much food in each group is right for you.  Follow instructions from your health care provider about eating and drinking restrictions during pregnancy. ? Do not eat raw eggs, raw meat, or raw fish or seafood. ? Do not eat any fish that contains high amounts of mercury, such as swordfish or mackerel.  Drink 6-8 or more glasses of water a day. You should drink  enough fluid to keep your urine pale yellow. Managing weight gain  Ask your health care provider how much weight gain is healthy during pregnancy.  Stay at a healthy weight. If needed, work with your health care provider to lose weight safely. Activity  Exercise regularly, as directed by your health care provider. Ask your health care provider what forms of exercise are safe for you. General instructions  Do not use any products that contain nicotine or tobacco, such as cigarettes and e-cigarettes. If you need help quitting, ask your health care provider.  Do not drink alcohol, use drugs, or abuse prescription medicine.  Take over-the-counter and prescription medicines only as told by your health care provider.  Do not use hot tubs, steam rooms, or saunas.  Talk with your health care provider about  your risk of exposure to harmful environmental conditions. This includes exposure to chemicals, radiation, cleaning products, and cat feces. Follow advice from your health care provider about how to limit your exposure. Summary  Women who become pregnant after the age of 17 have a higher risk for complications during pregnancy.  Problems may affect the mother, the unborn baby (fetus), or both.  All women should see their health care provider before they try to become pregnant. This is especially important for women over the age of 3.  Staying healthy during pregnancy can help both you and your baby to have a lower risk for some of the problems that can happen during pregnancy, during delivery, or both. This information is not intended to replace advice given to you by your health care provider. Make sure you discuss any questions you have with your health care provider. Document Revised: 01/05/2019 Document Reviewed: 01/03/2017 Elsevier Patient Education  2020 [employer].    Preparing for Pregnancy If you are considering becoming pregnant, make an appointment to see your regular health care provider to learn how to prepare for a safe and healthy pregnancy (preconception care). During a preconception care visit, your health care provider will:  Do a complete physical exam, including a Pap test.  Take a complete medical history.  Give you information, answer your questions, and help you resolve problems. Preconception checklist Medical history  Tell your health care provider about any current or past medical conditions. Your pregnancy or your ability to become pregnant may be affected by chronic conditions, such as diabetes, chronic hypertension, and thyroid problems.  Include your family's medical history as well as your partner's medical history.  Tell your health care provider about any history of STIs (sexually transmitted infections).These can affect your pregnancy. In some  cases, they can be passed to your baby. Discuss any concerns that you have about STIs.  If indicated, discuss the benefits of genetic testing. This testing will show whether there are any genetic conditions that may be passed from you or your partner to your baby.  Tell your health care provider about: ? Any problems you have had with conception or pregnancy. ? Any medicines you take. These include vitamins, herbal supplements, and over-the-counter medicines. ? Your history of immunizations. Discuss any vaccinations that you may need. Diet  Ask your health care provider what to include in a healthy diet that has a balance of nutrients. This is especially important when you are pregnant or preparing to become pregnant.  Ask your health care provider to help you reach a healthy weight before pregnancy. ? If you are overweight, you may be at higher risk for certain complications, such as  high blood pressure, diabetes, and preterm birth. ? If you are underweight, you are more likely to have a baby who has a low birth weight. Lifestyle, work, and home  Let your health care provider know: ? About any lifestyle habits that you have, such as alcohol use, drug use, or smoking. ? About recreational activities that may put you at risk during pregnancy, such as downhill skiing and certain exercise programs. ? Tell your health care provider about any international travel, especially any travel to places with an active [place] virus outbreak. ? About harmful substances that you may be exposed to at work or at home. These include chemicals, pesticides, radiation, or even litter boxes. ? If you do not feel safe at home. Mental health  Tell your health care provider about: ? Any history of mental health conditions, including feelings of depression, sadness, or anxiety. ? Any medicines that you take for a mental health condition. These include herbs and supplements. Home instructions to prepare for  pregnancy Lifestyle   Eat a balanced diet. This includes fresh fruits and vegetables, whole grains, lean meats, low-fat dairy products, healthy fats, and foods that are high in fiber. Ask to meet with a nutritionist or registered dietitian for assistance with meal planning and goals.  Get regular exercise. Try to be active for at least 30 minutes a day on most days of the week. Ask your health care provider which activities are safe during pregnancy.  Do not use any products that contain nicotine or tobacco, such as cigarettes and e-cigarettes. If you need help quitting, ask your health care provider.  Do not drink alcohol.  Do not take illegal drugs.  Maintain a healthy weight. Ask your health care provider what weight range is right for you. General instructions  Keep an accurate record of your menstrual periods. This makes it easier for your health care provider to determine your baby's due date.  Begin taking prenatal vitamins and folic acid supplements daily as directed by your health care provider.  Manage any chronic conditions, such as high blood pressure and diabetes, as told by your health care provider. This is important. How do I know that I am pregnant? You may be pregnant if you have been sexually active and you miss your period. Symptoms of early pregnancy include:  Mild cramping.  Very light vaginal bleeding (spotting).  Feeling unusually tired.  Nausea and vomiting (morning sickness). If you have any of these symptoms and you suspect that you might be pregnant, you can take a home pregnancy test. These tests check for a hormone in your urine (human chorionic gonadotropin, or hCG). A woman's body begins to make this hormone during early pregnancy. These tests are very accurate. Wait until at least the first day after you miss your period to take one. If the test shows that you are pregnant (you get a positive result), call your health care provider to make an  appointment for prenatal care. What should I do if I become pregnant?      Make an appointment with your health care provider as soon as you suspect you are pregnant.  Do not use any products that contain nicotine, such as cigarettes, chewing tobacco, and e-cigarettes. If you need help quitting, ask your health care provider.  Do not drink alcoholic beverages. Alcohol is related to a number of birth defects.  Avoid toxic odors and chemicals.  You may continue to have sexual intercourse if it does not cause pain or  other problems, such as vaginal bleeding. This information is not intended to replace advice given to you by your health care provider. Make sure you discuss any questions you have with your health care provider. Document Revised: 09/15/2017 Document Reviewed: 04/04/2016 Elsevier Patient Education  2020 [employer].

## 2020-04-26 LAB — BETA HCG QUANT (REF LAB): hCG Quant: <1 m[IU]/mL

## 2020-04-26 LAB — THYROID PANEL WITH TSH
Free Thyroxine Index: 1.4 (ref 1.2–4.9)
T3 Uptake Ratio: 21 % — ABNORMAL LOW (ref 24–39)
T4, Total: 6.7 ug/dL (ref 4.5–12.0)
TSH: 2.63 u[IU]/mL (ref 0.450–4.500)

## 2020-04-26 LAB — VITAMIN D 25 HYDROXY (VIT D DEFICIENCY, FRACTURES): Vit D, 25-Hydroxy: 20.6 ng/mL — ABNORMAL LOW (ref 30.0–100.0)

## 2020-04-27 ENCOUNTER — Other Ambulatory Visit: Payer: Self-pay | Admitting: Certified Nurse Midwife

## 2020-04-27 ENCOUNTER — Encounter: Payer: Self-pay | Admitting: Certified Nurse Midwife

## 2020-04-27 DIAGNOSIS — E559 Vitamin D deficiency, unspecified: Secondary | ICD-10-CM | POA: Insufficient documentation

## 2020-04-27 MED ORDER — VITAMIN D (ERGOCALCIFEROL) 1.25 MG (50000 UNIT) PO CAPS: 50000.0000 [IU] | ORAL_CAPSULE | ORAL | 1 refills | Status: DC

## 2020-04-27 NOTE — Progress Notes (Signed)
GYN ENCOUNTER NOTE  Subjective:       Lisa Schroeder is a 44 y.o. [id number] female is here for gynecologic evaluation of the following issues:  1. Abdominal cramping for the last five (5) days 2. Nausea and vomiting x two (2) episodes 3. Menstrual changes   Trying to conceive for less than one month. Denies difficulty breathing or respiratory distress, chest pain, dysuria, excessive vaginal bleeding, and leg pain or swelling.    Gynecologic History  Patient's last menstrual period was 04/09/2020.  Contraception: none  Last Pap: 12/2019. Results were: abnormal; colposcopy 02/2020, repeat pap in one (1) year  Last mammogram: 10/2019. Results were: BI-RADS 1  Obstetric History  OB History  Gravida Para Term Preterm AB Living  1 1 1     1   SAB TAB Ectopic Multiple Live Births          1    # Outcome Date GA Lbr Len/2nd Weight Sex Delivery Anes PTL Lv  1 Term 12/23/05   7 lb 2 oz (3.232 kg) F Vag-Spont  N LIV    Past Medical History:  Diagnosis Date  . Abnormal Pap smear of cervix   . Depression   . HPV in female     Past Surgical History:  Procedure Laterality Date  . COLPOSCOPY     for many yrs per pt  . NECK SURGERY Left    lumb notes     Current Outpatient Medications on File Prior to Visit  Medication Sig Dispense Refill  . lamoTRIgine (LAMICTAL) 100 MG tablet Take 2 tablets (200 mg total) by mouth daily. 180 tablet 1  . levothyroxine (SYNTHROID) 25 MCG tablet TK 1 TAB BY MOUTH EVERY MORNING AT LEAST 30 60 MINS BEFORE BREAKFAST W/ A GLASS OF WATER    . LORazepam (ATIVAN) 0.5 MG tablet Take 1 tablet (0.5 mg total) by mouth every 8 (eight) hours as needed for anxiety. 30 tablet 0  . Norgestimate-Ethinyl Estradiol Triphasic (TRI-LO-MARZIA) 0.18/0.215/0.25 MG-25 MCG tab Take 1 tablet by mouth daily. (Patient not taking: Reported on 04/25/2020) 84 tablet 3   No current facility-administered medications on file prior to visit.    No Known Allergies  Social History    Socioeconomic History  . Marital status: Divorced    Spouse name: Not on file  . Number of children: 1  . Years of education: Not on file  . Highest education level: Not on file  Occupational History  . Not on file  Tobacco Use  . Smoking status: Never Smoker  . Smokeless tobacco: Never Used  Vaping Use  . Vaping Use: Every day  Substance and Sexual Activity  . Alcohol use: Yes    Comment: rare  . Drug use: No  . Sexual activity: Yes    Birth control/protection: Pill    Comment: vasectomy  Other Topics Concern  . Not on file  Social History Narrative   43 year old daughter    Social Determinants of Health   Financial Resource Strain:   . Difficulty of Paying Living Expenses:   Food Insecurity:   . Worried About Programme researcher, broadcasting/film/videounning Out of Food in the Last Year:   . Baristaan Out of Food in the Last Year:   Transportation Needs:   . Freight forwarderLack of Transportation (Medical):   Marland Kitchen. Lack of Transportation (Non-Medical):   Physical Activity:   . Days of Exercise per Week:   . Minutes of Exercise per Session:   Stress:   . Feeling  of Stress :   Social Connections:   . Frequency of Communication with Friends and Family:   . Frequency of Social Gatherings with Friends and Family:   . Attends Religious Services:   . Active Member of Clubs or Organizations:   . Attends Banker Meetings:   Marland Kitchen Marital Status:   Intimate Partner Violence:   . Fear of Current or Ex-Partner:   . Emotionally Abused:   Marland Kitchen Physically Abused:   . Sexually Abused:     Family History  Problem Relation Age of Onset  . Thyroid disease Mother   . Diabetes Father   . Heart disease Father   . Diabetes Paternal Grandfather   . Heart disease Paternal Grandfather     The following portions of the patient's history were reviewed and updated as appropriate: allergies, current medications, past family history, past medical history, past social history, past surgical history and problem list.  Review of  Systems  ROS negative except as noted above. Information obtained from patient.   Objective:   BP 123/82   Pulse 85   Ht 5\' 5"  (1.651 m)   Wt (!) 234 lb 6.4 oz (106.3 kg)   LMP 04/09/2020   BMI 39.01 kg/m    CONSTITUTIONAL: Well-developed, well-nourished female in no acute distress.   PHYSICAL EXAM: Not indicated.   Recent Results (from the past 2160 hour(s))  Beta hCG quant (ref lab)     Status: None   Collection Time: 04/25/20  9:35 AM  Result Value Ref Range   hCG Quant <1 mIU/mL    Comment:                      Female (Non-pregnant)    0 -     5                             (Postmenopausal)  0 -     8                      Female (Pregnant)                      Weeks of Gestation                              3                6 -    71                              4               10 -   750                              5              217 -  7138                              6              158 - [phone]  7             [id number] [phone]                              8            [id number] -[phone]                              9            [id number] -[phone]                             10            [id number] [phone]                             12            [id number] -[phone]                             14            13950 - 62530                             15            12039 - 70971                             16             9040 - 56451                             17             [phone] - [phone] Roche E CLIA methodology   Thyroid Panel With TSH     Status: Abnormal   Collection Time: 04/25/20  9:35 AM  Result Value Ref Range   TSH 2.630 0.450 - 4.500 uIU/mL   T4, Total 6.7 4.5 - 12.0 ug/dL   T3 Uptake Ratio 21 (L) 24 - 39 %   Free Thyroxine Index 1.4 1.2 - 4.9  VITAMIN D 25 Hydroxy (Vit-D Deficiency, Fractures)     Status: Abnormal   Collection Time: 04/25/20  9:35 AM  Result Value Ref Range   Vit D,  25-Hydroxy 20.6 (L) 30.0 - 100.0 ng/mL    Comment: Vitamin D deficiency has been defined by the Institute of Medicine and an Endocrine Society practice guideline as a level of serum 25-OH vitamin D less than 20 ng/mL (1,2). The Endocrine Society went on to further define vitamin D insufficiency as a level between 21 and 29 ng/mL (2). 1. IOM (Institute of Medicine). 2010. Dietary reference    intakes for calcium and D. [place]: The    [employer]. 2. Holick MF, Binkley [place], Bischoff-Ferrari HA, et al.    Evaluation,  treatment, and prevention of vitamin D    deficiency: an Endocrine Society clinical practice    guideline. JCEM. 2011 Jul; 96(7):1911-30.   POCT urine pregnancy     Status: None   Collection Time: 04/25/20  9:54 AM  Result Value Ref Range   Preg Test, Ur Negative Negative    Assessment:   1. Patient desires pregnancy  - Beta hCG quant (ref lab) - POCT urine pregnancy  2. Abdominal cramping  - Beta hCG quant (ref lab)  3. Fatigue, unspecified type  - Thyroid Panel With TSH - VITAMIN D 25 Hydroxy (Vit-D Deficiency, Fractures)  4. Hypothyroidism, unspecified type  - Thyroid Panel With TSH - VITAMIN D 25 Hydroxy (Vit-D Deficiency, Fractures)  5. Nausea and vomiting, intractability of vomiting not specified, unspecified vomiting type   Plan:   Labs today, see orders.   Education regarding Preparing for Pregnancy, see AVS.   Reviewed red flag symptoms and when to call.   RTC as previously scheduled or sooner if needed.    Serafina Royals, CNM [facility]

## 2020-04-27 NOTE — Progress Notes (Signed)
Rx Vitamin D, see orders.    Serafina Royals, CNM [facility], [facility] 04/27/20 3:05 AM

## 2020-06-03 ENCOUNTER — Telehealth: Payer: Self-pay | Admitting: Psychiatry

## 2020-06-03 NOTE — Telephone Encounter (Signed)
She went off Depakote and increased her Lamictal. She is not doing well, severe mood swings and major depression right now. Asked to move appt up but we don't have anything. Next appt 07/02/20. Asks for a RF of Concerta too. Helps her feel better, only takes it periodically. Call between 8am -4pm at work# [phone]. Ask for her.

## 2020-06-03 NOTE — Telephone Encounter (Signed)
Please review..   Also there isn't a Rx for Concerta on her profile

## 2020-06-04 NOTE — Telephone Encounter (Signed)
Lisa Schroeder please let her know he wants to see her before he prescribes anything and front office should be calling her

## 2020-06-04 NOTE — Telephone Encounter (Signed)
Left patient a VM to return my call 

## 2020-06-04 NOTE — Telephone Encounter (Signed)
Pt was called and LM to offer opening on Monday Sept 13. She needs to call back to confirm it.

## 2020-06-04 NOTE — Telephone Encounter (Signed)
What spot are you talking about?

## 2020-06-04 NOTE — Telephone Encounter (Signed)
Cancellatiion list

## 2020-06-04 NOTE — Telephone Encounter (Signed)
There's now an open 1030 spot on next monday

## 2020-06-05 ENCOUNTER — Telehealth: Payer: Self-pay | Admitting: Psychiatry

## 2020-06-05 NOTE — Telephone Encounter (Signed)
Lisa Schroeder called because we had tried to get her in for appt Monday, 9/13 but she can't do that.  She will need to just keep her appt 10/6.  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  She knows the nurse has been trying to reach her, but it has been when she is at work and she can't answer her phone.  She is off today so if someone can call her today she can answer her phone.

## 2020-06-05 NOTE — Telephone Encounter (Signed)
She wanted to stop Depakote bc of a desire to pursue pregnancy as I recall.  If she wants to restart Depakote until the appt and postpone pursuit of pregnancy untril we can start an alternative to Depakote, she could do that.  I don't want to start a new med until the appt and we discuss options.

## 2020-06-05 NOTE — Telephone Encounter (Signed)
Per previous phone call Dr. Jennelle Human didn't want to make changes until she was seen. I will forward him this message she can't come till October.

## 2020-06-06 ENCOUNTER — Telehealth: Payer: Self-pay | Admitting: Psychiatry

## 2020-06-06 MED ORDER — LURASIDONE HCL 40 MG PO TABS
40.0000 mg | ORAL_TABLET | Freq: Every day | ORAL | 0 refills | Status: DC
Start: 2020-06-06 — End: 2020-07-02

## 2020-06-06 NOTE — Telephone Encounter (Signed)
Noted thank you

## 2020-06-06 NOTE — Telephone Encounter (Signed)
TC  Pt with rapid mood swings off Depakote.  Might be pregnant.  Needs another stabilizer.  Took Latuda before but stopped DT cost.  Agrees to restart bc it's one of safest in pregnancy.  Will send in RX for 40 mg Latuda.  Meredith Staggers, MD, DFAPA

## 2020-06-09 ENCOUNTER — Ambulatory Visit: Payer: BC Managed Care – PPO | Admitting: Psychiatry

## 2020-07-02 ENCOUNTER — Other Ambulatory Visit: Payer: Self-pay

## 2020-07-02 ENCOUNTER — Encounter: Payer: Self-pay | Admitting: Psychiatry

## 2020-07-02 ENCOUNTER — Ambulatory Visit (INDEPENDENT_AMBULATORY_CARE_PROVIDER_SITE_OTHER): Payer: BC Managed Care – PPO | Admitting: Psychiatry

## 2020-07-02 DIAGNOSIS — F4001 Agoraphobia with panic disorder: Secondary | ICD-10-CM

## 2020-07-02 DIAGNOSIS — F319 Bipolar disorder, unspecified: Secondary | ICD-10-CM | POA: Diagnosis not present

## 2020-07-02 DIAGNOSIS — F902 Attention-deficit hyperactivity disorder, combined type: Secondary | ICD-10-CM

## 2020-07-02 MED ORDER — LURASIDONE HCL 80 MG PO TABS: 80.0000 mg | ORAL_TABLET | Freq: Every day | ORAL | 1 refills | Status: DC

## 2020-07-03 ENCOUNTER — Other Ambulatory Visit: Payer: Self-pay | Admitting: Psychiatry

## 2020-08-08 NOTE — Progress Notes (Signed)
Lisa Schroeder [id number] 1976-05-30 44 y.o.  Subjective:   Patient ID:  Lisa Schroeder is a 44 y.o. (DOB October 27, 1975) female.  Chief Complaint:  Chief Complaint  Patient presents with  . Follow-up  . Manic Behavior    HPI Lisa Schroeder presents to the office today for follow-up of bipolar disorder and anxiety disorders.  visit June 2020.  She could not afford Metadate so she was given a prescription for Concerta 36 mg daily.  No other med changes.  She remained on Abilify 5 mg daily, lamotrigine 200 mg daily.  She called May 22, 2019 stating that she was having panic attacks and asked for prescription for Ativan.  It was okayed for her to use sparingly.  seen October 05, 2019.  She described a pattern of hypersexuality as well as some other manic symptoms that she and her therapist had discussed.  We decided to switch from Abilify to Depakote as a mood stabilizer.  11/16/2019 appointment with the following noted: Did switch sx.  Episodes mania are gone but more depression.  Couple weeks ago didn't want to leave the house.  Abilify did better with the depression. No manic sx in the last 3 weeks.  No further hypersexuality resolved.  Sleeping well.  Some racing thoughts but better.  Not irritable..  Some anxiety situational.   Concerta affordable. 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   Doing well without major issues.    Last visit changed to Metadate CD and that worked well but over $100.  Better energy and focus and more productive.  Tolerated well.  Ran out.  Dose seems good. Does ok without stimulant but better focus and energy and happier on it.  Be nice if insurance would cover it. Plan:Unfortunately she's been more depressed and has a tremor. Increase lamotrigine to 150 mg daily to help with depression BC tremor reduce Depakote to 750 mg HS.  Disc risk return of mania. Follow-up 8 weeks.  March 28, 2020 appointment with the following  noted Fine.  Hardly ever take Concerta bc it caused the tremor. Disc goal to get pregnant and need to get off Depakote. Rare panic.  Tolerates Ativan.  Ok off Concerta. Not depressed.  No manic behavior. Plan: Wean Depakote for pregnancy over 10 days.  Disc risk of this causing birth defects etc.  Disc risk mood swings without it.  Call. First step would be to increase lamotrigine.  TC 06/06/20 Pt with rapid mood swings off Depakote. Might be pregnant. Needs another stabilizer. Took Latuda before but stopped DT cost. Agrees to restart bc it's one of safest in pregnancy. Will send in RX for 40 mg Latuda.  07/02/2020 appointment with the following noted: Not pregnant. "I feel 10 times better" in a few days.  Happier and more productive.  No crying anymore. No significant anger like before.  Sleep is good. Last Friday was dizzy and told she could be having SE.  He told her to cut Latuda to 20 and then increase it back to 40 mg.  Back on 40 mg for 4-5 days.    Patient reports stable mood and denies depressed or irritable moods.  Patient denies any recent difficulty with anxiety.  Patient denies difficulty with sleep initiation or maintenance. Denies appetite disturbance.  Patient reports that energy and motivation have been good.  Patient denies any difficulty with concentration.  Patient denies any suicidal ideation.  1 D 44yo.  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  Past Psychiatric Medication Trials: Citalopram, venlafaxine, Wellbutrin, lamotrigine,  Latuda cost, Vraylar side effects, Abilify 5, lamotrigine 200, Ritalin, Rexulti   Review of Systems:  Review of Systems  Neurological: Negative for tremors and weakness.    Medications: I have reviewed the patient's current medications.  Current Outpatient Medications  Medication Sig Dispense Refill  . aspirin EC 81 MG tablet Take 1 tablet (81 mg total) by mouth daily. 300 tablet 2  . lamoTRIgine (LAMICTAL) 100 MG tablet Take 2  tablets (200 mg total) by mouth daily. 180 tablet 1  . levothyroxine (SYNTHROID) 25 MCG tablet TK 1 TAB BY MOUTH EVERY MORNING AT LEAST 30 60 MINS BEFORE BREAKFAST W/ A GLASS OF WATER    . LORazepam (ATIVAN) 0.5 MG tablet Take 1 tablet (0.5 mg total) by mouth every 8 (eight) hours as needed for anxiety. 30 tablet 0  . lurasidone (LATUDA) 80 MG TABS tablet Take 1 tablet (80 mg total) by mouth daily with breakfast. 30 tablet 1  . Norgestimate-Ethinyl Estradiol Triphasic (TRI-LO-MARZIA) 0.18/0.215/0.25 MG-25 MCG tab Take 1 tablet by mouth daily. 84 tablet 3  . Vitamin D, Ergocalciferol, (DRISDOL) 1.25 MG (50000 UNIT) CAPS capsule Take 1 capsule (50,000 Units total) by mouth every 7 (seven) days. 14 capsule 1   No current facility-administered medications for this visit.    Medication Side Effects as noted  Allergies: No Known Allergies  Past Medical History:  Diagnosis Date  . Abnormal Pap smear of cervix   . Depression   . HPV in female     Family History  Problem Relation Age of Onset  . Thyroid disease Mother   . Diabetes Father   . Heart disease Father   . Diabetes Paternal Grandfather   . Heart disease Paternal Grandfather     Social History   Socioeconomic History  . Marital status: Divorced    Spouse name: Not on file  . Number of children: 1  . Years of education: Not on file  . Highest education level: Not on file  Occupational History  . Not on file  Tobacco Use  . Smoking status: Never Smoker  . Smokeless tobacco: Never Used  Vaping Use  . Vaping Use: Every day  Substance and Sexual Activity  . Alcohol use: Yes    Comment: rare  . Drug use: No  . Sexual activity: Yes    Birth control/protection: Pill    Comment: vasectomy  Other Topics Concern  . Not on file  Social History Narrative   32 year old daughter    Social Determinants of Health   Financial Resource Strain:   . Difficulty of Paying Living Expenses: Not on file  Food Insecurity:   .  Worried About Programme researcher, broadcasting/film/video in the Last Year: Not on file  . Ran Out of Food in the Last Year: Not on file  Transportation Needs:   . Lack of Transportation (Medical): Not on file  . Lack of Transportation (Non-Medical): Not on file  Physical Activity:   . Days of Exercise per Week: Not on file  . Minutes of Exercise per Session: Not on file  Stress:   . Feeling of Stress : Not on file  Social Connections:   . Frequency of Communication with Friends and Family: Not on file  . Frequency of Social Gatherings with Friends and Family: Not on file  . Attends Religious Services: Not on file  . Active Member of Clubs or Organizations:  Not on file  . Attends BankerClub or Organization Meetings: Not on file  . Marital Status: Not on file  Intimate Partner Violence:   . Fear of Current or Ex-Partner: Not on file  . Emotionally Abused: Not on file  . Physically Abused: Not on file  . Sexually Abused: Not on file    Past Medical History, Surgical history, Social history, and Family history were reviewed and updated as appropriate.   Please see review of systems for further details on the patient's review from today.   Objective:   Physical Exam:  There were no vitals taken for this visit.  Physical Exam Constitutional:      General: She is not in acute distress. Musculoskeletal:        General: No deformity.  Neurological:     Mental Status: She is alert and oriented to person, place, and time.     Coordination: Coordination normal.  Psychiatric:        Attention and Perception: Attention and perception normal. She does not perceive auditory or visual hallucinations.        Mood and Affect: Mood is anxious and depressed. Affect is not labile, blunt, angry or inappropriate.        Speech: Speech normal.        Behavior: Behavior normal.        Thought Content: Thought content normal. Thought content is not paranoid or delusional. Thought content does not include homicidal or suicidal  ideation. Thought content does not include homicidal or suicidal plan.        Cognition and Memory: Cognition and memory normal.        Judgment: Judgment normal.     Comments: Insight intact     Lab Review:     Component Value Date/Time   NA 139 12/26/2018 0145   NA 141 01/17/2017 1714   K 3.9 12/26/2018 0145   CL 106 12/26/2018 0145   CO2 20 (L) 12/26/2018 0145   GLUCOSE 141 (H) 12/26/2018 0145   BUN 11 12/26/2018 0145   BUN 10 01/17/2017 1714   CREATININE 0.71 12/26/2018 0145   CALCIUM 8.6 (L) 12/26/2018 0145   PROT 7.1 12/26/2018 0145   PROT 7.7 01/17/2017 1714   ALBUMIN 3.6 12/26/2018 0145   ALBUMIN 4.7 01/17/2017 1714   AST 19 12/26/2018 0145   ALT 20 12/26/2018 0145   ALKPHOS 65 12/26/2018 0145   BILITOT 0.3 12/26/2018 0145   BILITOT 0.3 01/17/2017 1714   GFRNONAA >60 12/26/2018 0145   GFRAA >60 12/26/2018 0145       Component Value Date/Time   WBC 12.4 (H) 12/26/2018 0145   RBC 4.63 12/26/2018 0145   HGB 12.6 12/26/2018 0145   HCT 38.1 12/26/2018 0145   PLT 310 12/26/2018 0145   MCV 82.3 12/26/2018 0145   MCH 27.2 12/26/2018 0145   MCHC 33.1 12/26/2018 0145   RDW 13.2 12/26/2018 0145    No results found for: POCLITH, LITHIUM   No results found for: PHENYTOIN, PHENOBARB, VALPROATE, CBMZ   .res Assessment: Plan:    Lisa Schroeder was seen today for follow-up and manic behavior.  Diagnoses and all orders for this visit:  Bipolar I disorder (HCC) -     lurasidone (LATUDA) 80 MG TABS tablet; Take 1 tablet (80 mg total) by mouth daily with breakfast.  Panic disorder with agoraphobia  Attention deficit hyperactivity disorder (ADHD), combined type    Overall mood stability is good at this time with a combination of  Depakote 750 mg daily with lamotrigine 150 mg daily.  Anxiety is generally manageable.  She is doing okay with ADD despite bright not using a stimulant at this time.  Extensive discussion with great detail surrounding her goal to get pregnant in  the short-term and having bipolar disorder.  Discussed the risks with and without medication.  Discussed the option and alternative mood stabilizers that have relatively lower risk in pregnancy and those that have higher risk such as Depakote.  Depakote stopped.  Lamotrigine is considered relatively safe for her.  Also discussed options like Latuda but cost was a barrier with that medication.  Discussed the fact that lamotrigine is less effective at preventing mania and some of the other mood stabilizers but should provide some benefit for that purpose.  She elected to continue lamotrigine and wean off Depakote. We may have to increase the dose to compensate.  She will let us know if she starts seeing increased mood swings.  Off Depakote and on Latuda bc pursuing pregnancy and cannot have stability without medication.  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  Okay as needed lorazepam for rare panic attacks.  OK lorazepam rarely and not recommended when pregnant.  We discussed the short-term risks associated with benzodiazepines including sedation and increased fall risk among others.  Discussed long-term side effect risk including dependence, potential withdrawal symptoms, and the potential eventual dose-related risk of dementia.  Will not drive at destination.  Ativan 0.5 mg 1-2 before trip if needed. Disc risk in pregnancy.  FU 3 mos  Meredith Staggers, MD, DFAPA Please see After Visit Summary for patient specific instructions.  Future Appointments  Date Time Provider Department Center  10/01/2020  4:30 PM Cottle, Steva Ready., MD [facility] None    No orders of the defined types were placed in this encounter.   -------------------------------

## 2020-09-03 ENCOUNTER — Other Ambulatory Visit: Payer: Self-pay | Admitting: Psychiatry

## 2020-09-03 DIAGNOSIS — F319 Bipolar disorder, unspecified: Secondary | ICD-10-CM

## 2020-10-01 ENCOUNTER — Encounter: Payer: Self-pay | Admitting: Psychiatry

## 2020-10-01 ENCOUNTER — Telehealth (INDEPENDENT_AMBULATORY_CARE_PROVIDER_SITE_OTHER): Payer: BC Managed Care – PPO | Admitting: Psychiatry

## 2020-10-01 DIAGNOSIS — F902 Attention-deficit hyperactivity disorder, combined type: Secondary | ICD-10-CM | POA: Diagnosis not present

## 2020-10-01 DIAGNOSIS — F4001 Agoraphobia with panic disorder: Secondary | ICD-10-CM

## 2020-10-01 DIAGNOSIS — F319 Bipolar disorder, unspecified: Secondary | ICD-10-CM

## 2020-10-01 MED ORDER — LURASIDONE HCL 80 MG PO TABS: 80.0000 mg | ORAL_TABLET | Freq: Every day | ORAL | 5 refills | Status: DC

## 2020-10-01 MED ORDER — LAMOTRIGINE 100 MG PO TABS: 200.0000 mg | ORAL_TABLET | Freq: Every day | ORAL | 1 refills | Status: DC

## 2020-10-01 MED ORDER — LORAZEPAM 1 MG PO TABS: 1.0000 mg | ORAL_TABLET | Freq: Three times a day (TID) | ORAL | 0 refills | Status: DC | PRN

## 2020-10-01 NOTE — Progress Notes (Signed)
Lisa Schroeder [id number] Nov 18, 1975 45 y.o.   Video Visit via My Chart  I connected with pt by My Chart and verified that I am speaking with the correct person using two identifiers.   I discussed the limitations, risks, security and privacy concerns of performing an evaluation and management service by My Chart  and the availability of in person appointments. I also discussed with the patient that there may be a patient responsible charge related to this service. The patient expressed understanding and agreed to proceed.  I discussed the assessment and treatment plan with the patient. The patient was provided an opportunity to ask questions and all were answered. The patient agreed with the plan and demonstrated an understanding of the instructions.   The patient was advised to call back or seek an in-person evaluation if the symptoms worsen or if the condition fails to improve as anticipated.  I provided 30 minutes of video time during this encounter.  The patient was located at home and the provider was located office. Session started 450 and ended at 5:20 PM  Subjective:   Patient ID:  Lisa Schroeder is a 45 y.o. (DOB 1975/11/02) female.  Chief Complaint:  Chief Complaint  Patient presents with  . Follow-up  . Depression  . Anxiety  . ADHD    HPI Vicie Cech presents to the office today for follow-up of bipolar disorder and anxiety disorders.  visit June 2020.  She could not afford Metadate so she was given a prescription for Concerta 36 mg daily.  No other med changes.  She remained on Abilify 5 mg daily, lamotrigine 200 mg daily.  She called May 22, 2019 stating that she was having panic attacks and asked for prescription for Ativan.  It was okayed for her to use sparingly.  seen October 05, 2019.  She described a pattern of hypersexuality as well as some other manic symptoms that she and her therapist had discussed.  We decided to switch from Abilify to Depakote as a  mood stabilizer.  11/16/2019 appointment with the following noted: Did switch sx.  Episodes mania are gone but more depression.  Couple weeks ago didn't want to leave the house.  Abilify did better with the depression. No manic sx in the last 3 weeks.  No further hypersexuality resolved.  Sleeping well.  Some racing thoughts but better.  Not irritable..  Some anxiety situational.   Concerta affordable. 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   Doing well without major issues.    Last visit changed to Metadate CD and that worked well but over $100.  Better energy and focus and more productive.  Tolerated well.  Ran out.  Dose seems good. Does ok without stimulant but better focus and energy and happier on it.  Be nice if insurance would cover it. Plan:Unfortunately she's been more depressed and has a tremor. Increase lamotrigine to 150 mg daily to help with depression BC tremor reduce Depakote to 750 mg HS.  Disc risk return of mania. Follow-up 8 weeks.  March 28, 2020 appointment with the following noted Fine.  Hardly ever take Concerta bc it caused the tremor. Disc goal to get pregnant and need to get off Depakote. Rare panic.  Tolerates Ativan.  Ok off Concerta. Not depressed.  No manic behavior. Plan: Wean Depakote for pregnancy over 10 days.  Disc risk of this causing birth defects etc.  Disc risk mood swings without it.  Call. First step would be to increase lamotrigine.  TC 06/06/20 Pt with rapid mood swings off Depakote. Might be pregnant. Needs another stabilizer. Took Latuda before but stopped DT cost. Agrees to restart bc it's one of safest in pregnancy. Will send in RX for 40 mg Latuda.  07/02/2020 appointment with the following noted: Not pregnant. "I feel 10 times better" in a few days.  Happier and more productive.  No crying anymore. No significant anger like before.  Sleep is good. Last Friday was dizzy and told she could be having SE.   He told her to cut Latuda to 20 and then increase it back to 40 mg.  Back on 40 mg for 4-5 days.   Plan: Off Depakote and on Latuda & lamotrigine  bc pursuing pregnancy and cannot have stability without medication.  10/01/2020 appointment with the following noted: Mood pretty stable except occ moodiness with PMS.  No sig depression.  Anxiety manageable with 2-3 Ativan including when flying.  No major problems.  No SE. Pursuing pregnancy.    Patient reports stable mood and denies depressed or irritable moods.  Patient denies any recent difficulty with anxiety.  Patient denies difficulty with sleep initiation or maintenance. Denies appetite disturbance.  Patient reports that energy and motivation have been good.  Patient denies any difficulty with concentration.  Patient denies any suicidal ideation.  1 D 45yo.  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  Past Psychiatric Medication Trials: Citalopram, venlafaxine, Wellbutrin,  lamotrigine 200, Latuda cost, Vraylar side effects, Abilify 5,   Rexulti Ritalin,   Review of Systems:  Review of Systems  Cardiovascular: Negative for chest pain.  Neurological: Negative for tremors and weakness.    Medications: I have reviewed the patient's current medications.  Current Outpatient Medications  Medication Sig Dispense Refill  . aspirin EC 81 MG tablet Take 1 tablet (81 mg total) by mouth daily. 300 tablet 2  . levothyroxine (SYNTHROID) 25 MCG tablet TK 1 TAB BY MOUTH EVERY MORNING AT LEAST 30 60 MINS BEFORE BREAKFAST W/ A GLASS OF WATER    . Norgestimate-Ethinyl Estradiol Triphasic (TRI-LO-MARZIA) 0.18/0.215/0.25 MG-25 MCG tab Take 1 tablet by mouth daily. 84 tablet 3  . Vitamin D, Ergocalciferol, (DRISDOL) 1.25 MG (50000 UNIT) CAPS capsule Take 1 capsule (50,000 Units total) by mouth every 7 (seven) days. 14 capsule 1  . lamoTRIgine (LAMICTAL) 100 MG tablet Take 2 tablets (200 mg total) by mouth daily. 180 tablet 1  . LORazepam (ATIVAN) 1 MG tablet  Take 1 tablet (1 mg total) by mouth every 8 (eight) hours as needed for anxiety. 30 tablet 0  . lurasidone (LATUDA) 80 MG TABS tablet Take 1 tablet (80 mg total) by mouth daily. TAKE 1 TABLET (80 MG TOTAL) BY MOUTH DAILY WITH BREAKFAST. 30 tablet 5   No current facility-administered medications for this visit.    Medication Side Effects as noted  Allergies: No Known Allergies  Past Medical History:  Diagnosis Date  . Abnormal Pap smear of cervix   . Depression   . HPV in female     Family History  Problem Relation Age of Onset  . Thyroid disease Mother   . Diabetes Father   . Heart disease Father   . Diabetes Paternal Grandfather   . Heart disease Paternal Grandfather     Social History   Socioeconomic History  . Marital status: Divorced    Spouse name: Not on file  . Number of children: 1  . Years of education:  Not on file  . Highest education level: Not on file  Occupational History  . Not on file  Tobacco Use  . Smoking status: Never Smoker  . Smokeless tobacco: Never Used  Vaping Use  . Vaping Use: Every day  Substance and Sexual Activity  . Alcohol use: Yes    Comment: rare  . Drug use: No  . Sexual activity: Yes    Birth control/protection: Pill    Comment: vasectomy  Other Topics Concern  . Not on file  Social History Narrative   73 year old daughter    Social Determinants of Corporate investment banker Strain: Not on file  Food Insecurity: Not on file  Transportation Needs: Not on file  Physical Activity: Not on file  Stress: Not on file  Social Connections: Not on file  Intimate Partner Violence: Not on file    Past Medical History, Surgical history, Social history, and Family history were reviewed and updated as appropriate.   Please see review of systems for further details on the patient's review from today.   Objective:   Physical Exam:  There were no vitals taken for this visit.  Physical Exam Constitutional:      General: She  is not in acute distress. Musculoskeletal:        General: No deformity.  Neurological:     Mental Status: She is alert and oriented to person, place, and time.     Coordination: Coordination normal.  Psychiatric:        Attention and Perception: Attention and perception normal. She does not perceive auditory or visual hallucinations.        Mood and Affect: Mood is anxious. Mood is not depressed. Affect is not labile, blunt, angry or inappropriate.        Speech: Speech normal.        Behavior: Behavior normal.        Thought Content: Thought content normal. Thought content is not paranoid or delusional. Thought content does not include homicidal or suicidal ideation. Thought content does not include homicidal or suicidal plan.        Cognition and Memory: Cognition and memory normal.        Judgment: Judgment normal.     Comments: Insight intact Intermittent mostly situational anxiety     Lab Review:     Component Value Date/Time   NA 139 12/26/2018 0145   NA 141 01/17/2017 1714   K 3.9 12/26/2018 0145   CL 106 12/26/2018 0145   CO2 20 (L) 12/26/2018 0145   GLUCOSE 141 (H) 12/26/2018 0145   BUN 11 12/26/2018 0145   BUN 10 01/17/2017 1714   CREATININE 0.71 12/26/2018 0145   CALCIUM 8.6 (L) 12/26/2018 0145   PROT 7.1 12/26/2018 0145   PROT 7.7 01/17/2017 1714   ALBUMIN 3.6 12/26/2018 0145   ALBUMIN 4.7 01/17/2017 1714   AST 19 12/26/2018 0145   ALT 20 12/26/2018 0145   ALKPHOS 65 12/26/2018 0145   BILITOT 0.3 12/26/2018 0145   BILITOT 0.3 01/17/2017 1714   GFRNONAA >60 12/26/2018 0145   GFRAA >60 12/26/2018 0145       Component Value Date/Time   WBC 12.4 (H) 12/26/2018 0145   RBC 4.63 12/26/2018 0145   HGB 12.6 12/26/2018 0145   HCT 38.1 12/26/2018 0145   PLT 310 12/26/2018 0145   MCV 82.3 12/26/2018 0145   MCH 27.2 12/26/2018 0145   MCHC 33.1 12/26/2018 0145   RDW 13.2 12/26/2018 0145  No results found for: POCLITH, LITHIUM   No results found for:  PHENYTOIN, PHENOBARB, VALPROATE, CBMZ   .res Assessment: Plan:    Tashia was seen today for follow-up, depression, anxiety and adhd.  Diagnoses and all orders for this visit:  Bipolar I disorder (HCC) -     lurasidone (LATUDA) 80 MG TABS tablet; Take 1 tablet (80 mg total) by mouth daily. TAKE 1 TABLET (80 MG TOTAL) BY MOUTH DAILY WITH BREAKFAST. -     lamoTRIgine (LAMICTAL) 100 MG tablet; Take 2 tablets (200 mg total) by mouth daily.  Panic disorder with agoraphobia -     LORazepam (ATIVAN) 1 MG tablet; Take 1 tablet (1 mg total) by mouth every 8 (eight) hours as needed for anxiety.  Attention deficit hyperactivity disorder (ADHD), combined type    Overall mood stability is good at this time with a combination of Depakote 750 mg daily with lamotrigine 150 mg daily.  Anxiety is generally manageable.  She is doing okay with ADD despite bright not using a stimulant at this time.  Extensive discussion with great detail surrounding her goal to get pregnant in the short-term and having bipolar disorder.  Discussed the risks with and without medication.  Discussed the option and alternative mood stabilizers that have relatively lower risk in pregnancy and those that have higher risk such as Depakote.  Depakote stopped.  Lamotrigine is considered relatively safe for her.  Also discussed options like Latuda but cost was a barrier with that medication.  Discussed the fact that lamotrigine is less effective at preventing mania and some of the other mood stabilizers but should provide some benefit for that purpose.  She elected to continue lamotrigine and wean off Depakote. We may have to increase the dose to compensate.  She will let us know if she starts seeing increased mood swings.  Off Depakote and on Latuda bc pursuing pregnancy and cannot have stability without medication.  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  Okay as needed lorazepam for rare panic attacks.  Needs Ativan for trips.    Rec Choline in pursuit of  pregnancy and disc 2 recent studies..  OK lorazepam rarely and not recommended when pregnant.  We discussed the short-term risks associated with benzodiazepines including sedation and increased fall risk among others.  Discussed long-term side effect risk including dependence, potential withdrawal symptoms, and the potential eventual dose-related risk of dementia.  Will not drive at destination.  Ativan 1 mg 1-2 before trip if needed for flying phobia Disc risk in pregnancy.  FU 3 mos  Meredith Staggers, MD, DFAPA Please see After Visit Summary for patient specific instructions.  No future appointments.  No orders of the defined types were placed in this encounter.   -------------------------------

## 2020-10-16 ENCOUNTER — Other Ambulatory Visit: Payer: Self-pay | Admitting: Certified Nurse Midwife

## 2020-10-24 ENCOUNTER — Other Ambulatory Visit: Payer: Self-pay | Admitting: Internal Medicine

## 2020-10-24 DIAGNOSIS — Z1231 Encounter for screening mammogram for malignant neoplasm of breast: Secondary | ICD-10-CM

## 2020-11-21 ENCOUNTER — Ambulatory Visit (INDEPENDENT_AMBULATORY_CARE_PROVIDER_SITE_OTHER): Payer: BC Managed Care – PPO | Admitting: Certified Nurse Midwife

## 2020-11-21 ENCOUNTER — Ambulatory Visit
Admission: RE | Admit: 2020-11-21 | Discharge: 2020-11-21 | Disposition: A | Discharge status: Home / Self Care | Payer: BC Managed Care – PPO | Source: Ambulatory Visit | Attending: Internal Medicine | Admitting: Internal Medicine

## 2020-11-21 ENCOUNTER — Encounter: Payer: Self-pay | Admitting: Certified Nurse Midwife

## 2020-11-21 ENCOUNTER — Other Ambulatory Visit: Payer: Self-pay

## 2020-11-21 VITALS — BP 144/80 | Ht 65.0 in | Wt 243.0 lb

## 2020-11-21 DIAGNOSIS — Z319 Encounter for procreative management, unspecified: Secondary | ICD-10-CM

## 2020-11-21 DIAGNOSIS — N979 Female infertility, unspecified: Secondary | ICD-10-CM

## 2020-11-21 DIAGNOSIS — Z1231 Encounter for screening mammogram for malignant neoplasm of breast: Secondary | ICD-10-CM | POA: Insufficient documentation

## 2020-11-21 DIAGNOSIS — N940 Mittelschmerz: Secondary | ICD-10-CM

## 2020-11-21 NOTE — Progress Notes (Signed)
GYN ENCOUNTER NOTE  Subjective:       Lisa Schroeder is a 45 y.o. G22P1001 female here to discuss infertility and left lower pelvic cramping after ovulation.   Cycle tracking and having well timed intercourse without pregnancy since 03/2020.   Completed home semen analysis with spouse this year, results were equivocal.   Denies difficulty breathing or respiratory distress, chest pain, abdominal pain, excessive vaginal bleeding, dysuria, and leg pain or swelling.    Gynecologic History  Patient's last menstrual period was 11/12/2020 (exact date).  Contraception: none  Last Pap: 12/2019. Results were: abnormal; colposcopy 02/2020, repeat pap in one (1) year   Last mammogram: today. Results are pending.   Obstetric History  OB History  Gravida Para Term Preterm AB Living  1 1 1     1   SAB IAB Ectopic Multiple Live Births          1    # Outcome Date GA Lbr Len/2nd Weight Sex Delivery Anes PTL Lv  1 Term 12/23/05   7 lb 2 oz (3.232 kg) F Vag-Spont  N LIV    Past Medical History:  Diagnosis Date  . Abnormal Pap smear of cervix   . Depression   . HPV in female     Past Surgical History:  Procedure Laterality Date  . COLPOSCOPY     for many yrs per pt  . NECK SURGERY Left    lumb notes     Current Outpatient Medications on File Prior to Visit  Medication Sig Dispense Refill  . aspirin EC 81 MG tablet Take 1 tablet (81 mg total) by mouth daily. 300 tablet 2  . lamoTRIgine (LAMICTAL) 100 MG tablet Take 2 tablets (200 mg total) by mouth daily. 180 tablet 1  . levothyroxine (SYNTHROID) 25 MCG tablet TK 1 TAB BY MOUTH EVERY MORNING AT LEAST 30 60 MINS BEFORE BREAKFAST W/ A GLASS OF WATER    . LORazepam (ATIVAN) 1 MG tablet Take 1 tablet (1 mg total) by mouth every 8 (eight) hours as needed for anxiety. 30 tablet 0  . lurasidone (LATUDA) 80 MG TABS tablet Take 1 tablet (80 mg total) by mouth daily. TAKE 1 TABLET (80 MG TOTAL) BY MOUTH DAILY WITH BREAKFAST. 30 tablet 5  .  Vitamin D, Ergocalciferol, (DRISDOL) 1.25 MG (50000 UNIT) CAPS capsule TAKE 1 CAPSULE (50,000 UNITS TOTAL) BY MOUTH EVERY 7 (SEVEN) DAYS. 13 capsule 2  . Norgestimate-Ethinyl Estradiol Triphasic (TRI-LO-MARZIA) 0.18/0.215/0.25 MG-25 MCG tab Take 1 tablet by mouth daily. (Patient not taking: Reported on 11/21/2020) 84 tablet 3   No current facility-administered medications on file prior to visit.    No Known Allergies  Social History   Socioeconomic History  . Marital status: Divorced    Spouse name: Not on file  . Number of children: 1  . Years of education: Not on file  . Highest education level: Not on file  Occupational History  . Not on file  Tobacco Use  . Smoking status: Never Smoker  . Smokeless tobacco: Never Used  Vaping Use  . Vaping Use: Every day  Substance and Sexual Activity  . Alcohol use: Yes    Comment: rare  . Drug use: No  . Sexual activity: Yes    Birth control/protection: None  Other Topics Concern  . Not on file  Social History Narrative   49 year old daughter    Social Determinants of 18 Strain: Not on Corporate investment banker  Insecurity: Not on file  Transportation Needs: Not on file  Physical Activity: Not on file  Stress: Not on file  Social Connections: Not on file  Intimate Partner Violence: Not on file    Family History  Problem Relation Age of Onset  . Thyroid disease Mother   . Diabetes Father   . Heart disease Father   . Diabetes Paternal Grandfather   . Heart disease Paternal Grandfather     The following portions of the patient's history were reviewed and updated as appropriate: allergies, current medications, past family history, past medical history, past social history, past surgical history and problem list.  Review of Systems  ROS negative except as noted above. Information obtained from patient.   Objective:   BP (!) 144/80   Ht 5\' 5"  (1.651 m)   Wt 243 lb (110.2 kg)   LMP 11/12/2020 (Exact Date)   BMI  40.44 kg/m   CONSTITUTIONAL: Well-developed, well-nourished female in no acute distress.   PHYSICAL EXAM: Not indicated.   Assessment:   1. Patient desires pregnancy  - FSH/LH - Anti mullerian hormone - Estradiol - TSH - Prolactin  2. Infertility, female  - FSH/LH - Anti mullerian hormone - Estradiol - TSH - Prolactin  3. Mittelschmerz   Plan:   Discussed work up for infertility and referral to reproductive endocrinology, patient verbalized understanding.   Labs: see orders, patient to return on Cycle Day 3.   Information provided for Semen Analysis.   Reviewed red flag symptoms and when to call.   RTC as previously scheduled or sooner if needed.    11/14/2020, CNM [facility], [facility] 11/21/20 4:59 PM

## 2020-11-21 NOTE — Patient Instructions (Signed)
Female Infertility  Female infertility refers to a woman's inability to get pregnant (conceive) after a year of having sex regularly (or after 6 months in women over age 44) without using birth control. Infertility can also mean that a woman is not able to carry a pregnancy to full term. Both women and men can have fertility problems. What are the causes? This condition may be caused by:  Problems with reproductive organs. Infertility can result if a woman: ? Has an abnormally short cervix or a cervix that does not remain closed during a pregnancy. ? Has a blockage or scarring in the fallopian tubes. ? Has an abnormally shaped uterus. ? Has uterine fibroids. This is a benign mass of tissue or muscle (tumor) that can develop in the uterus. ? Is not ovulating in a regular way.  Certain medical conditions. These may include: ? Polycystic ovary syndrome (PCOS). This is a hormonal disorder that can cause small cysts to grow on the ovaries. This is the most common cause of infertility in women. ? Endometriosis. This is a condition in which the tissue that lines the uterus (endometrium) grows outside of its normal location. ? Cancer and cancer treatments, such as chemotherapy or radiation. ? Premature ovarian failure. This is when ovaries stop producing eggs and hormones before age 43. ? Sexually transmitted diseases, such as chlamydia or gonorrhea. ? Autoimmune disorders. These are disorders in which the body's defense system (immune system) attacks normal, healthy cells. Infertility can be linked to more than one cause. For some women, the cause of infertility is not known (unexplained infertility). What increases the risk?  Age. A woman's fertility declines with age, especially after her mid-62s.  Being underweight or overweight.  Drinking too much alcohol.  Using drugs such as anabolic steroids, cocaine, and marijuana.  Exercising excessively.  Being exposed to environmental toxins,  such as radiation, pesticides, and certain chemicals. What are the signs or symptoms? The main sign of infertility in women is the inability to get pregnant or carry a pregnancy to full term. How is this diagnosed? This condition may be diagnosed by:  Checking whether you are ovulating each month. The tests may include: ? Blood tests to check hormone levels. ? An ultrasound of the ovaries. ? Taking a small tissue that lines the uterus and checking it under a microscope (endometrial biopsy).  Doing additional tests. This is done if ovulation is normal. Tests may include: ? Hysterosalpingography. This X-ray test can show the shape of the uterus and whether the fallopian tubes are open. ? Laparoscopy. This test uses a lighted tube (laparoscope) to look for problems in the fallopian tubes and other organs. ? Transvaginal ultrasound. This imaging test is used to check for abnormalities in the uterus and ovaries. ? Hysteroscopy. This test uses a lighted tube to check for problems in the cervix and the uterus. To be diagnosed with infertility, both partners will have a physical exam. Both partners will also have an extensive medical and sexual history taken. Additional tests may be done. How is this treated? Treatment depends on the cause of infertility. Most cases of infertility in women are treated with medicine or surgery.  Women may take medicine to: ? Correct ovulation problems. ? Treat other health conditions.  Surgery may be done to: ? Repair damage to the ovaries, fallopian tubes, cervix, or uterus. ? Remove growths from the uterus. ? Remove scar tissue from the uterus, pelvis, or other organs. Assisted reproductive technology (ART) Assisted reproductive technology (  ART) refers to all treatments and procedures that combine eggs and sperm outside the body to try to help a couple conceive. ART is often combined with fertility drugs to stimulate ovulation. Sometimes ART is done using eggs  retrieved from another woman's body (donor eggs) or from previously frozen fertilized eggs (embryos). There are different types of ART. These include:  Intrauterine insemination (IUI). A long, thin tube is used to place sperm directly into a woman's uterus. This procedure: ? Is effective for infertility caused by sperm problems, including low sperm count and low motility. ? Can be used in combination with fertility drugs.  In vitro fertilization (IVF). This is done when a woman's fallopian tubes are blocked or when a man has low sperm count. In this procedure: ? Fertility drugs are used to stimulate the ovaries to produce multiple eggs. ? Once mature, these eggs are removed from the body and combined with the sperm to be fertilized. ? The fertilized eggs are then placed into the woman's uterus. Follow these instructions at home:  Take over-the-counter and prescription medicines only as told by your health care provider.  Do not use any products that contain nicotine or tobacco, such as cigarettes and e-cigarettes. If you need help quitting, ask your health care provider.  If you drink alcohol, limit how much you have to 1 drink a day.  Make dietary changes to lose weight or maintain a healthy weight. Work with your health care provider and a dietitian to set a weight-loss goal that is healthy and reasonable for you.  Seek support from a counselor or support group to talk about your concerns related to infertility. Couples counseling may be helpful for you and your partner.  Practice stress reduction techniques that work well for you, such as regular physical activity, meditation, or deep breathing.  Keep all follow-up visits as told by your health care provider. This is important. Contact a health care provider if you:  Feel that stress is interfering with your life and relationships.  Have side effects from treatments for infertility. Summary  Female infertility refers to a woman's  inability to get pregnant (conceive) after a year of having sex regularly (or after 6 months in women over age 44) without using birth control.  To be diagnosed with infertility, both partners will have a physical exam. Both partners will also have an extensive medical and sexual history taken.  Seek support from a counselor or support group to talk about your concerns related to infertility. Couples counseling may be helpful for you and your partner. This information is not intended to replace advice given to you by your health care provider. Make sure you discuss any questions you have with your health care provider. Document Revised: 06/26/2020 Document Reviewed: 05/16/2020 Elsevier Patient Education  2021 [employer]   Mittelschmerz Mittelschmerz is pain in the belly (abdomen) that affects women. The pain happens between periods. The pain:  Affects one side of the belly. The side may change from month to month.  May be mild or very bad.  May last from minutes to hours. It does not last longer than 1-2 days.  Can happen with a feeling of being sick to your stomach (nausea).  Can happen with a small amount of bleeding from your vagina. Mittelschmerz is caused by the growth and release of an egg from an ovary. It is a natural part of the ovulation cycle. It often happens about 2 weeks after a woman's period ends. Follow these  instructions at home: Watch for any changes in your condition. Let your doctor know about them. Take these actions to help with your pain:  Try soaking in a hot bath.  Try a heating pad to relax the muscles in the belly.  Take over-the-counter and prescription medicines only as told by your doctor.  Keep all follow-up visits as told by your doctor. This is important.   Contact a doctor if:  The pain is very bad most months.  The pain lasts longer than 24 hours.  Your pain medicine is not helping.  You have a fever.  You feel sick to your stomach,  and the feeling does not go away.  You keep throwing up (vomiting).  You miss your period.  You have more than a small amount of blood coming from your vagina between periods. Summary  Mittelschmerz is a condition that affects women. It is pain in the belly that happens between periods.  This condition is caused by the growth and release of an egg from an ovary.  The pain may affect one side of the belly, may be mild or very bad, or may cause you to feel sick to your stomach.  To help with your pain, soak in a hot bath, use a heating pad, or take medicines.  Watch for any changes in your condition. Know when to contact a doctor for help. This information is not intended to replace advice given to you by your health care provider. Make sure you discuss any questions you have with your health care provider. Document Revised: 03/28/2018 Document Reviewed: 03/28/2018 Elsevier Patient Education  2021 [employer].

## 2020-12-12 ENCOUNTER — Other Ambulatory Visit: Payer: BC Managed Care – PPO

## 2020-12-12 ENCOUNTER — Other Ambulatory Visit: Payer: Self-pay

## 2020-12-19 ENCOUNTER — Telehealth: Payer: Self-pay | Admitting: Obstetrics and Gynecology

## 2020-12-19 NOTE — Telephone Encounter (Signed)
New message    Calling for test results  

## 2020-12-19 NOTE — Telephone Encounter (Signed)
Please advise. Thanks Fikisha 

## 2020-12-20 LAB — ESTRADIOL: Estradiol: 17.1 pg/mL

## 2020-12-20 LAB — ANTI MULLERIAN HORMONE: ANTI-MULLERIAN HORMONE (AMH): 0.353 ng/mL

## 2020-12-20 LAB — FSH/LH
FSH: 10.5 m[IU]/mL
LH: 5.5 m[IU]/mL

## 2020-12-20 LAB — TSH: TSH: 3.68 u[IU]/mL (ref 0.450–4.500)

## 2020-12-20 LAB — PROLACTIN: Prolactin: 11.3 ng/mL (ref 4.8–23.3)

## 2020-12-22 NOTE — Telephone Encounter (Signed)
MyChart message sent. Thanks, JML

## 2020-12-25 ENCOUNTER — Other Ambulatory Visit: Payer: Self-pay | Admitting: Certified Nurse Midwife

## 2020-12-25 DIAGNOSIS — E349 Endocrine disorder, unspecified: Secondary | ICD-10-CM

## 2020-12-25 DIAGNOSIS — R7989 Other specified abnormal findings of blood chemistry: Secondary | ICD-10-CM

## 2020-12-25 DIAGNOSIS — Z319 Encounter for procreative management, unspecified: Secondary | ICD-10-CM

## 2020-12-25 DIAGNOSIS — N979 Female infertility, unspecified: Secondary | ICD-10-CM

## 2020-12-25 NOTE — Progress Notes (Signed)
Referral to Infertility, see orders.    Lisa Schroeder, CNM [facility], [facility] 12/25/20 5:02 PM

## 2021-01-06 ENCOUNTER — Other Ambulatory Visit: Payer: Self-pay | Admitting: Psychiatry

## 2021-02-19 ENCOUNTER — Telehealth (INDEPENDENT_AMBULATORY_CARE_PROVIDER_SITE_OTHER): Payer: BC Managed Care – PPO | Admitting: Psychiatry

## 2021-02-19 ENCOUNTER — Encounter: Payer: Self-pay | Admitting: Psychiatry

## 2021-02-19 DIAGNOSIS — F902 Attention-deficit hyperactivity disorder, combined type: Secondary | ICD-10-CM

## 2021-02-19 DIAGNOSIS — F4001 Agoraphobia with panic disorder: Secondary | ICD-10-CM

## 2021-02-19 DIAGNOSIS — F319 Bipolar disorder, unspecified: Secondary | ICD-10-CM

## 2021-02-19 MED ORDER — LURASIDONE HCL 120 MG PO TABS: 120.0000 mg | ORAL_TABLET | Freq: Every day | ORAL | 1 refills | Status: DC

## 2021-02-19 NOTE — Progress Notes (Signed)
Lisa Schroeder [id number] 09-11-1976 45 y.o.   Video Visit via My Chart  I connected with pt by My Chart and verified that I am speaking with the correct person using two identifiers.   I discussed the limitations, risks, security and privacy concerns of performing an evaluation and management service by My Chart  and the availability of in person appointments. I also discussed with the patient that there may be a patient responsible charge related to this service. The patient expressed understanding and agreed to proceed.  I discussed the assessment and treatment plan with the patient. The patient was provided an opportunity to ask questions and all were answered. The patient agreed with the plan and demonstrated an understanding of the instructions.   The patient was advised to call back or seek an in-person evaluation if the symptoms worsen or if the condition fails to improve as anticipated.  I provided 30 minutes of video time during this encounter.  The patient was located at home and the provider was located office. Session started [phone]  Subjective:   Patient ID:  Lisa Schroeder is a 45 y.o. (DOB 02-Apr-1976) female.  Chief Complaint:  Chief Complaint  Patient presents with  . Follow-up  . Bipolar I disorder [facility])    HPI Lisa Schroeder presents to the office today for follow-up of bipolar disorder and anxiety disorders.  visit June 2020.  She could not afford Metadate so she was given a prescription for Concerta 36 mg daily.  No other med changes.  She remained on Abilify 5 mg daily, lamotrigine 200 mg daily.  She called May 22, 2019 stating that she was having panic attacks and asked for prescription for Ativan.  It was okayed for her to use sparingly.  seen October 05, 2019.  She described a pattern of hypersexuality as well as some other manic symptoms that she and her therapist had discussed.  We decided to switch from Abilify to Depakote as a mood  stabilizer.  11/16/2019 appointment with the following noted: Did switch sx.  Episodes mania are gone but more depression.  Couple weeks ago didn't want to leave the house.  Abilify did better with the depression. No manic sx in the last 3 weeks.  No further hypersexuality resolved.  Sleeping well.  Some racing thoughts but better.  Not irritable..  Some anxiety situational.   Concerta affordable. 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   Doing well without major issues.    Last visit changed to Metadate CD and that worked well but over $100.  Better energy and focus and more productive.  Tolerated well.  Ran out.  Dose seems good. Does ok without stimulant but better focus and energy and happier on it.  Be nice if insurance would cover it. Plan:Unfortunately she's been more depressed and has a tremor. Increase lamotrigine to 150 mg daily to help with depression BC tremor reduce Depakote to 750 mg HS.  Disc risk return of mania. Follow-up 8 weeks.  March 28, 2020 appointment with the following noted Fine.  Hardly ever take Concerta bc it caused the tremor. Disc goal to get pregnant and need to get off Depakote. Rare panic.  Tolerates Ativan.  Ok off Concerta. Not depressed.  No manic behavior. Plan: Wean Depakote for pregnancy over 10 days.  Disc risk of this causing birth defects etc.  Disc risk mood swings without it.  Call. First step would be to increase  lamotrigine.  TC 06/06/20 Pt with rapid mood swings off Depakote. Might be pregnant. Needs another stabilizer. Took Latuda before but stopped DT cost. Agrees to restart bc it's one of safest in pregnancy. Will send in RX for 40 mg Latuda.  07/02/2020 appointment with the following noted: Not pregnant. "I feel 10 times better" in a few days.  Happier and more productive.  No crying anymore. No significant anger like before.  Sleep is good. Last Friday was dizzy and told she could be having SE.  He  told her to cut Latuda to 20 and then increase it back to 40 mg.  Back on 40 mg for 4-5 days.   Plan: Off Depakote and on Latuda & lamotrigine  bc pursuing pregnancy and cannot have stability without medication.  10/01/2020 appointment with the following noted: Mood pretty stable except occ moodiness with PMS.  No sig depression.  Anxiety manageable with 2-3 Ativan including when flying.  No major problems.  No SE. Pursuing pregnancy.   Plan: no changes  02/19/21 appt noted: Recent anger outbursts a couple of times per week.  Including today at work which is unusual..  2 weeks depression and didn't want to get out of bed but also on menstrual cycle.  Lasted a day and resolved. Hungry lately.  No change in meds.  Energy seems down overall.  No particular trigger for these changes.  Says she can handle fatigue better than she can handle the mood swings.  Patient reports stable mood and denies depressed or irritable moods.  Patient denies any recent difficulty with anxiety.  Patient denies difficulty with sleep initiation or maintenance. Denies appetite disturbance. .  Patient denies any difficulty with concentration.  Patient denies any suicidal ideation.  1 D 45yo.  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  Past Psychiatric Medication Trials: Citalopram, venlafaxine, Wellbutrin,  lamotrigine 200, Latuda cost, Vraylar side effects, Abilify 5,   Rexulti Ritalin,    Review of Systems:  Review of Systems  Constitutional: Positive for fatigue.  Cardiovascular: Negative for chest pain.  Neurological: Negative for tremors and weakness.    Medications: I have reviewed the patient's current medications.  Current Outpatient Medications  Medication Sig Dispense Refill  . aspirin EC 81 MG tablet Take 1 tablet (81 mg total) by mouth daily. 300 tablet 2  . lamoTRIgine (LAMICTAL) 100 MG tablet Take 2 tablets (200 mg total) by mouth daily. 180 tablet 1  . levothyroxine (SYNTHROID) 25 MCG tablet TK 1 TAB  BY MOUTH EVERY MORNING AT LEAST 30 60 MINS BEFORE BREAKFAST W/ A GLASS OF WATER    . LORazepam (ATIVAN) 1 MG tablet Take 1 tablet (1 mg total) by mouth every 8 (eight) hours as needed for anxiety. 30 tablet 0  . Vitamin D, Ergocalciferol, (DRISDOL) 1.25 MG (50000 UNIT) CAPS capsule TAKE 1 CAPSULE (50,000 UNITS TOTAL) BY MOUTH EVERY 7 (SEVEN) DAYS. 13 capsule 2  . Lurasidone HCl (LATUDA) 120 MG TABS Take 1 tablet (120 mg total) by mouth daily. TAKE 1 TABLET (80 MG TOTAL) BY MOUTH DAILY WITH BREAKFAST. 30 tablet 1  . Norgestimate-Ethinyl Estradiol Triphasic (TRI-LO-MARZIA) 0.18/0.215/0.25 MG-25 MCG tab Take 1 tablet by mouth daily. (Patient not taking: No sig reported) 84 tablet 3   No current facility-administered medications for this visit.    Medication Side Effects as noted  Allergies: No Known Allergies  Past Medical History:  Diagnosis Date  . Abnormal Pap smear of cervix   . Depression   . HPV in  female     Family History  Problem Relation Age of Onset  . Thyroid disease Mother   . Diabetes Father   . Heart disease Father   . Diabetes Paternal Grandfather   . Heart disease Paternal Grandfather     Social History   Socioeconomic History  . Marital status: Divorced    Spouse name: Not on file  . Number of children: 1  . Years of education: Not on file  . Highest education level: Not on file  Occupational History  . Not on file  Tobacco Use  . Smoking status: Never Smoker  . Smokeless tobacco: Never Used  Vaping Use  . Vaping Use: Every day  Substance and Sexual Activity  . Alcohol use: Yes    Comment: rare  . Drug use: No  . Sexual activity: Yes    Birth control/protection: None  Other Topics Concern  . Not on file  Social History Narrative   64 year old daughter    Social Determinants of Corporate investment banker Strain: Not on file  Food Insecurity: Not on file  Transportation Needs: Not on file  Physical Activity: Not on file  Stress: Not on file   Social Connections: Not on file  Intimate Partner Violence: Not on file    Past Medical History, Surgical history, Social history, and Family history were reviewed and updated as appropriate.   Please see review of systems for further details on the patient's review from today.   Objective:   Physical Exam:  There were no vitals taken for this visit.  Physical Exam Neurological:     Mental Status: She is alert and oriented to person, place, and time.     Cranial Nerves: No dysarthria.  Psychiatric:        Attention and Perception: Attention and perception normal.        Speech: Speech normal.        Behavior: Behavior is cooperative.        Thought Content: Thought content normal. Thought content is not paranoid or delusional. Thought content does not include homicidal or suicidal ideation. Thought content does not include homicidal or suicidal plan.        Cognition and Memory: Cognition and memory normal.        Judgment: Judgment normal.     Comments: Insight intact More irritability and mood swings     Lab Review:     Component Value Date/Time   NA 139 12/26/2018 0145   NA 141 01/17/2017 1714   K 3.9 12/26/2018 0145   CL 106 12/26/2018 0145   CO2 20 (L) 12/26/2018 0145   GLUCOSE 141 (H) 12/26/2018 0145   BUN 11 12/26/2018 0145   BUN 10 01/17/2017 1714   CREATININE 0.71 12/26/2018 0145   CALCIUM 8.6 (L) 12/26/2018 0145   PROT 7.1 12/26/2018 0145   PROT 7.7 01/17/2017 1714   ALBUMIN 3.6 12/26/2018 0145   ALBUMIN 4.7 01/17/2017 1714   AST 19 12/26/2018 0145   ALT 20 12/26/2018 0145   ALKPHOS 65 12/26/2018 0145   BILITOT 0.3 12/26/2018 0145   BILITOT 0.3 01/17/2017 1714   GFRNONAA >60 12/26/2018 0145   GFRAA >60 12/26/2018 0145       Component Value Date/Time   WBC 12.4 (H) 12/26/2018 0145   RBC 4.63 12/26/2018 0145   HGB 12.6 12/26/2018 0145   HCT 38.1 12/26/2018 0145   PLT 310 12/26/2018 0145   MCV 82.3 12/26/2018 0145  MCH 27.2 12/26/2018 0145    MCHC 33.1 12/26/2018 0145   RDW 13.2 12/26/2018 0145    No results found for: POCLITH, LITHIUM   No results found for: PHENYTOIN, PHENOBARB, VALPROATE, CBMZ   .res Assessment: Plan:    Alexza was seen today for follow-up and bipolar i disorder (hcc).  Diagnoses and all orders for this visit:  Bipolar I disorder (HCC) -     Lurasidone HCl (LATUDA) 120 MG TABS; Take 1 tablet (120 mg total) by mouth daily. TAKE 1 TABLET (80 MG TOTAL) BY MOUTH DAILY WITH BREAKFAST.  Panic disorder with agoraphobia  Attention deficit hyperactivity disorder (ADHD), combined type    Overall mood stability is a bit worse.  Patient has a history of good stability in mood with a combination of Depakote and Latuda.  Off Depakote and on Latuda bc pursuing pregnancy and cannot have stability without medication BC irritabilty   More anger and rapid mood swings including depression at times.  Symptoms are worse around her.  But not limited to that time. Anxiety is generally manageable.  She is doing okay with ADD despite bright not using a stimulant at this time.  Extensive discussion with great detail surrounding her goal to get pregnant in the short-term and having bipolar disorder.  Discussed the risks with and without medication.  Discussed the option and alternative mood stabilizers that have relatively lower risk in pregnancy and those that have higher risk such as Depakote.  Depakote stopped.  Lamotrigine is considered relatively safe for her.  Also discussed options like Latuda but cost was a barrier with that medication.  Discussed the fact that lamotrigine is less effective at preventing mania and some of the other mood stabilizers but should provide some benefit for that purpose.  She elected to continue lamotrigine and wean off Depakote.  Kasandra Knudsen is now more affordable  Because of recent mood swings increase Latuda from 40 mg to 60 mg daily.  Discussed the usual dosage range.  She is taking a relatively low  dose to be the primary mood stabilizer. Discussed potential metabolic side effects associated with atypical antipsychotics, as well as potential risk for movement side effects. Advised pt to contact office if movement side effects occur.   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  Okay as needed lorazepam for rare panic attacks.  Needs Ativan for trips.    Rec Choline in pursuit of pregnancy and disc 2 recent studies..  OK lorazepam rarely and not recommended when pregnant.  We discussed the short-term risks associated with benzodiazepines including sedation and increased fall risk among others.  Discussed long-term side effect risk including dependence, potential withdrawal symptoms, and the potential eventual dose-related risk of dementia.  Will not drive at destination.  Ativan 1 mg 1-2 before trip if needed for flying phobia Disc risk in pregnancy.  FU 2 mos  Meredith Staggers, MD, DFAPA Please see After Visit Summary for patient specific instructions.  Future Appointments  Date Time Provider Department Center  03/27/2021 10:00 AM Lawhorn, Vanessa Barbour, CNM [facility] None    No orders of the defined types were placed in this encounter.   -------------------------------

## 2021-02-20 NOTE — Telephone Encounter (Signed)
Yes, she is correct. I will contact her after clinic today. I didn't return to the office until yesterday. Thanks, JML

## 2021-02-20 NOTE — Telephone Encounter (Addendum)
Patient called saying she has not heard from anyone regarding the message you left.  Please advise.

## 2021-02-24 ENCOUNTER — Other Ambulatory Visit: Payer: Self-pay | Admitting: Certified Nurse Midwife

## 2021-02-24 DIAGNOSIS — R7989 Other specified abnormal findings of blood chemistry: Secondary | ICD-10-CM

## 2021-02-24 DIAGNOSIS — Z319 Encounter for procreative management, unspecified: Secondary | ICD-10-CM

## 2021-02-24 DIAGNOSIS — E349 Endocrine disorder, unspecified: Secondary | ICD-10-CM

## 2021-02-24 DIAGNOSIS — N979 Female infertility, unspecified: Secondary | ICD-10-CM

## 2021-02-24 NOTE — Progress Notes (Signed)
Referral to [facility], see orders.    Serafina Royals, CNM [facility], [facility] 02/24/21 11:58 AM

## 2021-03-06 ENCOUNTER — Encounter: Payer: BC Managed Care – PPO | Admitting: Certified Nurse Midwife

## 2021-03-10 ENCOUNTER — Other Ambulatory Visit: Payer: Self-pay | Admitting: Psychiatry

## 2021-03-10 DIAGNOSIS — F4001 Agoraphobia with panic disorder: Secondary | ICD-10-CM

## 2021-03-10 IMAGING — MG DIGITAL SCREENING BILAT W/ TOMO W/ CAD
8 series · 8 of 24 positions shown · non-contrast
Comparison: Previous exam(s).

CLINICAL DATA: Screening.

EXAM:
DIGITAL SCREENING BILATERAL MAMMOGRAM WITH TOMO AND CAD

[L CC synth-2D]
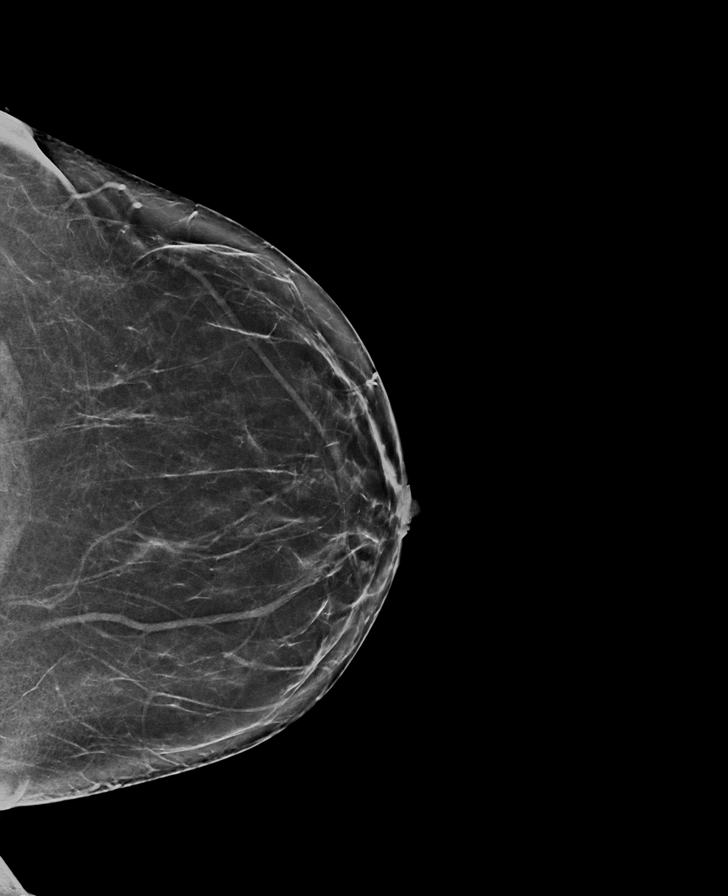

[R CC synth-2D]
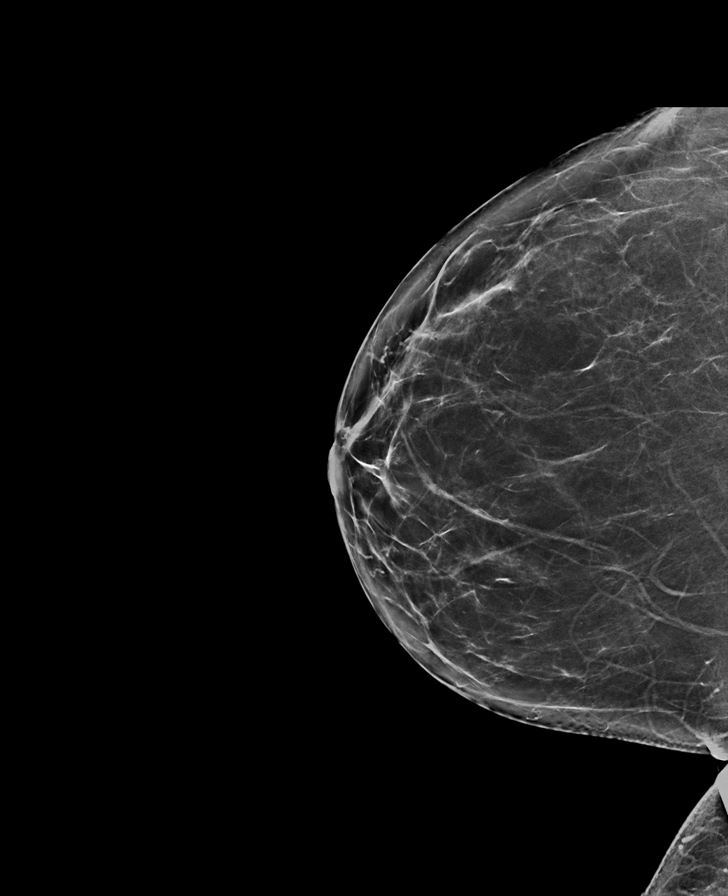

[L MLO synth-2D]
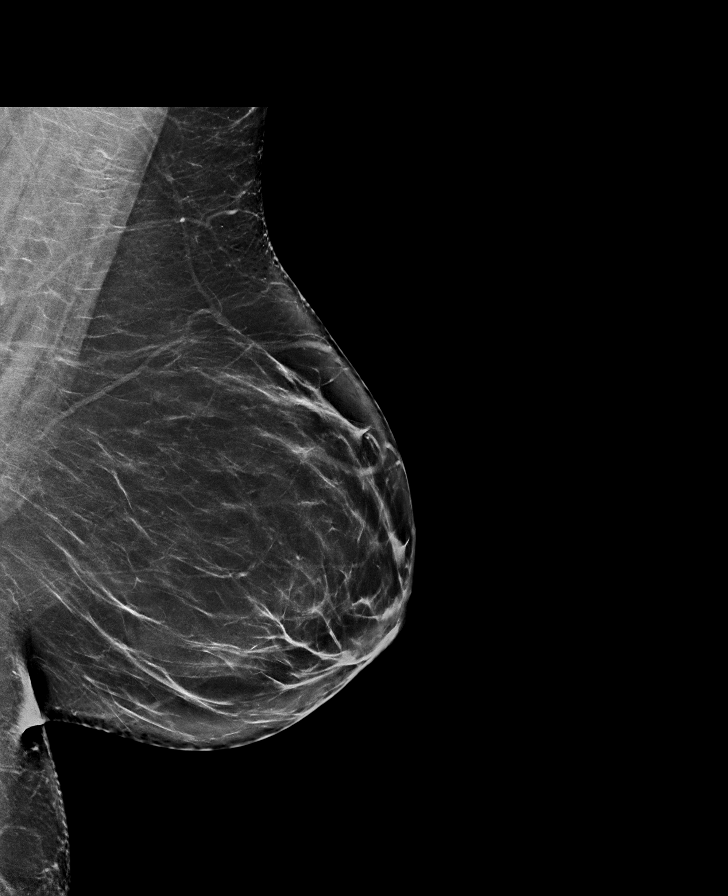

[R MLO synth-2D]
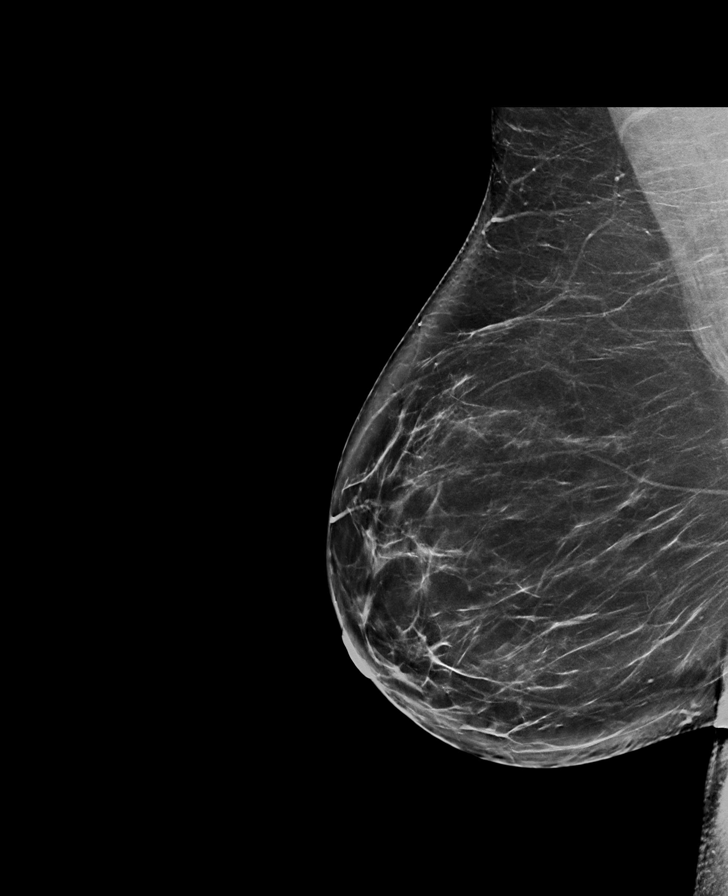

[L CC tomo · tomo slice 37/74.0]
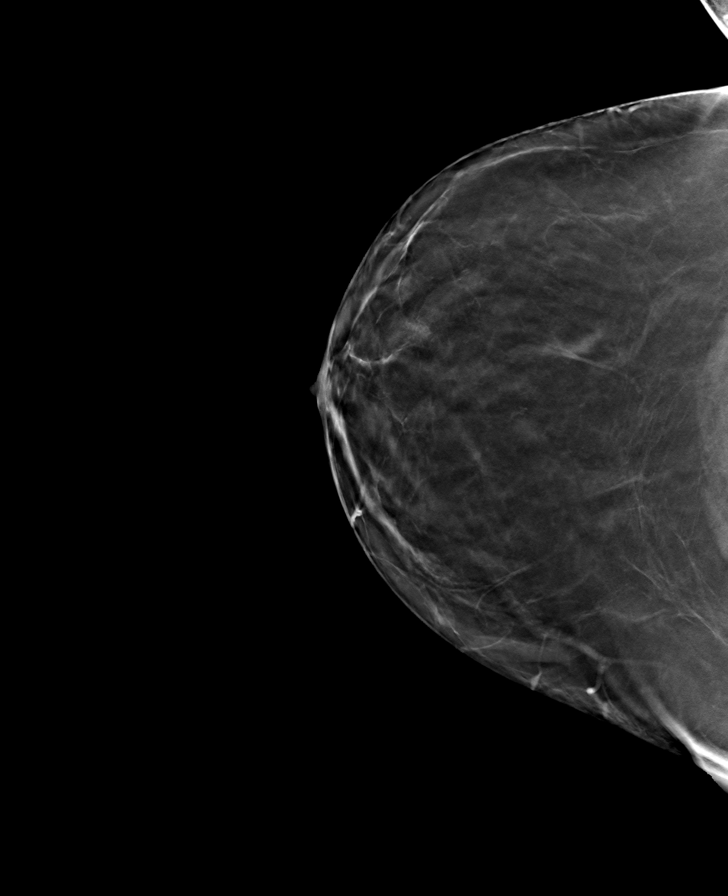

[L MLO tomo · tomo slice 45/89.0]
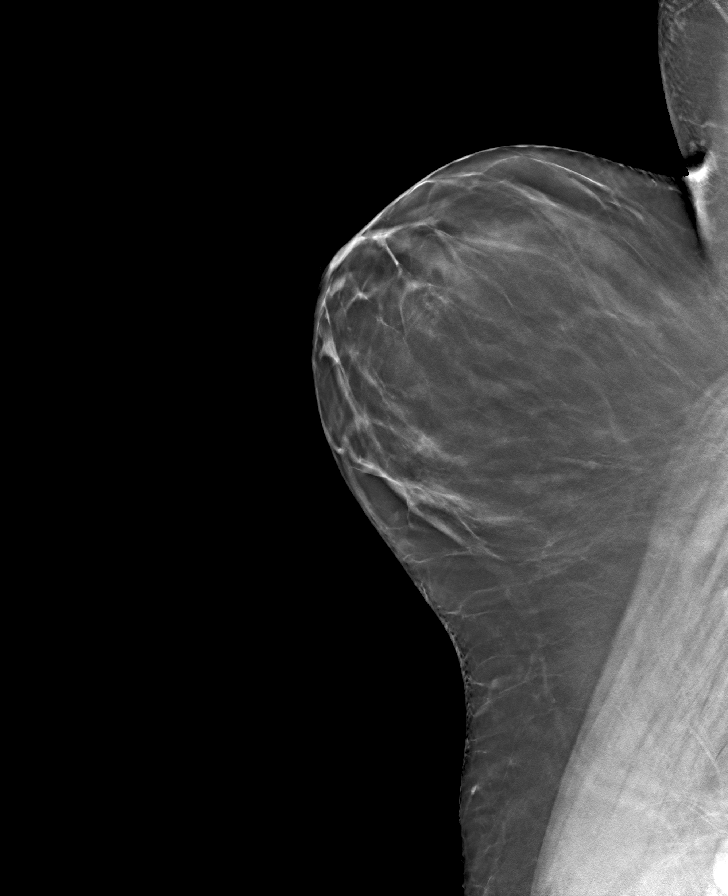

[R MLO tomo · tomo slice 43/85.0]
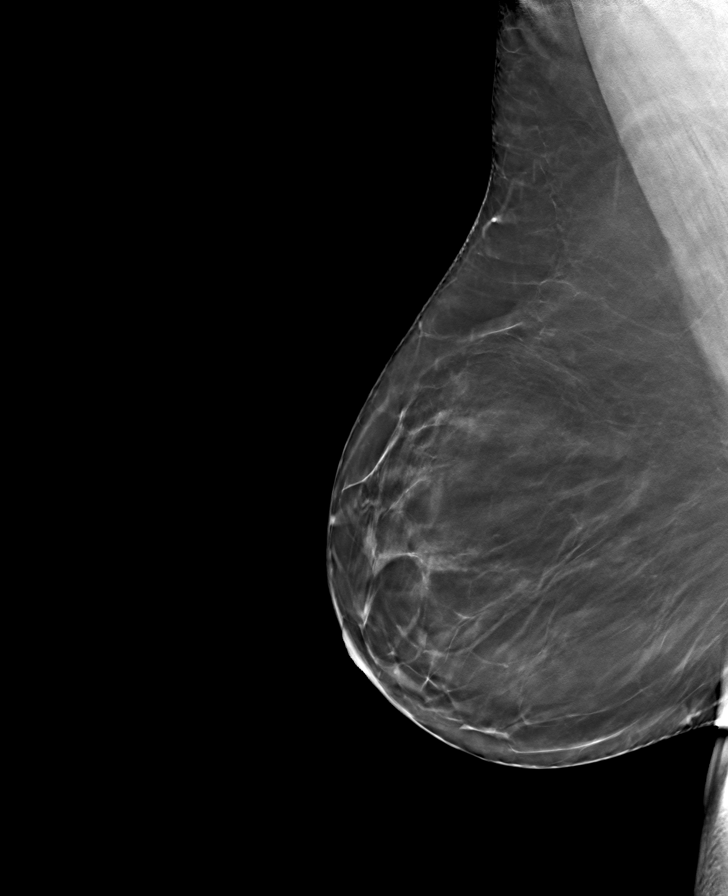

[R CC tomo · tomo slice 37/73.0]
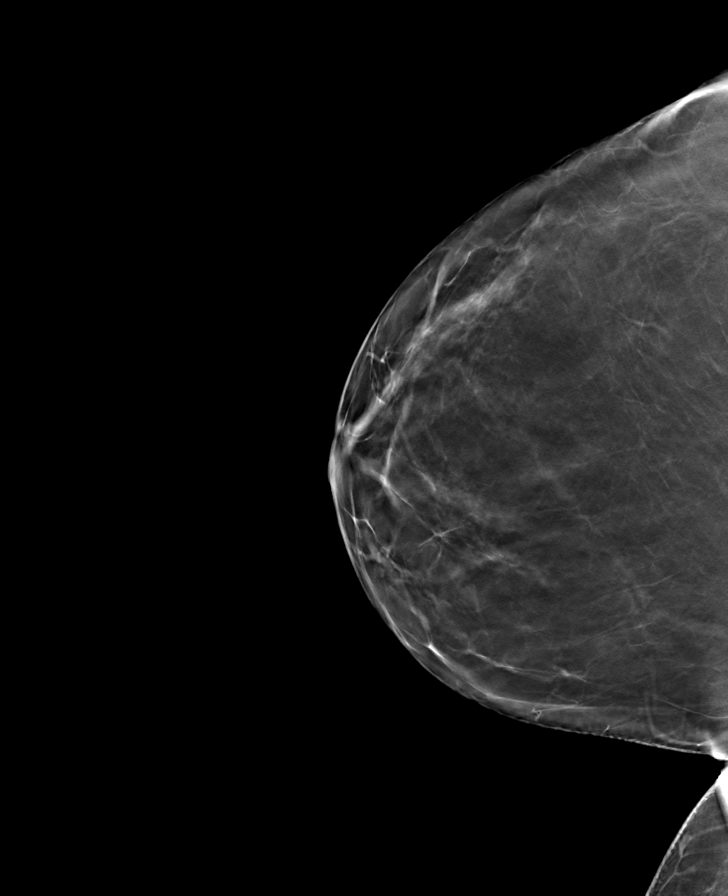

[8 of 24 positions shown; findings below may reference images not displayed]

ACR Breast Density Category b: There are scattered areas of
fibroglandular density.
FINDINGS: There are no findings suspicious for malignancy. Images were
processed with CAD.
IMPRESSION: No mammographic evidence of malignancy. A result letter of this
screening mammogram will be mailed directly to the patient.

RECOMMENDATION:
Screening mammogram in one year. (Code:[id number])

BI-RADS CATEGORY  1: Negative.

## 2021-03-27 ENCOUNTER — Other Ambulatory Visit (HOSPITAL_COMMUNITY)
Admission: RE | Admit: 2021-03-27 | Discharge: 2021-03-27 | Disposition: A | Discharge status: Home / Self Care | Payer: BC Managed Care – PPO | Source: Ambulatory Visit | Attending: Certified Nurse Midwife | Admitting: Certified Nurse Midwife

## 2021-03-27 ENCOUNTER — Other Ambulatory Visit: Payer: Self-pay

## 2021-03-27 ENCOUNTER — Encounter: Payer: Self-pay | Admitting: Certified Nurse Midwife

## 2021-03-27 ENCOUNTER — Ambulatory Visit (INDEPENDENT_AMBULATORY_CARE_PROVIDER_SITE_OTHER): Payer: BC Managed Care – PPO | Admitting: Certified Nurse Midwife

## 2021-03-27 VITALS — BP 117/81 | HR 79 | Resp 16 | Ht 65.0 in | Wt 243.9 lb

## 2021-03-27 DIAGNOSIS — Z6841 Body Mass Index (BMI) 40.0 and over, adult: Secondary | ICD-10-CM

## 2021-03-27 DIAGNOSIS — R87612 Low grade squamous intraepithelial lesion on cytologic smear of cervix (LGSIL): Secondary | ICD-10-CM | POA: Insufficient documentation

## 2021-03-27 DIAGNOSIS — R102 Pelvic and perineal pain unspecified side: Secondary | ICD-10-CM

## 2021-03-27 DIAGNOSIS — Z124 Encounter for screening for malignant neoplasm of cervix: Secondary | ICD-10-CM | POA: Insufficient documentation

## 2021-03-27 DIAGNOSIS — Z3041 Encounter for surveillance of contraceptive pills: Secondary | ICD-10-CM

## 2021-03-27 DIAGNOSIS — Z01419 Encounter for gynecological examination (general) (routine) without abnormal findings: Secondary | ICD-10-CM | POA: Diagnosis not present

## 2021-03-27 DIAGNOSIS — Z8619 Personal history of other infectious and parasitic diseases: Secondary | ICD-10-CM | POA: Diagnosis not present

## 2021-03-27 DIAGNOSIS — Z3200 Encounter for pregnancy test, result unknown: Secondary | ICD-10-CM

## 2021-03-27 DIAGNOSIS — Z3202 Encounter for pregnancy test, result negative: Secondary | ICD-10-CM | POA: Diagnosis not present

## 2021-03-27 DIAGNOSIS — E669 Obesity, unspecified: Secondary | ICD-10-CM

## 2021-03-27 LAB — POCT URINE PREGNANCY: Preg Test, Ur: NEGATIVE

## 2021-03-27 MED ORDER — NORGESTIM-ETH ESTRAD TRIPHASIC 0.18/0.215/0.25 MG-25 MCG PO TABS: 1.0000 | ORAL_TABLET | Freq: Every day | ORAL | 3 refills | Status: DC

## 2021-03-27 NOTE — Patient Instructions (Addendum)
Preventive Care 68-44 Years Old, Female Preventive care refers to lifestyle choices and visits with your health care provider that can promote health and wellness. This includes: A yearly physical exam. This is also called an annual wellness visit. Regular dental and eye exams. Immunizations. Screening for certain conditions. Healthy lifestyle choices, such as: Eating a healthy diet. Getting regular exercise. Not using drugs or products that contain nicotine and tobacco. Limiting alcohol use. What can I expect for my preventive care visit? Physical exam Your health care provider will check your: Height and weight. These may be used to calculate your BMI (body mass index). BMI is a measurement that tells if you are at a healthy weight. Heart rate and blood pressure. Body temperature. Skin for abnormal spots. Counseling Your health care provider may ask you questions about your: Past medical problems. Family's medical history. Alcohol, tobacco, and drug use. Emotional well-being. Home life and relationship well-being. Sexual activity. Diet, exercise, and sleep habits. Work and work Statistician. Access to firearms. Method of birth control. Menstrual cycle. Pregnancy history. What immunizations do I need?  Vaccines are usually given at various ages, according to a schedule. Your health care provider will recommend vaccines for you based on your age, medicalhistory, and lifestyle or other factors, such as travel or where you work. What tests do I need? Blood tests Lipid and cholesterol levels. These may be checked every 5 years, or more often if you are over 37 years old. Hepatitis C test. Hepatitis B test. Screening Lung cancer screening. You may have this screening every year starting at age 30 if you have a 30-pack-year history of smoking and currently smoke or have quit within the past 15 years. Colorectal cancer screening. All adults should have this screening starting at  age 23 and continuing until age 3. Your health care provider may recommend screening at age 88 if you are at increased risk. You will have tests every 1-10 years, depending on your results and the type of screening test. Diabetes screening. This is done by checking your blood sugar (glucose) after you have not eaten for a while (fasting). You may have this done every 1-3 years. Mammogram. This may be done every 1-2 years. Talk with your health care provider about when you should start having regular mammograms. This may depend on whether you have a family history of breast cancer. BRCA-related cancer screening. This may be done if you have a family history of breast, ovarian, tubal, or peritoneal cancers. Pelvic exam and Pap test. This may be done every 3 years starting at age 79. Starting at age 54, this may be done every 5 years if you have a Pap test in combination with an HPV test. Other tests STD (sexually transmitted disease) testing, if you are at risk. Bone density scan. This is done to screen for osteoporosis. You may have this scan if you are at high risk for osteoporosis. Talk with your health care provider about your test results, treatment options,and if necessary, the need for more tests. Follow these instructions at home: Eating and drinking  Eat a diet that includes fresh fruits and vegetables, whole grains, lean protein, and low-fat dairy products. Take vitamin and mineral supplements as recommended by your health care provider. Do not drink alcohol if: Your health care provider tells you not to drink. You are pregnant, may be pregnant, or are planning to become pregnant. If you drink alcohol: Limit how much you have to 0-1 drink a day. Be aware  of how much alcohol is in your drink. In the [place] one drink equals one 12 oz bottle of beer (355 mL), one 5 oz glass of wine (148 mL), or one 1 oz glass of hard liquor (44 mL).  Lifestyle Take daily care of your teeth and  gums. Brush your teeth every morning and night with fluoride toothpaste. Floss one time each day. Stay active. Exercise for at least 30 minutes 5 or more days each week. Do not use any products that contain nicotine or tobacco, such as cigarettes, e-cigarettes, and chewing tobacco. If you need help quitting, ask your health care provider. Do not use drugs. If you are sexually active, practice safe sex. Use a condom or other form of protection to prevent STIs (sexually transmitted infections). If you do not wish to become pregnant, use a form of birth control. If you plan to become pregnant, see your health care provider for a prepregnancy visit. If told by your health care provider, take low-dose aspirin daily starting at age 29. Find healthy ways to cope with stress, such as: Meditation, yoga, or listening to music. Journaling. Talking to a trusted person. Spending time with friends and family. Safety Always wear your seat belt while driving or riding in a vehicle. Do not drive: If you have been drinking alcohol. Do not ride with someone who has been drinking. When you are tired or distracted. While texting. Wear a helmet and other protective equipment during sports activities. If you have firearms in your house, make sure you follow all gun safety procedures. What's next? Visit your health care provider once a year for an annual wellness visit. Ask your health care provider how often you should have your eyes and teeth checked. Stay up to date on all vaccines. This information is not intended to replace advice given to you by your health care provider. Make sure you discuss any questions you have with your healthcare provider. Document Revised: 06/17/2020 Document Reviewed: 05/25/2018 Elsevier Patient Education  2022 [employer].

## 2021-03-27 NOTE — Progress Notes (Signed)
ANNUAL PREVENTATIVE CARE GYN  ENCOUNTER NOTE  Subjective:       Lisa Schroeder is a 45 y.o. G51P1001 female here for a routine annual gynecologic exam.  Current complaints: 1. Intermittent left "ovarian" pelvic pain for the last two (2) months; throbbing sensations 2. Needs Pap smear, history of abnormals 3. Wishes to restart OCP, newly separated from spouse and requests STI screening from PAP  Denies difficulty breathing or respiratory distress, chest pain, abdominal pain, excessive vaginal bleeding, dysuria, and leg pain or swelling.    Gynecologic History  Patient's last menstrual period was 03/05/2021. Period Cycle (Days): 28 Period Duration (Days): 4-5 Period Pattern: Regular Menstrual Flow: Light, Moderate Menstrual Control: Tampon Menstrual Control Change Freq (Hours): 6 Dysmenorrhea: (!) Mild Dysmenorrhea Symptoms: Cramping  Contraception: none  Last Pap: 12/2019. 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 Results were: BI-RADS 1  Obstetric History OB History  Gravida Para Term Preterm AB Living  1 1 1     1   SAB IAB Ectopic Multiple Live Births          1    # Outcome Date GA Lbr Len/2nd Weight Sex Delivery Anes PTL Lv  1 Term 12/23/05   7 lb 2 oz (3.232 kg) F Vag-Spont  N LIV    Past Medical History:  Diagnosis Date   Abnormal Pap smear of cervix    Depression    HPV in female     Past Surgical History:  Procedure Laterality Date   COLPOSCOPY     for many yrs per pt   NECK SURGERY Left    lumb notes     Current Outpatient Medications on File Prior to Visit  Medication Sig Dispense Refill   aspirin EC 81 MG tablet Take 1 tablet (81 mg total) by mouth daily. 300 tablet 2   lamoTRIgine (LAMICTAL) 100 MG tablet Take 2 tablets (200 mg total) by mouth daily. 180 tablet 1   levothyroxine (SYNTHROID) 50 MCG tablet Take by mouth.     LORazepam (ATIVAN) 1 MG tablet TAKE 1 TABLET BY MOUTH EVERY 8 HOURS AS NEEDED FOR  ANXIETY 30 tablet 0   Lurasidone HCl (LATUDA) 120 MG TABS Take 1 tablet (120 mg total) by mouth daily. TAKE 1 TABLET (80 MG TOTAL) BY MOUTH DAILY WITH BREAKFAST. 30 tablet 1   Vitamin D, Ergocalciferol, (DRISDOL) 1.25 MG (50000 UNIT) CAPS capsule TAKE 1 CAPSULE (50,000 UNITS TOTAL) BY MOUTH EVERY 7 (SEVEN) DAYS. 13 capsule 2   No current facility-administered medications on file prior to visit.    No Known Allergies  Social History   Socioeconomic History   Marital status: Divorced    Spouse name: Not on file   Number of children: 1   Years of education: Not on file   Highest education level: Not on file  Occupational History   Not on file  Tobacco Use   Smoking status: Never   Smokeless tobacco: Never  Vaping Use   Vaping Use: Every day  Substance and Sexual Activity   Alcohol use: Yes    Comment: rare   Drug use: No   Sexual activity: Yes    Birth control/protection: None  Other Topics Concern   Not on file  Social History Narrative   20 year old daughter    Social Determinants of Health   Financial Resource Strain: Not on file  Food Insecurity: Not on file  Transportation Needs: Not on file  Physical Activity:  Not on file  Stress: Not on file  Social Connections: Not on file  Intimate Partner Violence: Not on file    Family History  Problem Relation Age of Onset   Thyroid disease Mother    Diabetes Father    Heart disease Father    Diabetes Paternal Grandfather    Heart disease Paternal Grandfather     The following portions of the patient's history were reviewed and updated as appropriate: allergies, current medications, past family history, past medical history, past social history, past surgical history and problem list.  Review of Systems  ROS negative except as noted above. Information obtained from patient.    Objective:   BP 117/81   Pulse 79   Resp 16   Ht 5\' 5"  (1.651 m)   Wt 243 lb 14.4 oz (110.6 kg)   LMP 03/05/2021   BMI 40.59 kg/m    CONSTITUTIONAL: Well-developed, well-nourished female in no acute distress.   PSYCHIATRIC: Normal mood and affect. Normal behavior. Normal judgment and thought content.  NEUROLGIC: Alert and oriented to person, place, and time. Normal muscle tone coordination. No cranial nerve deficit noted.  HENT:  Normocephalic, atraumatic.  EYES: Conjunctivae and EOM are normal.  NECK: Normal range of motion, supple, no masses.  Normal thyroid.   SKIN: Skin is warm and dry. No rash noted. Not diaphoretic. No erythema. No pallor.  CARDIOVASCULAR: Normal heart rate noted, regular rhythm, no murmur.  RESPIRATORY: Clear to auscultation bilaterally. Effort and breath sounds normal, no problems with respiration noted.  BREASTS: Symmetric in size. No masses, skin changes, nipple drainage, or lymphadenopathy.  ABDOMEN: Soft, normal bowel sounds, no distention noted.  No tenderness, rebound or guarding.   PELVIC:  External Genitalia: Normal  Vagina: Normal  Cervix: Normal, Pap collected  Uterus: Normal  Adnexa: Normal  MUSCULOSKELETAL: Normal range of motion. No tenderness.  No cyanosis, clubbing, or edema.  2+ distal pulses.  LYMPHATIC: No Axillary, Supraclavicular, or Inguinal Adenopathy.  Assessment:   Annual gynecologic examination 45 y.o.  Contraception: OCP (estrogen/progesterone)  Obesity 3  Problem List Items Addressed This Visit   None Visit Diagnoses     Possible pregnancy, not yet confirmed    -  Primary   Relevant Orders   POCT urine pregnancy (Completed)   Well woman exam       Relevant Orders   Cytology - PAP   Amb Ref to Medical Weight Management   56 PELVIC COMPLETE WITH TRANSVAGINAL   History of HPV infection       Relevant Orders   Cytology - PAP   LGSIL on Pap smear of cervix       Relevant Orders   Cytology - PAP   Screening for cervical cancer       Relevant Orders   Cytology - PAP   Surveillance for birth control, oral contraceptives       BMI  40.0-44.9, adult (HCC)       Relevant Orders   Amb Ref to Medical Weight Management   Pelvic pain       Relevant Orders   07-10-1982 PELVIC COMPLETE WITH TRANSVAGINAL       Plan:   Pap: Pap Co Test  Mammogram: Ordered by PCP  Labs: Managed by PCP  Routine preventative health maintenance measures emphasized: Exercise/Diet/Weight control, Tobacco Warnings, Alcohol/Substance use risks, and Stress Management; see AVS  Rx OCP, see orders  Reviewed red flag symptoms and when to call  Return to Clinic - 1 Year  for [facility] or sooner if needed   Serafina Royals, CNM  [facility], [facility] 03/27/21 3:37 PM

## 2021-03-27 NOTE — Addendum Note (Signed)
Addended by: Shaune Spittle on: 03/27/2021 05:24 PM   Modules accepted: Orders

## 2021-04-03 LAB — CYTOLOGY - PAP
Comment: NEGATIVE
Comment: NEGATIVE
HPV 16: NEGATIVE
HPV 18 / 45: NEGATIVE
High risk HPV: POSITIVE — AB

## 2021-04-18 ENCOUNTER — Other Ambulatory Visit: Payer: Self-pay | Admitting: Psychiatry

## 2021-04-18 DIAGNOSIS — F319 Bipolar disorder, unspecified: Secondary | ICD-10-CM

## 2021-04-22 ENCOUNTER — Encounter: Payer: BC Managed Care – PPO | Admitting: Obstetrics and Gynecology

## 2021-05-01 ENCOUNTER — Other Ambulatory Visit: Payer: Self-pay

## 2021-05-01 ENCOUNTER — Other Ambulatory Visit (HOSPITAL_COMMUNITY): Payer: Self-pay | Admitting: Otolaryngology

## 2021-05-01 ENCOUNTER — Ambulatory Visit
Admission: RE | Admit: 2021-05-01 | Discharge: 2021-05-01 | Disposition: A | Discharge status: Home / Self Care | Payer: BC Managed Care – PPO | Source: Ambulatory Visit | Attending: Certified Nurse Midwife | Admitting: Certified Nurse Midwife

## 2021-05-01 ENCOUNTER — Other Ambulatory Visit: Payer: Self-pay | Admitting: Otolaryngology

## 2021-05-01 DIAGNOSIS — R221 Localized swelling, mass and lump, neck: Secondary | ICD-10-CM | POA: Insufficient documentation

## 2021-05-01 DIAGNOSIS — Z01419 Encounter for gynecological examination (general) (routine) without abnormal findings: Secondary | ICD-10-CM | POA: Diagnosis present

## 2021-05-01 DIAGNOSIS — R102 Pelvic and perineal pain: Secondary | ICD-10-CM | POA: Insufficient documentation

## 2021-05-08 ENCOUNTER — Telehealth: Payer: Self-pay | Admitting: Obstetrics and Gynecology

## 2021-05-08 NOTE — Telephone Encounter (Signed)
Please advise. Thanks Tonyetta Berko 

## 2021-05-08 NOTE — Telephone Encounter (Signed)
Previous Lisa Schroeder patient.  Patient is requesting a call back in regards to her [place].

## 2021-05-08 NOTE — Telephone Encounter (Signed)
Spoke to pt she was calling requesting results of her ultrasound. Pt was informed that her message would be sent to a provider so they could review the results. Pt stated voiced that she understood and requested information via mychart using small non medical terms.

## 2021-05-15 ENCOUNTER — Other Ambulatory Visit: Payer: Self-pay | Admitting: Psychiatry

## 2021-05-15 DIAGNOSIS — F319 Bipolar disorder, unspecified: Secondary | ICD-10-CM

## 2021-05-20 ENCOUNTER — Telehealth: Payer: BC Managed Care – PPO | Admitting: Psychiatry

## 2021-06-11 NOTE — Progress Notes (Deleted)
    GYNECOLOGY PROGRESS NOTE  Subjective:    Patient ID: Lisa Schroeder, female    DOB: 03/28/1976, 45 y.o.   MRN: [id number]  HPI  Patient is a 46 y.o. G42P1001 female who presents for Colposcopy.  {Common ambulatory SmartLinks:19316}  Review of Systems {ros; complete:30496}   Objective:   There were no vitals taken for this visit. There is no height or weight on file to calculate BMI. General appearance: {general exam:16600} Abdomen: {abdominal exam:16834} Pelvic: {pelvic exam:16852::"cervix normal in appearance","external genitalia normal","no adnexal masses or tenderness","no cervical motion tenderness","rectovaginal septum normal","uterus normal size, shape, and consistency","vagina normal without discharge"} Extremities: {extremity exam:5109} Neurologic: {neuro exam:17854}   Assessment:   No diagnosis found.   Plan:   There are no diagnoses linked to this encounter.    Hildred Laser, MD [facility]

## 2021-06-12 ENCOUNTER — Encounter: Payer: BC Managed Care – PPO | Admitting: Obstetrics and Gynecology

## 2021-07-09 NOTE — Progress Notes (Signed)
    GYNECOLOGY OFFICE COLPOSCOPY PROCEDURE NOTE  45 y.o. G1P1001 here for colposcopy for low-grade squamous intraepithelial neoplasia (LGSIL - encompassing HPV,mild dysplasia,CIN I) pap smear on 03/27/2021. Discussed role for HPV in cervical dysplasia, need for surveillance.  Patient with long history of mildly abnormal pap smears.  Last pap LGSIL, with CIN I noted on cervical biopsy on 02/2020.   Patient gave informed written consent, time out was performed.  Placed in lithotomy position. Cervix viewed with speculum and colposcope after application of acetic acid.   Colposcopy adequate? Yes  Nabothian cysts present HPV changes noted at 6 o'clock; corresponding biopsies obtained.  ECC specimen obtained. All specimens were labeled and sent to pathology.  Patient was given post procedure instructions.  Will follow up pathology and manage accordingly; patient will be contacted with results and recommendations.  Routine preventative health maintenance measures emphasized.   Hildred Laser, MD [facility]

## 2021-07-10 ENCOUNTER — Ambulatory Visit (INDEPENDENT_AMBULATORY_CARE_PROVIDER_SITE_OTHER): Payer: BC Managed Care – PPO | Admitting: Obstetrics and Gynecology

## 2021-07-10 ENCOUNTER — Other Ambulatory Visit: Payer: Self-pay

## 2021-07-10 ENCOUNTER — Encounter: Payer: Self-pay | Admitting: Obstetrics and Gynecology

## 2021-07-10 ENCOUNTER — Other Ambulatory Visit (HOSPITAL_COMMUNITY)
Admission: RE | Admit: 2021-07-10 | Discharge: 2021-07-10 | Disposition: A | Discharge status: Home / Self Care | Payer: BC Managed Care – PPO | Source: Ambulatory Visit | Attending: Obstetrics and Gynecology | Admitting: Obstetrics and Gynecology

## 2021-07-10 VITALS — BP 120/85 | HR 78 | Resp 16 | Ht 65.0 in | Wt 240.8 lb

## 2021-07-10 DIAGNOSIS — R87612 Low grade squamous intraepithelial lesion on cytologic smear of cervix (LGSIL): Secondary | ICD-10-CM | POA: Diagnosis not present

## 2021-07-10 DIAGNOSIS — N870 Mild cervical dysplasia: Secondary | ICD-10-CM

## 2021-07-10 NOTE — Patient Instructions (Signed)
Colposcopy, Care After This sheet gives you information about how to care for yourself after your procedure. Your health care provider may also give you more specific instructions. If you have problems or questions, contact your health care provider. What can I expect after the procedure? If you had a colposcopy without a biopsy, you can expect to feel fine right away after your procedure. However, you may have some spotting of blood for a few days. You can return to your normal activities. If you had a colposcopy with a biopsy, it is common after the procedure to have: Soreness and mild pain. These may last for a few days. Light-headedness. Mild vaginal bleeding or discharge that is dark-colored and grainy. This may last for a few days. The discharge may be caused by a liquid (solution) that was used during the procedure. You may need to wear a sanitary pad during this time. Spotting of blood for at least 48 hours after the procedure. Follow these instructions at home: Medicines Take over-the-counter and prescription medicines only as told by your health care provider. Talk with your health care provider about what type of over-the-counter pain medicine and prescription medicine you can start to take again. It is especially important to talk with your health care provider if you take blood thinners. Activity Limit your physical activity for the first day after your procedure as told by your health care provider. Avoid using douche products, using tampons, or having sex for at least 3 days after the procedure or for as long as told. Return to your normal activities as told by your health care provider. Ask your health care provider what activities are safe for you. General instructions  Drink enough fluid to keep your urine pale yellow. Ask your health care provider if you may take baths, swim, or use a hot tub. You may take showers. If you use birth control (contraception), continue to use  it. Keep all follow-up visits as told by your health care provider. This is important. Contact a health care provider if: You develop a skin rash. Get help right away if: You bleed a lot from your vagina or pass blood clots. This includes using more than one sanitary pad each hour for 2 hours in a row. You have a fever or chills. You have vaginal discharge that is abnormal, is yellow in color, or smells bad. This could be a sign of infection. You have severe pain or cramps in your lower abdomen that do not go away with medicine. You faint. Summary If you had a colposcopy without a biopsy, you can expect to feel fine right away, but you may have some spotting of blood for a few days. You can return to your normal activities. If you had a colposcopy with a biopsy, it is common to have mild pain for a few days and spotting for 48 hours after the procedure. Avoid using douche products, using tampons, and having sex for at least 3 days after the procedure or for as long as told by your health care provider. Get help right away if you have heavy bleeding, severe pain, or signs of infection. This information is not intended to replace advice given to you by your health care provider. Make sure you discuss any questions you have with your health care provider. Document Revised: 09/12/2019 Document Reviewed: 09/12/2019 Elsevier Patient Education  2022 [employer]  

## 2021-07-11 ENCOUNTER — Other Ambulatory Visit: Payer: Self-pay | Admitting: Psychiatry

## 2021-07-11 DIAGNOSIS — F319 Bipolar disorder, unspecified: Secondary | ICD-10-CM

## 2021-07-13 LAB — SURGICAL PATHOLOGY

## 2021-07-20 ENCOUNTER — Encounter: Payer: Self-pay | Admitting: Psychiatry

## 2021-07-20 ENCOUNTER — Telehealth (INDEPENDENT_AMBULATORY_CARE_PROVIDER_SITE_OTHER): Payer: BC Managed Care – PPO | Admitting: Psychiatry

## 2021-07-20 DIAGNOSIS — F4001 Agoraphobia with panic disorder: Secondary | ICD-10-CM

## 2021-07-20 DIAGNOSIS — F319 Bipolar disorder, unspecified: Secondary | ICD-10-CM | POA: Diagnosis not present

## 2021-07-20 DIAGNOSIS — F902 Attention-deficit hyperactivity disorder, combined type: Secondary | ICD-10-CM | POA: Diagnosis not present

## 2021-07-20 MED ORDER — LAMOTRIGINE 100 MG PO TABS: 200.0000 mg | ORAL_TABLET | Freq: Every day | ORAL | 1 refills | Status: DC

## 2021-07-20 MED ORDER — LATUDA 120 MG PO TABS: 120.0000 mg | ORAL_TABLET | Freq: Every day | ORAL | 0 refills | Status: DC

## 2021-07-20 MED ORDER — LORAZEPAM 1 MG PO TABS: 1.0000 mg | ORAL_TABLET | Freq: Three times a day (TID) | ORAL | 2 refills | Status: DC | PRN

## 2021-07-20 NOTE — Progress Notes (Signed)
Lisa Schroeder [id number] [birth year] 45 y.o.   Video Visit via My Chart  I connected with pt by My Chart and verified that I am speaking with the correct person using two identifiers.   I discussed the limitations, risks, security and privacy concerns of performing an evaluation and management service by My Chart  and the availability of in person appointments. I also discussed with the patient that there may be a patient responsible charge related to this service. The patient expressed understanding and agreed to proceed.  I discussed the assessment and treatment plan with the patient. The patient was provided an opportunity to ask questions and all were answered. The patient agreed with the plan and demonstrated an understanding of the instructions.   The patient was advised to call back or seek an in-person evaluation if the symptoms worsen or if the condition fails to improve as anticipated.  I provided 30 minutes of video time during this encounter.  The patient was located at home and the provider was located office. Session started 5:00 until 5:30 PM  Subjective:   Patient ID:  Lisa Schroeder is a 45 y.o. (DOB 1976/06/27) female.  Chief Complaint:  Chief Complaint  Patient presents with   Follow-up   ADHD   Bipolar I disorder    Anxiety    HPI Lisa Schroeder presents to the office today for follow-up of bipolar disorder and anxiety disorders.  visit June 2020.  She could not afford Metadate so she was given a prescription for Concerta 36 mg daily.  No other med changes.  She remained on Abilify 5 mg daily, lamotrigine 200 mg daily.   She called May 22, 2019 stating that she was having panic attacks and asked for prescription for Ativan.  It was okayed for her to use sparingly.   seen October 05, 2019.  She described a pattern of hypersexuality as well as some other manic symptoms that she and her therapist had discussed.  We decided to switch from Abilify to Depakote as a  mood stabilizer.   11/16/2019 appointment with the following noted: Did switch sx.  Episodes mania are gone but more depression.  Couple weeks ago didn't want to leave the house.  Abilify did better with the depression. No manic sx in the last 3 weeks.  No further hypersexuality resolved.  Sleeping well.  Some racing thoughts but better.  Not irritable..  Some anxiety situational.   Concerta affordable. 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   Doing well without major issues.    Last visit changed to Metadate CD and that worked well but over $100.  Better energy and focus and more productive.  Tolerated well.  Ran out.  Dose seems good. Does ok without stimulant but better focus and energy and happier on it.  Be nice if insurance would cover it. Plan:Unfortunately she's been more depressed and has a tremor. Increase lamotrigine to 150 mg daily to help with depression BC tremor reduce Depakote to 750 mg HS.  Disc risk return of mania. Follow-up 8 weeks.   March 28, 2020 appointment with the following noted Fine.  Hardly ever take Concerta bc it caused the tremor. Disc goal to get pregnant and need to get off Depakote. Rare panic.  Tolerates Ativan.  Ok off Concerta. Not depressed.  No manic behavior. Plan: Wean Depakote for pregnancy over 10 days.  Disc risk of this causing birth defects etc.  Disc  risk mood swings without it.  Call. First step would be to increase lamotrigine.   TC 06/06/20 Pt with rapid mood swings off Depakote.  Might be pregnant.  Needs another stabilizer.  Took Latuda before but stopped DT cost.  Agrees to restart bc it's one of safest in pregnancy.  Will send in RX for 40 mg Latuda.   07/02/2020 appointment with the following noted: Not pregnant.  "I feel 10 times better" in a few days.  Happier and more productive.  No crying anymore. No significant anger like before.  Sleep is good. Last Friday was dizzy and told she could be having SE.   He told her to cut Latuda to 20 and then increase it back to 40 mg.  Back on 40 mg for 4-5 days.   Plan: Off Depakote and on Latuda & lamotrigine  bc pursuing pregnancy and cannot have stability without medication.  10/01/2020 appointment with the following noted: Mood pretty stable except occ moodiness with PMS.  No sig depression.  Anxiety manageable with 2-3 Ativan including when flying.  No major problems.  No SE. Pursuing pregnancy.   Plan: no changes  02/19/21 appt noted: Recent anger outbursts a couple of times per week.  Including today at work which is unusual..  2 weeks depression and didn't want to get out of bed but also on menstrual cycle.  Lasted a day and resolved. Hungry lately.  No change in meds.  Energy seems down overall.  No particular trigger for these changes.  Says she can handle fatigue better than she can handle the mood swings. Plan: Because of recent mood swings increase Latuda from 40 mg to 60 mg daily.   07/20/2021 appointment with the following noted: Increase Latuda seemed to help with less emotional outbursts.  Manageable now.  Ativan helps if too stressed out. No SE. Consistent with Latuda 60 and Lamotrigine 200 mg daily.  Patient reports stable mood and denies depressed or irritable moods.  Patient denies any recent difficulty with anxiety.  Patient denies difficulty with sleep initiation or maintenance. Denies appetite disturbance. .  Patient denies any difficulty with concentration.  Patient denies any suicidal ideation.   1 D 45yo.  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   Past Psychiatric Medication Trials: Citalopram, venlafaxine, Wellbutrin,  lamotrigine 200, Latuda cost, Vraylar side effects, Abilify 5,   Rexulti Ritalin,    Review of Systems:  Review of Systems  Constitutional:  Positive for fatigue.  Cardiovascular:  Negative for chest pain.  Neurological:  Negative for tremors and weakness.   Medications: I have reviewed the patient's current  medications.  Current Outpatient Medications  Medication Sig Dispense Refill   levothyroxine (SYNTHROID) 50 MCG tablet Take by mouth.     Vitamin D, Ergocalciferol, (DRISDOL) 1.25 MG (50000 UNIT) CAPS capsule TAKE 1 CAPSULE (50,000 UNITS TOTAL) BY MOUTH EVERY 7 (SEVEN) DAYS. 13 capsule 2   aspirin EC 81 MG tablet Take 1 tablet (81 mg total) by mouth daily. (Patient not taking: Reported on 07/20/2021) 300 tablet 2   lamoTRIgine (LAMICTAL) 100 MG tablet Take 2 tablets (200 mg total) by mouth daily. 180 tablet 1   LORazepam (ATIVAN) 1 MG tablet Take 1 tablet (1 mg total) by mouth every 8 (eight) hours as needed. for anxiety 30 tablet 2   Lurasidone HCl (LATUDA) 120 MG TABS Take 1 tablet (120 mg total) by mouth daily at 12 noon. 90 tablet 0   No current facility-administered medications for this visit.  Medication Side Effects as noted  Allergies: No Known Allergies  Past Medical History:  Diagnosis Date   Abnormal Pap smear of cervix    Depression    HPV in female     Family History  Problem Relation Age of Onset   Thyroid disease Mother    Diabetes Father    Heart disease Father    Diabetes Paternal Grandfather    Heart disease Paternal Grandfather     Social History   Socioeconomic History   Marital status: Divorced    Spouse name: Not on file   Number of children: 1   Years of education: Not on file   Highest education level: Not on file  Occupational History   Not on file  Tobacco Use   Smoking status: Never   Smokeless tobacco: Never  Vaping Use   Vaping Use: Every day  Substance and Sexual Activity   Alcohol use: Yes    Comment: rare   Drug use: No   Sexual activity: Yes    Birth control/protection: None  Other Topics Concern   Not on file  Social History Narrative   37 year old daughter    Social Determinants of Health   Financial Resource Strain: Not on file  Food Insecurity: Not on file  Transportation Needs: Not on file  Physical Activity: Not  on file  Stress: Not on file  Social Connections: Not on file  Intimate Partner Violence: Not on file    Past Medical History, Surgical history, Social history, and Family history were reviewed and updated as appropriate.   Please see review of systems for further details on the patient's review from today.   Objective:   Physical Exam:  There were no vitals taken for this visit.  Physical Exam Neurological:     Mental Status: She is alert and oriented to person, place, and time.     Cranial Nerves: No dysarthria.  Psychiatric:        Attention and Perception: Attention and perception normal.        Mood and Affect: Mood is not anxious or depressed.        Speech: Speech normal.        Behavior: Behavior is cooperative.        Thought Content: Thought content normal. Thought content is not paranoid or delusional. Thought content does not include homicidal or suicidal ideation. Thought content does not include homicidal or suicidal plan.        Cognition and Memory: Cognition and memory normal.        Judgment: Judgment normal.     Comments: Insight intact More irritability and mood swings    Lab Review:     Component Value Date/Time   NA 139 12/26/2018 0145   NA 141 01/17/2017 1714   K 3.9 12/26/2018 0145   CL 106 12/26/2018 0145   CO2 20 (L) 12/26/2018 0145   GLUCOSE 141 (H) 12/26/2018 0145   BUN 11 12/26/2018 0145   BUN 10 01/17/2017 1714   CREATININE 0.71 12/26/2018 0145   CALCIUM 8.6 (L) 12/26/2018 0145   PROT 7.1 12/26/2018 0145   PROT 7.7 01/17/2017 1714   ALBUMIN 3.6 12/26/2018 0145   ALBUMIN 4.7 01/17/2017 1714   AST 19 12/26/2018 0145   ALT 20 12/26/2018 0145   ALKPHOS 65 12/26/2018 0145   BILITOT 0.3 12/26/2018 0145   BILITOT 0.3 01/17/2017 1714   GFRNONAA >60 12/26/2018 0145   GFRAA >60 12/26/2018 0145  Component Value Date/Time   WBC 12.4 (H) 12/26/2018 0145   RBC 4.63 12/26/2018 0145   HGB 12.6 12/26/2018 0145   HCT 38.1 12/26/2018 0145    PLT 310 12/26/2018 0145   MCV 82.3 12/26/2018 0145   MCH 27.2 12/26/2018 0145   MCHC 33.1 12/26/2018 0145   RDW 13.2 12/26/2018 0145    No results found for: POCLITH, LITHIUM   No results found for: PHENYTOIN, PHENOBARB, VALPROATE, CBMZ   .res Assessment: Plan:    Lisa Schroeder was seen today for follow-up, adhd, bipolar i disorder  and anxiety.  Diagnoses and all orders for this visit:  Bipolar I disorder (HCC) -     Lurasidone HCl (LATUDA) 120 MG TABS; Take 1 tablet (120 mg total) by mouth daily at 12 noon. -     lamoTRIgine (LAMICTAL) 100 MG tablet; Take 2 tablets (200 mg total) by mouth daily.  Panic disorder with agoraphobia -     LORazepam (ATIVAN) 1 MG tablet; Take 1 tablet (1 mg total) by mouth every 8 (eight) hours as needed. for anxiety  Attention deficit hyperactivity disorder (ADHD), combined type   Overall mood stability is a bit worse.  Patient has a history of good stability in mood with a combination of Depakote and Latuda.  Off Depakote and on Latuda bc pursuing pregnancy and cannot have stability without medication BC irritabilty.  Mood lability good with increase Latuda 60 mg and overall "way more stable" than with Depakote.  Extensive discussion with great detail surrounding her goal to get pregnant in the short-term and having bipolar disorder.  Discussed the risks with and without medication.   Lamotrigine is considered relatively safe for her.  Also discussed options like Latuda but cost was a barrier with that medication.  Discussed the fact that lamotrigine is less effective at preventing mania and some of the other mood stabilizers but should provide some benefit for that purpose.  She elected to continue lamotrigine.  Lisa Schroeder is now more affordable   Because of prior mood swings increased Latuda from 40 mg to 60 mg daily and she's markedly better.  Discussed the usual dosage range.  She is taking a relatively low dose to be the primary mood stabilizer. Discussed  potential metabolic side effects associated with atypical antipsychotics, as well as potential risk for movement side effects. Advised pt to contact office if movement side effects occur.   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  Okay as needed lorazepam for rare panic attacks.  Needs Ativan for trips.    Rec Choline in pursuit of pregnancy and disc 2 recent studies..   OK lorazepam rarely and not recommended when pregnant.  We discussed the short-term risks associated with benzodiazepines including sedation and increased fall risk among others.  Discussed long-term side effect risk including dependence, potential withdrawal symptoms, and the potential eventual dose-related risk of dementia.  Will not drive at destination.  Ativan 1 mg 1-2 before trip if needed for flying phobia Disc risk in pregnancy.  No med changes Continue Latuda 60 and lamotrigine 200 mg daily.   FU 6 mos   Lisa Staggers, MD, DFAPA Please see After Visit Summary for patient specific instructions.  No future appointments.   No orders of the defined types were placed in this encounter.   -------------------------------

## 2021-09-22 ENCOUNTER — Telehealth: Payer: Self-pay | Admitting: Obstetrics and Gynecology

## 2021-09-22 NOTE — Telephone Encounter (Signed)
Pt called stating that she thinks she is around 6 weeks- she called last week about slight cramping, she noticed some dark brown discharge when she wiped. Pt is wanting to know if this is something to be concerned about. Pt states still having the slight cramping. Please Advise.

## 2021-09-22 NOTE — Telephone Encounter (Signed)
Called patient to check on her and she has not had anymore brown discharge after wiping, she does still have some slight cramping. I advised patient to continue to monitor her cramping and if it worsened or changed to severe cramping with bleeding she should call us or go to ED for evaluation.

## 2021-09-27 NOTE — L&D Delivery Note (Signed)
Delivery Note   Lisa Schroeder is a 46 y.o. G2P1001 at [redacted]w[redacted]d Estimated Date of Delivery: 05/10/22  PRE-OPERATIVE DIAGNOSIS:  1) [redacted]w[redacted]d pregnancy.    POST-OPERATIVE DIAGNOSIS:  1) [redacted]w[redacted]d pregnancy s/p Vaginal, Spontaneous    Delivery Type: Vaginal, Spontaneous    Delivery Anesthesia: Epidural   Labor Complications:      ESTIMATED BLOOD LOSS: 1130  ml    FINDINGS:   1) female infant, "Iris," Apgar scores of 8   at 1 minute and 10   at 5 minutes and a birthweight of 7 lbs., 9 ounces.     SPECIMENS:   PLACENTA:   Appearance: Intact    Removal: Manual removal      Disposition:  Pathology  CORD BLOOD: Collected/Not Indicated  DISPOSITION:  Infant left in stable condition in the delivery room, with L&D personnel and mother.  NARRATIVE SUMMARY: Labor course:  Lisa Schroeder is a G2P1001 at [redacted]w[redacted]d who presented to Labor & Delivery for induction of labor. Her initial cervical exam was 2/50/-3. Labor proceeded with augmentation by misoprostol, Pitocin, and AROM, and she was found to be completely dilated at 1954. The second stage was notable for fetal tachycardia without maternal tachycardia or fever. With excellent maternal pushing effort, she birthed a viable female infant at 2107. The shoulders were birthed without difficulty. The infant was placed skin-to-skin with [facility]. The cord was doubly clamped and cut when pulsations ceased. The placenta did not deliver after 30 minutes despite Pitocin and gentle cord traction. Dr. Valentino Saxon was called to assist and manually removed the placenta (see note). A perineal and vaginal examination was performed. Lacerations: 2nd degree , left labial Lacerations were repaired with Vicryl suture using epidural anesthesia. The patient tolerated this well. Mother and baby were left in stable condition.   [name], CNM 05/11/2022 11:37 PM

## 2021-10-02 ENCOUNTER — Encounter: Payer: BC Managed Care – PPO | Admitting: Obstetrics and Gynecology

## 2021-10-06 ENCOUNTER — Encounter: Payer: Self-pay | Admitting: Obstetrics and Gynecology

## 2021-10-06 ENCOUNTER — Other Ambulatory Visit: Payer: Self-pay

## 2021-10-06 ENCOUNTER — Ambulatory Visit: Payer: BC Managed Care – PPO | Admitting: Obstetrics and Gynecology

## 2021-10-06 VITALS — BP 111/74 | HR 86 | Ht 65.0 in | Wt 241.0 lb

## 2021-10-06 DIAGNOSIS — Z3201 Encounter for pregnancy test, result positive: Secondary | ICD-10-CM

## 2021-10-06 DIAGNOSIS — O3680X0 Pregnancy with inconclusive fetal viability, not applicable or unspecified: Secondary | ICD-10-CM | POA: Diagnosis not present

## 2021-10-06 DIAGNOSIS — N911 Secondary amenorrhea: Secondary | ICD-10-CM

## 2021-10-06 DIAGNOSIS — O09521 Supervision of elderly multigravida, first trimester: Secondary | ICD-10-CM | POA: Diagnosis not present

## 2021-10-06 LAB — POCT URINE PREGNANCY: Preg Test, Ur: POSITIVE — AB

## 2021-10-06 NOTE — Progress Notes (Signed)
° °  GYNECOLOGY CLINIC PROGRESS NOTE Subjective:    Lisa Schroeder is a 46 y.o. G33P1001 female who presents for evaluation of amenorrhea. She believes she could be pregnant. Pregnancy is desired. Sexual Activity: single partner, contraception: none. Current symptoms also include: breast tenderness, fatigue, morning sickness, nausea, and positive home pregnancy test. Last period was normal.  Patient's last menstrual period was 08/03/2021.  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  Also reports that she had a dental procedure performed very early in pregnancy before she was aware.   The following portions of the patient's history were reviewed and updated as appropriate: allergies, current medications, past family history, past medical history, past social history, past surgical history, and problem list.  Review of Systems Pertinent items noted in HPI and remainder of comprehensive ROS otherwise negative.     Objective:    BP 111/74    Pulse 86    Ht 5\' 5"  (1.651 m)    Wt 241 lb (109.3 kg)    LMP 08/03/2021    SpO2 98%    BMI 40.10 kg/m  General: alert, no distress, and no acute distress    Lab Review Urine HCG: positive    Assessment:   1. Amenorrhea, secondary   2. Pregnancy test positive   3. Encounter to determine fetal viability of pregnancy, single or unspecified fetus   4. Multigravida of advanced maternal age in first trimester      Plan:   - Pregnancy Test: Positive: EDC: 05/10/2021, EGA 9.1 weeks by dates. Briefly discussed pre-natal care options. Encouraged well-balanced diet, plenty of rest when needed, pre-natal vitamins daily and walking for exercise. Discussed self-help for nausea, avoiding OTC medications until consulting provider or pharmacist, other than Tylenol as needed, minimal caffeine (1-2 cups daily) and avoiding alcohol. She will schedule her initial OB visit in the next month.  Viability scan and NOB  intake in 3 days. Feel free to call with any questions. - All general questions regarding pregnancy were answered.  - Encouraged genetic testing due to advanced maternal age.  - Advised on abdominal shielding, limiting X-ray exposure at work.   A total of 20 minutes were spent face-to-face with the patient during this encounter and over half of that time involved counseling and coordination of care.  05/12/2021, MD [facility]

## 2021-10-09 ENCOUNTER — Ambulatory Visit (INDEPENDENT_AMBULATORY_CARE_PROVIDER_SITE_OTHER): Payer: BC Managed Care – PPO | Admitting: Obstetrics and Gynecology

## 2021-10-09 ENCOUNTER — Other Ambulatory Visit: Payer: Self-pay

## 2021-10-09 ENCOUNTER — Ambulatory Visit (INDEPENDENT_AMBULATORY_CARE_PROVIDER_SITE_OTHER): Payer: BC Managed Care – PPO

## 2021-10-09 VITALS — BP 131/75 | HR 84 | Resp 16 | Ht 65.0 in | Wt 241.6 lb

## 2021-10-09 DIAGNOSIS — Z3481 Encounter for supervision of other normal pregnancy, first trimester: Secondary | ICD-10-CM

## 2021-10-09 DIAGNOSIS — Z0283 Encounter for blood-alcohol and blood-drug test: Secondary | ICD-10-CM | POA: Diagnosis not present

## 2021-10-09 DIAGNOSIS — Z3687 Encounter for antenatal screening for uncertain dates: Secondary | ICD-10-CM

## 2021-10-09 DIAGNOSIS — Z3A09 9 weeks gestation of pregnancy: Secondary | ICD-10-CM | POA: Diagnosis not present

## 2021-10-09 DIAGNOSIS — O99210 Obesity complicating pregnancy, unspecified trimester: Secondary | ICD-10-CM | POA: Diagnosis not present

## 2021-10-09 DIAGNOSIS — O3680X0 Pregnancy with inconclusive fetal viability, not applicable or unspecified: Secondary | ICD-10-CM

## 2021-10-09 DIAGNOSIS — Z113 Encounter for screening for infections with a predominantly sexual mode of transmission: Secondary | ICD-10-CM

## 2021-10-09 DIAGNOSIS — T7589XA Other specified effects of external causes, initial encounter: Secondary | ICD-10-CM

## 2021-10-09 NOTE — Progress Notes (Signed)
NOB INTAKE: Lisa Schroeder presents for NOB nurse interview visit. Pregnancy confirmation done 10/06/2021.  G2P1001. Pregnancy education material explained and given. One cat  in the home, she does not handle the cat litter her daughter takes care of that. NOB labs ordered. TSH/HbgA1c due to Increased BMI. Body mass index is 40.2 kg/m. HIV labs and Drug screen were explained optional and she did not decline. Drug screen ordered. PNV encouraged. Genetic screening options discussed. Genetic testing: Ordered.  Pt may discuss with provider.  Financial policy reviewed. FMLA forms reviewed and signed. Pt. To follow up with [facility] in 3 weeks for NOB physical.  All questions answered.

## 2021-10-09 NOTE — Patient Instructions (Incomplete)
First Trimester of Pregnancy °The first trimester of pregnancy starts on the first day of your last menstrual period until the end of week 12. This is also called months 1 through 3 of pregnancy. °Body changes during your first trimester °Your body goes through many changes during pregnancy. The changes usually return to normal after your baby is born. °Physical changes °You may gain or lose weight. °Your breasts may grow larger and hurt. The area around your nipples may get darker. °Dark spots or blotches may develop on your face. °You may have changes in your hair. °Health changes °You may feel like you might vomit (nauseous), and you may vomit. °You may have heartburn. °You may have headaches. °You may have trouble pooping (constipation). °Your gums may bleed. °Other changes °You may get tired easily. °You may pee (urinate) more often. °Your menstrual periods will stop. °You may not feel hungry. °You may want to eat certain kinds of food. °You may have changes in your emotions from day to day. °You may have more dreams. °Follow these instructions at home: °Medicines °Take over-the-counter and prescription medicines only as told by your doctor. Some medicines are not safe during pregnancy. °Take a prenatal vitamin that contains at least 600 micrograms (mcg) of folic acid. °Eating and drinking °Eat healthy meals that include: °Fresh fruits and vegetables. °Whole grains. °Good sources of protein, such as meat, eggs, or tofu. °Low-fat dairy products. °Avoid raw meat and unpasteurized juice, milk, and cheese. °If you feel like you may vomit, or you vomit: °Eat 4 or 5 small meals a day instead of 3 large meals. °Try eating a few soda crackers. °Drink liquids between meals instead of during meals. °You may need to take these actions to prevent or treat trouble pooping: °Drink enough fluids to keep your pee (urine) pale yellow. °Eat foods that are high in fiber. These include beans, whole grains, and fresh fruits and  vegetables. °Limit foods that are high in fat and sugar. These include fried or sweet foods. °Activity °Exercise only as told by your doctor. Most people can do their usual exercise routine during pregnancy. °Stop exercising if you have cramps or pain in your lower belly (abdomen) or low back. °Do not exercise if it is too hot or too humid, or if you are in a place of great height (high altitude). °Avoid heavy lifting. °If you choose to, you may have sex unless your doctor tells you not to. °Relieving pain and discomfort °Wear a good support bra if your breasts are sore. °Rest with your legs raised (elevated) if you have leg cramps or low back pain. °If you have bulging veins (varicose veins) in your legs: °Wear support hose as told by your doctor. °Raise your feet for 15 minutes, 3-4 times a day. °Limit salt in your food. °Safety °Wear your seat belt at all times when you are in a car. °Talk with your doctor if someone is hurting you or yelling at you. °Talk with your doctor if you are feeling sad or have thoughts of hurting yourself. °Lifestyle °Do not use hot tubs, steam rooms, or saunas. °Do not douche. Do not use tampons or scented sanitary pads. °Do not use herbal medicines, illegal drugs, or medicines that are not approved by your doctor. Do not drink alcohol. °Do not smoke or use any products that contain nicotine or tobacco. If you need help quitting, ask your doctor. °Avoid cat litter boxes and soil that is used by cats. These carry germs   that can cause harm to the baby and can cause a loss of your baby by miscarriage or stillbirth. °General instructions °Keep all follow-up visits. This is important. °Ask for help if you need counseling or if you need help with nutrition. Your doctor can give you advice or tell you where to go for help. °Visit your dentist. At home, brush your teeth with a soft toothbrush. Floss gently. °Write down your questions. Take them to your prenatal visits. °Where to find more  information °American Pregnancy Association: americanpregnancy.org °American College of Obstetricians and Gynecologists: www.acog.org °Office on Women's Health: womenshealth.gov/pregnancy °Contact a doctor if: °You are dizzy. °You have a fever. °You have mild cramps or pressure in your lower belly. °You have a nagging pain in your belly area. °You continue to feel like you may vomit, you vomit, or you have watery poop (diarrhea) for 24 hours or longer. °You have a bad-smelling fluid coming from your vagina. °You have pain when you pee. °You are exposed to a disease that spreads from person to person, such as chickenpox, measles, Zika virus, HIV, or hepatitis. °Get help right away if: °You have spotting or bleeding from your vagina. °You have very bad belly cramping or pain. °You have shortness of breath or chest pain. °You have any kind of injury, such as from a fall or a car crash. °You have new or increased pain, swelling, or redness in an arm or leg. °Summary °The first trimester of pregnancy starts on the first day of your last menstrual period until the end of week 12 (months 1 through 3). °Eat 4 or 5 small meals a day instead of 3 large meals. °Do not smoke or use any products that contain nicotine or tobacco. If you need help quitting, ask your doctor. °Keep all follow-up visits. °This information is not intended to replace advice given to you by your health care provider. Make sure you discuss any questions you have with your health care provider. °Document Revised: 02/20/2020 Document Reviewed: 12/27/2019 °Elsevier Patient Education © 2022 [employer] °Morning Sickness °Morning sickness is when you feel like you may vomit (feel nauseous) during pregnancy. Sometimes, you may vomit. Morning sickness most often happens in the morning, but it can also happen at any time of the day. Some women may have morning sickness that makes them vomit all the time. This is a more serious problem that needs  treatment. °What are the causes? °The cause of this condition is not known. °What increases the risk? °You had vomiting or a feeling like you may vomit before your pregnancy. °You had morning sickness in another pregnancy. °You are pregnant with more than one baby, such as twins. °What are the signs or symptoms? °Feeling like you may vomit. °Vomiting. °How is this treated? °Treatment is usually not needed for this condition. You may only need to change what you eat. In some cases, your doctor may give you some things to take for your condition. These include: °Vitamin B6 supplements. °Medicines to treat the feeling that you may vomit. °Ginger. °Follow these instructions at home: °Medicines °Take over-the-counter and prescription medicines only as told by your doctor. Do not take any medicines until you talk with your doctor about them first. °Take multivitamins before you get pregnant. These can stop or lessen the symptoms of morning sickness. °Eating and drinking °Eat dry toast or crackers before getting out of bed. °Eat 5 or 6 small meals a day. °Eat dry and bland foods like rice and   baked potatoes. °Do not eat greasy, fatty, or spicy foods. °Have someone cook for you if the smell of food causes you to vomit or to feel like you may vomit. °If you feel like you may vomit after taking prenatal vitamins, take them at night or with a snack. °Eat protein foods when you need a snack. Nuts, yogurt, and cheese are good choices. °Drink fluids throughout the day. °Try ginger ale made with real ginger, ginger tea made from fresh grated ginger, or ginger candies. °General instructions °Do not smoke or use any products that contain nicotine or tobacco. If you need help quitting, ask your doctor. °Use an air purifier to keep the air in your house free of smells. °Get lots of fresh air. °Try to avoid smells that make you feel sick. °Try wearing an acupressure wristband. This is a wristband that is used to treat seasickness. °Try  a treatment called acupuncture. In this treatment, a doctor puts needles into certain areas of your body to make you feel better. °Contact a doctor if: °You need medicine to feel better. °You feel dizzy or light-headed. °You are losing weight. °Get help right away if: °The feeling that you may vomit will not go away, or you cannot stop vomiting. °You faint. °You have very bad pain in your belly. °Summary °Morning sickness is when you feel like you may vomit (feel nauseous) during pregnancy. °You may feel sick in the morning, but you can feel this way at any time of the day. °Making some changes to what you eat may help your symptoms go away. °This information is not intended to replace advice given to you by your health care provider. Make sure you discuss any questions you have with your health care provider. °Document Revised: 04/28/2020 Document Reviewed: 04/07/2020 °Elsevier Patient Education © 2022 Elsevier Inc. °Commonly Asked Questions During Pregnancy ° °Cats: A parasite can be excreted in cat feces.  To avoid exposure you need to have another person empty the little box.  If you must empty the litter box you will need to wear gloves.  Wash your hands after handling your cat.  This parasite can also be found in raw or undercooked meat so this should also be avoided. ° °Colds, Sore Throats, Flu: Please check your medication sheet to see what you can take for symptoms.  If your symptoms are unrelieved by these medications please call the office. ° °Dental Work: Most any dental work your dentist recommends is permitted.  X-rays should only be taken during the first trimester if absolutely necessary.  Your abdomen should be shielded with a lead apron during all x-rays.  Please notify your provider prior to receiving any x-rays.  Novocaine is fine; gas is not recommended.  If your dentist requires a note from us prior to dental work please call the office and we will provide one for you. ° °Exercise: Exercise is  an important part of staying healthy during your pregnancy.  You may continue most exercises you were accustomed to prior to pregnancy.  Later in your pregnancy you will most likely notice you have difficulty with activities requiring balance like riding a bicycle.  It is important that you listen to your body and avoid activities that put you at a higher risk of falling.  Adequate rest and staying well hydrated are a must!  If you have questions about the safety of specific activities ask your provider.   ° °Exposure to Children with illness: Try to avoid obvious   exposure; report any symptoms to us when noted,  If you have chicken pos, red measles or mumps, you should be immune to these diseases.   Please do not take any vaccines while pregnant unless you have checked with your OB provider. ° °Fetal Movement: After 28 weeks we recommend you do "kick counts" twice daily.  Lie or sit down in a calm quiet environment and count your baby movements "kicks".  You should feel your baby at least 10 times per hour.  If you have not felt 10 kicks within the first hour get up, walk around and have something sweet to eat or drink then repeat for an additional hour.  If count remains less than 10 per hour notify your provider. ° °Fumigating: Follow your pest control agent's advice as to how long to stay out of your home.  Ventilate the area well before re-entering. ° °Hemorrhoids:   Most over-the-counter preparations can be used during pregnancy.  Check your medication to see what is safe to use.  It is important to use a stool softener or fiber in your diet and to drink lots of liquids.  If hemorrhoids seem to be getting worse please call the office.  ° °Hot Tubs:  Hot tubs Jacuzzis and saunas are not recommended while pregnant.  These increase your internal body temperature and should be avoided. ° °Intercourse:  Sexual intercourse is safe during pregnancy as long as you are comfortable, unless otherwise advised by your  provider.  Spotting may occur after intercourse; report any bright red bleeding that is heavier than spotting. ° °Labor:  If you know that you are in labor, please go to the hospital.  If you are unsure, please call the office and let us help you decide what to do. ° °Lifting, straining, etc:  If your job requires heavy lifting or straining please check with your provider for any limitations.  Generally, you should not lift items heavier than that you can lift simply with your hands and arms (no back muscles) ° °Painting:  Paint fumes do not harm your pregnancy, but may make you ill and should be avoided if possible.  Latex or water based paints have less odor than oils.  Use adequate ventilation while painting. ° °Permanents & Hair Color:  Chemicals in hair dyes are not recommended as they cause increase hair dryness which can increase hair loss during pregnancy.  " Highlighting" and permanents are allowed.  Dye may be absorbed differently and permanents may not hold as well during pregnancy. ° °Sunbathing:  Use a sunscreen, as skin burns easily during pregnancy.  Drink plenty of fluids; avoid over heating. ° °Tanning Beds:  Because their possible side effects are still unknown, tanning beds are not recommended. ° °Ultrasound Scans:  Routine ultrasounds are performed at approximately 20 weeks.  You will be able to see your baby's general anatomy an if you would like to know the gender this can usually be determined as well.  If it is questionable when you conceived you may also receive an ultrasound early in your pregnancy for dating purposes.  Otherwise ultrasound exams are not routinely performed unless there is a medical necessity.  Although you can request a scan we ask that you pay for it when conducted because insurance does not cover " patient request" scans. ° °Work: If your pregnancy proceeds without complications you may work until your due date, unless your physician or employer advises  otherwise. ° °Round Ligament Pain/Pelvic Discomfort:  Sharp,   shooting pains not associated with bleeding are fairly common, usually occurring in the second trimester of pregnancy.  They tend to be worse when standing up or when you remain standing for long periods of time.  These are the result of pressure of certain pelvic ligaments called "round ligaments".  Rest, Tylenol and heat seem to be the most effective relief.  As the womb and fetus grow, they rise out of the pelvis and the discomfort improves.  Please notify the office if your pain seems different than that described.  It may represent a more serious condition. ° ° °

## 2021-10-10 LAB — URINALYSIS, ROUTINE W REFLEX MICROSCOPIC
Bilirubin, UA: NEGATIVE
Glucose, UA: NEGATIVE
Ketones, UA: NEGATIVE
Leukocytes,UA: NEGATIVE
Nitrite, UA: NEGATIVE
RBC, UA: NEGATIVE
Specific Gravity, UA: >=1.03 — AB (ref 1.005–1.030)
Urobilinogen, Ur: 0.2 mg/dL (ref 0.2–1.0)
pH, UA: 5.5 (ref 5.0–7.5)

## 2021-10-11 LAB — GC/CHLAMYDIA PROBE AMP
Chlamydia trachomatis, NAA: NEGATIVE
Neisseria Gonorrhoeae by PCR: NEGATIVE

## 2021-10-11 LAB — CULTURE, OB URINE

## 2021-10-11 LAB — URINE CULTURE, OB REFLEX

## 2021-10-12 ENCOUNTER — Encounter: Payer: Self-pay | Admitting: Obstetrics and Gynecology

## 2021-10-12 LAB — TOXOPLASMA ANTIBODIES- IGG AND  IGM
Toxoplasma Antibody- IgM: <3 AU/mL (ref 0.0–7.9)
Toxoplasma IgG Ratio: <3 IU/mL (ref 0.0–7.1)

## 2021-10-12 LAB — VIRAL HEPATITIS HBV, HCV
HCV Ab: <0.1 s/co ratio (ref 0.0–0.9)
Hep B Core Total Ab: NEGATIVE
Hep B Surface Ab, Qual: NONREACTIVE
Hepatitis B Surface Ag: NEGATIVE

## 2021-10-12 LAB — ANTIBODY SCREEN: Antibody Screen: NEGATIVE

## 2021-10-12 LAB — NICOTINE SCREEN, URINE: Cotinine Ql Scrn, Ur: NEGATIVE ng/mL

## 2021-10-12 LAB — PAIN MGT SCRN (14 DRUGS), UR
Amphetamine Scrn, Ur: NEGATIVE ng/mL
BARBITURATE SCREEN URINE: NEGATIVE ng/mL
BENZODIAZEPINE SCREEN, URINE: NEGATIVE ng/mL
Buprenorphine, Urine: NEGATIVE ng/mL
CANNABINOIDS UR QL SCN: NEGATIVE ng/mL
Cocaine (Metab) Scrn, Ur: NEGATIVE ng/mL
Creatinine(Crt), U: 152.5 mg/dL (ref 20.0–300.0)
Fentanyl, Urine: NEGATIVE pg/mL
Meperidine Screen, Urine: NEGATIVE ng/mL
Methadone Screen, Urine: NEGATIVE ng/mL
OXYCODONE+OXYMORPHONE UR QL SCN: NEGATIVE ng/mL
Opiate Scrn, Ur: NEGATIVE ng/mL
Ph of Urine: 5.7 (ref 4.5–8.9)
Phencyclidine Qn, Ur: NEGATIVE ng/mL
Propoxyphene Scrn, Ur: NEGATIVE ng/mL
Tramadol Screen, Urine: NEGATIVE ng/mL

## 2021-10-12 LAB — TSH: TSH: 3.87 u[IU]/mL (ref 0.450–4.500)

## 2021-10-12 LAB — HCV INTERPRETATION

## 2021-10-12 LAB — RUBELLA SCREEN: Rubella Antibodies, IGG: 1.57 index (ref >0.99)

## 2021-10-12 LAB — HEMOGLOBIN A1C
Est. average glucose Bld gHb Est-mCnc: 111 mg/dL
Hgb A1c MFr Bld: 5.5 % (ref 4.8–5.6)

## 2021-10-12 LAB — RPR: RPR Ser Ql: NONREACTIVE

## 2021-10-12 LAB — ABO AND RH: Rh Factor: POSITIVE

## 2021-10-12 LAB — PARVOVIRUS B19 ANTIBODY, IGG AND IGM
Parvovirus B19 IgG: 9 index — ABNORMAL HIGH (ref 0.0–0.8)
Parvovirus B19 IgM: 0.6 index (ref 0.0–0.8)

## 2021-10-12 LAB — VARICELLA ZOSTER ANTIBODY, IGG: Varicella zoster IgG: 666 index (ref >165)

## 2021-10-12 LAB — HIV ANTIBODY (ROUTINE TESTING W REFLEX): HIV Screen 4th Generation wRfx: NONREACTIVE

## 2021-10-15 ENCOUNTER — Encounter: Payer: Self-pay | Admitting: Obstetrics and Gynecology

## 2021-10-15 MED ORDER — CONCEPT DHA 53.5-38-1 MG PO CAPS: 1.0000 | ORAL_CAPSULE | Freq: Every day | ORAL | 3 refills | Status: DC

## 2021-10-28 ENCOUNTER — Encounter: Payer: Self-pay | Admitting: Obstetrics and Gynecology

## 2021-10-28 ENCOUNTER — Other Ambulatory Visit: Payer: Self-pay

## 2021-10-28 ENCOUNTER — Ambulatory Visit (INDEPENDENT_AMBULATORY_CARE_PROVIDER_SITE_OTHER): Payer: BC Managed Care – PPO | Admitting: Obstetrics and Gynecology

## 2021-10-28 VITALS — BP 131/79 | HR 88 | Ht 65.0 in | Wt 242.0 lb

## 2021-10-28 DIAGNOSIS — O210 Mild hyperemesis gravidarum: Secondary | ICD-10-CM

## 2021-10-28 DIAGNOSIS — Z3A12 12 weeks gestation of pregnancy: Secondary | ICD-10-CM

## 2021-10-28 DIAGNOSIS — O09511 Supervision of elderly primigravida, first trimester: Secondary | ICD-10-CM | POA: Insufficient documentation

## 2021-10-28 DIAGNOSIS — O99210 Obesity complicating pregnancy, unspecified trimester: Secondary | ICD-10-CM

## 2021-10-28 DIAGNOSIS — O09521 Supervision of elderly multigravida, first trimester: Secondary | ICD-10-CM

## 2021-10-28 DIAGNOSIS — Z8742 Personal history of other diseases of the female genital tract: Secondary | ICD-10-CM

## 2021-10-28 LAB — POCT URINALYSIS DIPSTICK OB
Bilirubin, UA: NEGATIVE
Blood, UA: NEGATIVE
Glucose, UA: NEGATIVE
Ketones, UA: NEGATIVE
Leukocytes, UA: NEGATIVE
Nitrite, UA: NEGATIVE
POC,PROTEIN,UA: NEGATIVE
Spec Grav, UA: 1.015 (ref 1.010–1.025)
Urobilinogen, UA: 0.2 E.U./dL
pH, UA: 7 (ref 5.0–8.0)

## 2021-10-28 MED ORDER — DOXYLAMINE-PYRIDOXINE 10-10 MG PO TBEC: 2.0000 | DELAYED_RELEASE_TABLET | Freq: Every day | ORAL | 5 refills | Status: DC

## 2021-10-28 NOTE — Progress Notes (Signed)
OBSTETRIC INITIAL PRENATAL VISIT  Subjective:    Lisa Schroeder is being seen today for her first obstetrical visit.  This is a planned pregnancy. She is a 46 y.o. G2P1001 female at [redacted]w[redacted]d gestation, Estimated Date of Delivery: 05/10/22 with Patient's last menstrual period was 08/03/2021.,  consistent with 9 week sono. Her obstetrical history is significant for advanced maternal age and obesity. Relationship with FOB: spouse, living together. Patient does intend to breast feed. Pregnancy history fully reviewed.    OB History  Gravida Para Term Preterm AB Living  2 1 1  0 0 1  SAB IAB Ectopic Multiple Live Births  0 0 0 0 1    # Outcome Date GA Lbr Len/2nd Weight Sex Delivery Anes PTL Lv  2 Current           1 Term 12/23/05   7 lb 2 oz (3.232 kg) F Vag-Spont  N LIV    Gynecologic History:  Last pap smear was 03/27/2021.  Results were  abnormal LGSIL .  H/o abnormal pap smears in the past.  Denies history of STIs.    Past Medical History:  Diagnosis Date   Abnormal Pap smear of cervix    Depression    HPV in female     Family History  Problem Relation Age of Onset   COPD Mother    Thyroid disease Mother    Pulmonary fibrosis Mother    Diabetes Father    Heart disease Father    Diabetes Paternal Grandfather    Heart disease Paternal Grandfather    Breast cancer Neg Hx    Cervical cancer Neg Hx    Colon cancer Neg Hx     Past Surgical History:  Procedure Laterality Date   COLPOSCOPY     for many yrs per pt    Social History   Socioeconomic History   Marital status: Married    Spouse name: Not on file   Number of children: 1   Years of education: Not on file   Highest education level: Not on file  Occupational History   Not on file  Tobacco Use   Smoking status: Former    Types: Cigarettes   Smokeless tobacco: Never  Vaping Use   Vaping Use: Former  Substance and Sexual Activity   Alcohol use: Yes    Comment: rare   Drug use: No    Sexual activity: Yes    Birth control/protection: None  Other Topics Concern   Not on file  Social History Narrative   39 year old daughter    Social Determinants of Health   Financial Resource Strain: Not on file  Food Insecurity: Not on file  Transportation Needs: Not on file  Physical Activity: Not on file  Stress: Not on file  Social Connections: Not on file  Intimate Partner Violence: Not on file    Current Outpatient Medications on File Prior to Visit  Medication Sig Dispense Refill   levothyroxine (SYNTHROID) 50 MCG tablet Take by mouth.     Lurasidone HCl (LATUDA) 120 MG TABS Take 1 tablet (120 mg total) by mouth daily at 12 noon. 90 tablet 0   Prenat-FeFum-FePo-FA-Omega 3 (CONCEPT DHA) 53.5-38-1 MG CAPS Take 1 capsule by mouth daily. 90 capsule 3   Vitamin D, Ergocalciferol, (DRISDOL) 1.25 MG (50000 UNIT) CAPS capsule TAKE 1 CAPSULE (50,000 UNITS TOTAL) BY MOUTH EVERY 7 (SEVEN) DAYS. 13 capsule 2   aspirin EC 81 MG tablet Take 1 tablet (81 mg  total) by mouth daily. (Patient not taking: Reported on 10/28/2021) 300 tablet 2   LORazepam (ATIVAN) 1 MG tablet Take 1 tablet (1 mg total) by mouth every 8 (eight) hours as needed. for anxiety (Patient not taking: Reported on 10/28/2021) 30 tablet 2   No current facility-administered medications on file prior to visit.    No Known Allergies   Review of Systems General: Not Present- Fever, Weight Loss and Weight Gain. Skin: Not Present- Rash. HEENT: Not Present- Blurred Vision, Headache and Bleeding Gums. Respiratory: Not Present- Difficulty Breathing. Breast: Not Present- Breast Mass. Cardiovascular: Not Present- Chest Pain, Elevated Blood Pressure, Fainting / Blacking Out and Shortness of Breath. Gastrointestinal: Not Present- Abdominal Pain, Constipation, Nausea and Vomiting. Female Genitourinary: Not Present- Frequency, Painful Urination, Pelvic Pain, Vaginal Bleeding, Vaginal Discharge, Contractions, regular, Fetal  Movements Decreased, Urinary Complaints and Vaginal Fluid. Musculoskeletal: Not Present- Back Pain and Leg Cramps. Neurological: Not Present- Dizziness. Psychiatric: Not Present- Depression.     Objective:   Blood pressure 131/79, pulse 88, weight 242 lb (109.8 kg), last menstrual period 08/03/2021.  Body mass index is 40.27 kg/m.  General Appearance:    Alert, cooperative, no distress, appears stated age  Head:    Normocephalic, without obvious abnormality, atraumatic  Eyes:    PERRL, conjunctiva/corneas clear, EOM's intact, both eyes  Ears:    Normal external ear canals, both ears  Nose:   Nares normal, septum midline, mucosa normal, no drainage or sinus tenderness  Throat:   Lips, mucosa, and tongue normal; teeth and gums normal  Neck:   Supple, symmetrical, trachea midline, no adenopathy; thyroid: no enlargement/tenderness/nodules; no carotid bruit or JVD  Back:     Symmetric, no curvature, ROM normal, no CVA tenderness  Lungs:     Clear to auscultation bilaterally, respirations unlabored  Chest Wall:    No tenderness or deformity   Heart:    Regular rate and rhythm, S1 and S2 normal, no murmur, rub or gallop  Breast Exam:    No tenderness, masses, or nipple abnormality  Abdomen:     Soft, non-tender, bowel sounds active all four quadrants, no masses, no organomegaly.  FHT 165 bpm (obtained by unofficial ultrasound).  Genitalia:    Pelvic:deferred.  Rectal:    Deferred   Extremities:   Extremities normal, atraumatic, no cyanosis or edema  Pulses:   2+ and symmetric all extremities  Skin:   Skin color, texture, turgor normal, no rashes or lesions  Lymph nodes:   Cervical, supraclavicular, and axillary nodes normal  Neurologic:   CNII-XII intact, normal strength, sensation and reflexes throughout     Assessment:   1. Supervision of high risk multigravida in first trimester in patient 35 years or older at time of delivery   2. [redacted] weeks gestation of pregnancy   3. History of  abnormal cervical Pap smear   4. Obesity in pregnancy   5. Morning sickness     Plan:   1. Supervision of high risk multigravida in first trimester in patient 35 years or older at time of delivery  - Initial labs reviewed. - Prenatal vitamins encouraged. - Problem list reviewed and updated. - New OB counseling:  The patient has been given an overview regarding routine prenatal care.   - Prenatal testing, optional genetic testing, and ultrasound use in pregnancy were reviewed.  Traditional genetic screening vs cell-fee DNA genetic screening discussed, including risks and benefits. Testing ordered ([facility]). - Benefits of Breast Feeding were discussed. The patient is  encouraged to consider nursing her baby post partum. - Will need to begin a daily baby aspirin, 81 mg.   2. History of abnormal cervical Pap smear - S/p colposcopy for July 2022 LGSIL pap smear. Can repeat pap towards the end of pregnancy or postpartum.   3. Obesity in pregnancy - Recommendations regarding diet, weight gain, and exercise in pregnancy were given.  4.   Morning sickness - Prescribed Diclegis for morning sickness.    Follow up in 4 weeks.    Hildred Laserherry, Tashunda Vandezande, MD [facility]

## 2021-10-30 ENCOUNTER — Encounter: Payer: BC Managed Care – PPO | Admitting: Obstetrics and Gynecology

## 2021-11-06 ENCOUNTER — Other Ambulatory Visit: Payer: Self-pay

## 2021-11-06 ENCOUNTER — Other Ambulatory Visit: Payer: BC Managed Care – PPO

## 2021-11-06 DIAGNOSIS — O09521 Supervision of elderly multigravida, first trimester: Secondary | ICD-10-CM

## 2021-11-07 LAB — CBC
Hematocrit: 36.4 % (ref 34.0–46.6)
Hemoglobin: 12.3 g/dL (ref 11.1–15.9)
MCH: 28 pg (ref 26.6–33.0)
MCHC: 33.8 g/dL (ref 31.5–35.7)
MCV: 83 fL (ref 79–97)
Platelets: 307 10*3/uL (ref 150–450)
RBC: 4.39 x10E6/uL (ref 3.77–5.28)
RDW: 13.8 % (ref 11.7–15.4)
WBC: 10.5 10*3/uL (ref 3.4–10.8)

## 2021-11-19 NOTE — Telephone Encounter (Signed)
Called patient. Informed her of genetic results. She verbalized understanding.

## 2021-11-24 ENCOUNTER — Encounter: Payer: BC Managed Care – PPO | Admitting: Obstetrics and Gynecology

## 2021-11-24 ENCOUNTER — Encounter: Payer: Self-pay | Admitting: Obstetrics and Gynecology

## 2021-11-27 ENCOUNTER — Encounter: Payer: BC Managed Care – PPO | Admitting: Obstetrics and Gynecology

## 2021-12-01 ENCOUNTER — Ambulatory Visit (INDEPENDENT_AMBULATORY_CARE_PROVIDER_SITE_OTHER): Payer: BC Managed Care – PPO | Admitting: Obstetrics and Gynecology

## 2021-12-01 ENCOUNTER — Encounter: Payer: Self-pay | Admitting: Obstetrics and Gynecology

## 2021-12-01 ENCOUNTER — Other Ambulatory Visit: Payer: Self-pay

## 2021-12-01 VITALS — BP 116/80 | HR 108 | Wt 243.5 lb

## 2021-12-01 DIAGNOSIS — Z3A17 17 weeks gestation of pregnancy: Secondary | ICD-10-CM

## 2021-12-01 DIAGNOSIS — Z1379 Encounter for other screening for genetic and chromosomal anomalies: Secondary | ICD-10-CM

## 2021-12-01 DIAGNOSIS — Z3482 Encounter for supervision of other normal pregnancy, second trimester: Secondary | ICD-10-CM

## 2021-12-01 LAB — POCT URINALYSIS DIPSTICK OB
Bilirubin, UA: NEGATIVE
Blood, UA: NEGATIVE
Glucose, UA: NEGATIVE
Ketones, UA: NEGATIVE
Leukocytes, UA: NEGATIVE
Nitrite, UA: NEGATIVE
POC,PROTEIN,UA: NEGATIVE
Spec Grav, UA: 1.01 (ref 1.010–1.025)
Urobilinogen, UA: 0.2 E.U./dL
pH, UA: 6 (ref 5.0–8.0)

## 2021-12-01 MED ORDER — PRENATAL/FOLIC ACID PO TABS: 1.0000 | ORAL_TABLET | Freq: Every day | ORAL | 3 refills | Status: DC

## 2021-12-01 NOTE — Progress Notes (Signed)
Lisa Schroeder. Patient states occasional morning sickness, otherwise doing well. AFP ordered. Patient states no questions or concerns at this time.   ?

## 2021-12-01 NOTE — Progress Notes (Signed)
ROB: No complaints.  Patient will start baby aspirin as directed.  aFP today.  Anatomic survey next visit. ?

## 2021-12-03 LAB — AFP, SERUM, OPEN SPINA BIFIDA
AFP MoM: 0.94
AFP Value: 28.2 ng/mL
Gest. Age on Collection Date: 17.1 weeks
Maternal Age At EDD: 45 yr
OSBR Risk 1 IN: 10000
Test Results:: NEGATIVE
Weight: 243 [lb_av]

## 2021-12-16 ENCOUNTER — Encounter: Payer: Self-pay | Admitting: Obstetrics and Gynecology

## 2021-12-28 ENCOUNTER — Other Ambulatory Visit: Payer: BC Managed Care – PPO

## 2021-12-28 ENCOUNTER — Ambulatory Visit (INDEPENDENT_AMBULATORY_CARE_PROVIDER_SITE_OTHER): Payer: BC Managed Care – PPO

## 2021-12-28 DIAGNOSIS — Z3482 Encounter for supervision of other normal pregnancy, second trimester: Secondary | ICD-10-CM | POA: Diagnosis not present

## 2021-12-28 DIAGNOSIS — Z3A17 17 weeks gestation of pregnancy: Secondary | ICD-10-CM | POA: Diagnosis not present

## 2021-12-29 ENCOUNTER — Other Ambulatory Visit: Payer: BC Managed Care – PPO

## 2021-12-29 ENCOUNTER — Encounter: Payer: Self-pay | Admitting: Obstetrics and Gynecology

## 2021-12-29 ENCOUNTER — Ambulatory Visit (INDEPENDENT_AMBULATORY_CARE_PROVIDER_SITE_OTHER): Payer: BC Managed Care – PPO | Admitting: Obstetrics and Gynecology

## 2021-12-29 VITALS — BP 126/81 | HR 91 | Wt 246.6 lb

## 2021-12-29 DIAGNOSIS — Z3A21 21 weeks gestation of pregnancy: Secondary | ICD-10-CM

## 2021-12-29 DIAGNOSIS — O99352 Diseases of the nervous system complicating pregnancy, second trimester: Secondary | ICD-10-CM | POA: Insufficient documentation

## 2021-12-29 DIAGNOSIS — Z3482 Encounter for supervision of other normal pregnancy, second trimester: Secondary | ICD-10-CM

## 2021-12-29 DIAGNOSIS — G5600 Carpal tunnel syndrome, unspecified upper limb: Secondary | ICD-10-CM

## 2021-12-29 LAB — POCT URINALYSIS DIPSTICK OB
Bilirubin, UA: NEGATIVE
Blood, UA: NEGATIVE
Glucose, UA: NEGATIVE
Ketones, UA: NEGATIVE
Leukocytes, UA: NEGATIVE
Nitrite, UA: NEGATIVE
Spec Grav, UA: 1.025 (ref 1.010–1.025)
Urobilinogen, UA: 0.2 E.U./dL
pH, UA: 6 (ref 5.0–8.0)

## 2021-12-29 NOTE — Progress Notes (Signed)
ROB: Noting some carpal tunnel symptoms.  Advised on hand brace.   ?

## 2021-12-29 NOTE — Progress Notes (Signed)
She is doing well. Has some concerns about pregnancy carpal tunnel, she has swelling and numbness in her hands especially at night while sleeping. ?

## 2022-01-14 ENCOUNTER — Other Ambulatory Visit: Payer: Self-pay

## 2022-01-14 ENCOUNTER — Telehealth: Payer: Self-pay | Admitting: Obstetrics and Gynecology

## 2022-01-14 ENCOUNTER — Encounter: Payer: Self-pay | Admitting: Obstetrics and Gynecology

## 2022-01-14 ENCOUNTER — Observation Stay
Admission: EM | Admit: 2022-01-14 | Discharge: 2022-01-14 | Disposition: A | Discharge status: Home / Self Care | Payer: BC Managed Care – PPO | Attending: Obstetrics and Gynecology | Admitting: Obstetrics and Gynecology

## 2022-01-14 DIAGNOSIS — R1033 Periumbilical pain: Secondary | ICD-10-CM | POA: Insufficient documentation

## 2022-01-14 DIAGNOSIS — Z3490 Encounter for supervision of normal pregnancy, unspecified, unspecified trimester: Secondary | ICD-10-CM

## 2022-01-14 DIAGNOSIS — O26893 Other specified pregnancy related conditions, third trimester: Secondary | ICD-10-CM | POA: Diagnosis not present

## 2022-01-14 DIAGNOSIS — R109 Unspecified abdominal pain: Secondary | ICD-10-CM | POA: Diagnosis not present

## 2022-01-14 DIAGNOSIS — Z3A23 23 weeks gestation of pregnancy: Secondary | ICD-10-CM | POA: Diagnosis not present

## 2022-01-14 DIAGNOSIS — O26892 Other specified pregnancy related conditions, second trimester: Secondary | ICD-10-CM | POA: Diagnosis present

## 2022-01-14 NOTE — OB Triage Note (Signed)
Pt discharged home in stable condition per provider order. Discharge instructions reviewed and pt and significant other verbalize understanding.  ?

## 2022-01-14 NOTE — Telephone Encounter (Signed)
PT states she is unable to continue to reschedule her appts . Pt is upset she has had multi reschedules. PT states her employer has informed her it can cost her position at work. Mrs. Lisa Schroeder is only able to do Friday's at this time. Points occur if she is absent or takes additional time off. I placed her with Dr Marcelline Mates on May 5th in her 11:30 spot since Dr.Evans is not available for appointments on Friday's ?

## 2022-01-14 NOTE — OB Triage Note (Signed)
Pt is a G2P1 at [redacted]w[redacted]d presenting to L&D triage c/o a "sharp pain" in her abdomen to the left of her belly button. Pt is rating is a 6/10 and states she was awakened from her sleep. Pt states this pain "comes and goes about every hour." Pt denies LOF, vaginal bleeding. +FM. Pt denies sex in the past 48 hrs. Pt denies abnormal discharge. Pt denies burning and increased urgency, frequency to urinate. Pt denies constipation. VSS. Toco applied and assessing. FHT 145.  ?

## 2022-01-14 NOTE — Discharge Summary (Signed)
? ? ?  L&D OB Triage Note ? ?SUBJECTIVE ?Lisa Schroeder is a 46 y.o. G2P1001 female at [redacted]w[redacted]d, EDD Estimated Date of Delivery: 05/10/22 who presented to triage with complaints of intermittent sharp stabbing pain around her umbilicus..  Reports normal fetal movement, denies bleeding, denies other issues. ? ?OB History  ?Gravida Para Term Preterm AB Living  ?2 1 1  0 0 1  ?SAB IAB Ectopic Multiple Live Births  ?0 0 0 0 1  ?  ?# Outcome Date GA Lbr Len/2nd Weight Sex Delivery Anes PTL Lv  ?2 Current           ?1 Term 12/23/05   3232 g F Vag-Spont  N LIV  ? ? ?No medications prior to admission.  ? ? ? OBJECTIVE ? Nursing Evaluation: ?  BP 126/76 (BP Location: Left Arm)   Pulse 89   Temp 98.3 ?F (36.8 ?C) (Oral)   Resp 18   Ht 5\' 5"  (1.651 m)   Wt 112.9 kg   LMP 08/03/2021   BMI 41.44 kg/m?  ?  Findings:    ?    Patient not in labor -benign abdomen ?    No contractions noted ?    ? ?NST was performed and has been reviewed by me. ? ?NST INTERPRETATION: ?Appropriate for gestational age-fetal heart tones heard ? ?Mode: External ?Baseline Rate (A): 145 bpm (fht) ?  ?  ?  ?  ?  ?Contraction Frequency (min): none detected or palpated ? ?ASSESSMENT ?Impression:  ?1.  Pregnancy:  G2P1001 at [redacted]w[redacted]d , EDD Estimated Date of Delivery: 05/10/22 ?2.  Reassuring fetal and maternal status ? ? ?PLAN ?1. Current condition and above findings reviewed.  Reassuring fetal and maternal condition. ?2. Discharge home with standard labor precautions given to return to L&D or call the office for problems. ?3. Continue routine prenatal care. ?   ? ?

## 2022-01-17 ENCOUNTER — Other Ambulatory Visit: Payer: Self-pay | Admitting: Psychiatry

## 2022-01-17 DIAGNOSIS — F319 Bipolar disorder, unspecified: Secondary | ICD-10-CM

## 2022-01-17 NOTE — Telephone Encounter (Signed)
Call patient to verify dose, last note says 60 mg ?

## 2022-01-17 NOTE — Telephone Encounter (Signed)
Also needs to be changed to generic ?

## 2022-01-18 ENCOUNTER — Other Ambulatory Visit: Payer: Self-pay | Admitting: Psychiatry

## 2022-01-18 DIAGNOSIS — F319 Bipolar disorder, unspecified: Secondary | ICD-10-CM

## 2022-01-18 MED ORDER — LURASIDONE HCL 120 MG PO TABS: 120.0000 mg | ORAL_TABLET | Freq: Every day | ORAL | 0 refills | Status: DC

## 2022-01-28 ENCOUNTER — Encounter: Payer: BC Managed Care – PPO | Admitting: Obstetrics and Gynecology

## 2022-01-29 ENCOUNTER — Encounter: Payer: Self-pay | Admitting: Obstetrics and Gynecology

## 2022-01-29 ENCOUNTER — Encounter: Payer: BC Managed Care – PPO | Admitting: Obstetrics and Gynecology

## 2022-01-29 ENCOUNTER — Ambulatory Visit (INDEPENDENT_AMBULATORY_CARE_PROVIDER_SITE_OTHER): Payer: BC Managed Care – PPO | Admitting: Obstetrics and Gynecology

## 2022-01-29 VITALS — BP 132/78 | HR 87 | Wt 249.2 lb

## 2022-01-29 DIAGNOSIS — Z3A25 25 weeks gestation of pregnancy: Secondary | ICD-10-CM

## 2022-01-29 DIAGNOSIS — E039 Hypothyroidism, unspecified: Secondary | ICD-10-CM

## 2022-01-29 DIAGNOSIS — Z131 Encounter for screening for diabetes mellitus: Secondary | ICD-10-CM

## 2022-01-29 DIAGNOSIS — E559 Vitamin D deficiency, unspecified: Secondary | ICD-10-CM

## 2022-01-29 DIAGNOSIS — Z130 Encounter for screening for diseases of the blood and blood-forming organs and certain disorders involving the immune mechanism: Secondary | ICD-10-CM

## 2022-01-29 DIAGNOSIS — Z3482 Encounter for supervision of other normal pregnancy, second trimester: Secondary | ICD-10-CM

## 2022-01-29 LAB — POCT URINALYSIS DIPSTICK OB
Bilirubin, UA: NEGATIVE
Blood, UA: NEGATIVE
Glucose, UA: NEGATIVE
Ketones, UA: NEGATIVE
Leukocytes, UA: NEGATIVE
Nitrite, UA: NEGATIVE
POC,PROTEIN,UA: NEGATIVE
Spec Grav, UA: 1.01 (ref 1.010–1.025)
Urobilinogen, UA: 0.2 E.U./dL
pH, UA: 6.5 (ref 5.0–8.0)

## 2022-01-29 NOTE — Progress Notes (Signed)
ROB: She had some sharp abdominal pain that she was evaluated for at the ED, pain has now resolved. She wants a prescription for D2. ?

## 2022-01-29 NOTE — Patient Instructions (Signed)

## 2022-01-29 NOTE — Progress Notes (Signed)
ROB: Patient without complaints today. Notes she was on a "moms over 40" online group and many of them noted that they were induced around 38 weeks. Wonders if she should be considering induction.  I discussed with patient that current recommendations were for delivery in the 39th week due to slight increased risk of stillborn after 40 weeks, however if she declined an induction, would continue surveillance with fetal monitoring. Patient notes she is ok with induction. Advised on antenatal surveillance beginning at 36 weeks.  ? ?Patient also desires refill on her Vitamin D.  Just completed high dose regimen last week. Advised that her levels should be improved at this time. Can recheck levels at next visit along with 28 week labs to reassess if further treatment is needed. Also with h/o hypothyroidism, will check levels next visit. RTC in 4 weeks.  ?

## 2022-02-10 ENCOUNTER — Encounter: Payer: Self-pay | Admitting: Obstetrics and Gynecology

## 2022-02-11 ENCOUNTER — Ambulatory Visit: Payer: BC Managed Care – PPO | Admitting: Psychiatry

## 2022-02-11 ENCOUNTER — Encounter: Payer: Self-pay | Admitting: Psychiatry

## 2022-02-11 DIAGNOSIS — F319 Bipolar disorder, unspecified: Secondary | ICD-10-CM | POA: Diagnosis not present

## 2022-02-11 DIAGNOSIS — F4001 Agoraphobia with panic disorder: Secondary | ICD-10-CM | POA: Diagnosis not present

## 2022-02-11 DIAGNOSIS — F902 Attention-deficit hyperactivity disorder, combined type: Secondary | ICD-10-CM

## 2022-02-11 MED ORDER — LATUDA 120 MG PO TABS: 120.0000 mg | ORAL_TABLET | Freq: Every day | ORAL | 3 refills | Status: DC

## 2022-02-11 NOTE — Progress Notes (Signed)
Lisa Schroeder [id number] Sep 27, 1976 46 y.o.     Subjective:   Patient ID:  Lisa Schroeder is a 46 y.o. (DOB 01/11/1976) female.  Chief Complaint:  Chief Complaint  Patient presents with   Follow-up    HPI Aydia Maj Schroeder presents to the office today for follow-up of bipolar disorder and anxiety disorders.  visit June 2020.  She could not afford Metadate so she was given a prescription for Concerta 36 mg daily.  No other med changes.  She remained on Abilify 5 mg daily, lamotrigine 200 mg daily.   She called May 22, 2019 stating that she was having panic attacks and asked for prescription for Ativan.  It was okayed for her to use sparingly.   seen October 05, 2019.  She described a pattern of hypersexuality as well as some other manic symptoms that she and her therapist had discussed.  We decided to switch from Abilify to Depakote as a mood stabilizer.   11/16/2019 appointment with the following noted: Did switch sx.  Episodes mania are gone but more depression.  Couple weeks ago didn't want to leave the house.  Abilify did better with the depression. No manic sx in the last 3 weeks.  No further hypersexuality resolved.  Sleeping well.  Some racing thoughts but better.  Not irritable..  Some anxiety situational.   Concerta affordable. 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   Doing well without major issues.    Last visit changed to Metadate CD and that worked well but over $100.  Better energy and focus and more productive.  Tolerated well.  Ran out.  Dose seems good. Does ok without stimulant but better focus and energy and happier on it.  Be nice if insurance would cover it. Plan:Unfortunately she's been more depressed and has a tremor. Increase lamotrigine to 150 mg daily to help with depression BC tremor reduce Depakote to 750 mg HS.  Disc risk return of mania. Follow-up 8 weeks.   March 28, 2020 appointment with the  following noted Fine.  Hardly ever take Concerta bc it caused the tremor. Disc goal to get pregnant and need to get off Depakote. Rare panic.  Tolerates Ativan.  Ok off Concerta. Not depressed.  No manic behavior. Plan: Wean Depakote for pregnancy over 10 days.  Disc risk of this causing birth defects etc.  Disc risk mood swings without it.  Call. First step would be to increase lamotrigine.   TC 06/06/20 Pt with rapid mood swings off Depakote.  Might be pregnant.  Needs another stabilizer.  Took Latuda before but stopped DT cost.  Agrees to restart bc it's one of safest in pregnancy.  Will send in RX for 40 mg Latuda.   07/02/2020 appointment with the following noted: Not pregnant.  "I feel 10 times better" in a few days.  Happier and more productive.  No crying anymore. No significant anger like before.  Sleep is good. Last Friday was dizzy and told she could be having SE.  He told her to cut Latuda to 20 and then increase it back to 40 mg.  Back on 40 mg for 4-5 days.   Plan: Off Depakote and on Latuda & lamotrigine  bc pursuing pregnancy and cannot have stability without medication.  10/01/2020 appointment with the following noted: Mood pretty stable except occ moodiness with PMS.  No sig depression.  Anxiety manageable with 2-3 Ativan including when flying.  No major  problems.  No SE. Pursuing pregnancy.   Plan: no changes  02/19/21 appt noted: Recent anger outbursts a couple of times per week.  Including today at work which is unusual..  2 weeks depression and didn't want to get out of bed but also on menstrual cycle.  Lasted a day and resolved. Hungry lately.  No change in meds.  Energy seems down overall.  No particular trigger for these changes.  Says she can handle fatigue better than she can handle the mood swings. Plan: Because of recent mood swings increase Latuda from 40 mg to 60 mg daily.   07/20/2021 appointment with the following noted: Increase Latuda seemed to help with less  emotional outbursts.  Manageable now.  Ativan helps if too stressed out. No SE. Consistent with Latuda 60 and Lamotrigine 200 mg daily. Plan: No med changes Continue Latuda 60 and lamotrigine 200 mg daily.  02/11/22 appt noted: Pregnant [redacted] weeks.  [facility] 05/10/22. gIRL. Pregnancy going well.  WILL BREASTFEED 3 MOS. Mood pretty good.  Some outbursts of anger only around H and now separated and now is fine. Separated since early Feb. No anger otherwise. Separated in the past and had a few therapy sessions.  Difference of opinion and conflict over her 50 yo daughter. HX REALLY BAD POST-PARTUM DEPRESSION BUT NO HI OR SI AND HIT DAY AFTER SHE WAS BORN Patient reports stable mood and denies depressed or irritable moods.  Patient denies any recent difficulty with anxiety.  Patient denies difficulty with sleep initiation or maintenance. Denies appetite disturbance. .  Patient denies any difficulty with concentration.  Patient denies any suicidal ideation.   1 D 45yo.  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   Past Psychiatric Medication Trials: Citalopram, venlafaxine, Wellbutrin,  lamotrigine 200, Latuda cost, Vraylar side effects, Abilify 5,   Rexulti Ritalin,    Review of Systems:  Review of Systems  Constitutional:  Positive for fatigue.  Cardiovascular:  Negative for chest pain.  Neurological:  Negative for tremors.  Psychiatric/Behavioral:  Negative for dysphoric mood.    Medications: I have reviewed the patient's current medications.  Current Outpatient Medications  Medication Sig Dispense Refill   aspirin 81 MG chewable tablet Chew 81 mg by mouth daily.     lamoTRIgine (LAMICTAL) 100 MG tablet Take 200 mg by mouth daily.     Lurasidone HCl (LATUDA) 120 MG TABS Take 1 tablet (120 mg total) by mouth daily at 12 noon. 90 tablet 0   Omega-3 Fatty Acids (FISH OIL PO) Take by mouth.     Prenatal Vit-Fe Fumarate-FA (PRENATAL/FOLIC ACID) TABS Take 1 tablet by mouth daily. 30 tablet 3    levothyroxine (SYNTHROID) 50 MCG tablet Take by mouth.     No current facility-administered medications for this visit.    Medication Side Effects as noted  Allergies: No Known Allergies  Past Medical History:  Diagnosis Date   Abnormal Pap smear of cervix    Depression    HPV in female     Family History  Problem Relation Age of Onset   COPD Mother    Thyroid disease Mother    Pulmonary fibrosis Mother    Diabetes Father    Heart disease Father    Diabetes Paternal Grandfather    Heart disease Paternal Grandfather    Breast cancer Neg Hx    Cervical cancer Neg Hx    Colon cancer Neg Hx     Social History   Socioeconomic History   Marital status: Married  Spouse name: Casimiro NeedleMichael   Number of children: 1   Years of education: Not on file   Highest education level: Not on file  Occupational History   Not on file  Tobacco Use   Smoking status: Former    Types: Cigarettes   Smokeless tobacco: Never  Vaping Use   Vaping Use: Former  Substance and Sexual Activity   Alcohol use: Yes    Comment: rare   Drug use: No   Sexual activity: Yes    Birth control/protection: Surgical, Pill    Comment: Undecided  Other Topics Concern   Not on file  Social History Narrative   45 year old daughter    Social Determinants of Health   Financial Resource Strain: Not on file  Food Insecurity: Not on file  Transportation Needs: Not on file  Physical Activity: Not on file  Stress: Not on file  Social Connections: Not on file  Intimate Partner Violence: Not on file    Past Medical History, Surgical history, Social history, and Family history were reviewed and updated as appropriate.   Please see review of systems for further details on the patient's review from today.   Objective:   Physical Exam:  LMP 08/03/2021   Physical Exam Neurological:     Mental Status: She is alert and oriented to person, place, and time.     Cranial Nerves: No dysarthria.  Psychiatric:         Attention and Perception: Attention and perception normal.        Mood and Affect: Mood is not anxious or depressed.        Speech: Speech normal.        Behavior: Behavior is not agitated. Behavior is cooperative.        Thought Content: Thought content normal. Thought content is not paranoid or delusional. Thought content does not include homicidal or suicidal ideation. Thought content does not include suicidal plan.        Cognition and Memory: Cognition and memory normal.        Judgment: Judgment normal.     Comments: Insight intact     Lab Review:     Component Value Date/Time   NA 139 12/26/2018 0145   NA 141 01/17/2017 1714   K 3.9 12/26/2018 0145   CL 106 12/26/2018 0145   CO2 20 (L) 12/26/2018 0145   GLUCOSE 141 (H) 12/26/2018 0145   BUN 11 12/26/2018 0145   BUN 10 01/17/2017 1714   CREATININE 0.71 12/26/2018 0145   CALCIUM 8.6 (L) 12/26/2018 0145   PROT 7.1 12/26/2018 0145   PROT 7.7 01/17/2017 1714   ALBUMIN 3.6 12/26/2018 0145   ALBUMIN 4.7 01/17/2017 1714   AST 19 12/26/2018 0145   ALT 20 12/26/2018 0145   ALKPHOS 65 12/26/2018 0145   BILITOT 0.3 12/26/2018 0145   BILITOT 0.3 01/17/2017 1714   GFRNONAA >60 12/26/2018 0145   GFRAA >60 12/26/2018 0145       Component Value Date/Time   WBC 10.5 11/06/2021 0909   WBC 12.4 (H) 12/26/2018 0145   RBC 4.39 11/06/2021 0909   RBC 4.63 12/26/2018 0145   HGB 12.3 11/06/2021 0909   HCT 36.4 11/06/2021 0909   PLT 307 11/06/2021 0909   MCV 83 11/06/2021 0909   MCH 28.0 11/06/2021 0909   MCH 27.2 12/26/2018 0145   MCHC 33.8 11/06/2021 0909   MCHC 33.1 12/26/2018 0145   RDW 13.8 11/06/2021 0909    No results found  for: POCLITH, LITHIUM   No results found for: PHENYTOIN, PHENOBARB, VALPROATE, CBMZ   .res Assessment: Plan:    Randalyn was seen today for follow-up.  Diagnoses and all orders for this visit:  Bipolar I disorder (HCC)  Panic disorder with agoraphobia  Attention deficit hyperactivity  disorder (ADHD), combined type    Overall mood stability is a bit worse.  Patient has a history of good stability in mood with a combination of Depakote and Latuda.  Off Depakote and on Latuda bc pursuing pregnancy and cannot have stability without medication BC irritabilty.  Mood lability good with increase Latuda 60 mg and overall "way more stable" than with Depakote.  Extensive discussion with great detail surrounding her goal to get pregnant in the short-term and having bipolar disorder.  Discussed the risks with and without medication.   Lamotrigine is considered relatively safe for her.  Also discussed options like Latuda but cost was a barrier with that medication.  Discussed the fact that lamotrigine is less effective at preventing mania and some of the other mood stabilizers but should provide some benefit for that purpose.  She elected to continue lamotrigine.  Kasandra Knudsen is now more affordable   Because of prior mood swings increased Latuda from 40 mg to 60 mg daily and she's markedly better.  Discussed the usual dosage range.  She is taking a relatively low dose to be the primary mood stabilizer. Discussed potential metabolic side effects associated with atypical antipsychotics, as well as potential risk for movement side effects. Advised pt to contact office if movement side effects occur.   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  Okay as needed lorazepam for rare panic attacks.  Needs Ativan for trips.     OK lorazepam rarely and not recommended when pregnant.  We discussed the short-term risks associated with benzodiazepines including sedation and increased fall risk among others.  Discussed long-term side effect risk including dependence, potential withdrawal symptoms, and the potential eventual dose-related risk of dementia.  Will not drive at destination.  Ativan 1 mg 1-2 before trip if needed for flying phobia Disc risk in pregnancy.  BC prior serious post-partum depression disc risk again at length and what  to do.  No med changes Continue Latuda 60 and lamotrigine 200 mg daily. Wants Brand Latuda bc cheaper with discount card.   Supportive therapy around marital crisis and rec marital counseling.  She's not interested.  FU MID SEPT   Meredith Staggers, MD, DFAPA Please see After Visit Summary for patient specific instructions.  Future Appointments  Date Time Provider Department Center  02/26/2022  9:00 AM [facility] None  02/26/2022  9:30 AM Linzie Collin, MD [facility] None     No orders of the defined types were placed in this encounter.   -------------------------------

## 2022-02-17 ENCOUNTER — Telehealth: Payer: Self-pay | Admitting: Obstetrics and Gynecology

## 2022-02-17 NOTE — Telephone Encounter (Signed)
Pt appt for 06/08 was rescheduled due to MD's schedule change.PT received a VM notifying her of this change. Pt called upset.  Pt states " we change her appointments often and again she is unable to do another day other than Friday. Pt states "her job is at risk". I explained to the pt Dr. Logan Bores is no longer scheduled on Friday's in our office. I let the pt know I would reach out to Dr. Valentino Saxon and return her call. Left her a VM  letting the pt know I was working on it and would reach out to her today.

## 2022-02-17 NOTE — Telephone Encounter (Signed)
Please inform patient that I will see her on the Friday before or after (whichever Friday I am in the office).

## 2022-02-26 ENCOUNTER — Ambulatory Visit (INDEPENDENT_AMBULATORY_CARE_PROVIDER_SITE_OTHER): Payer: BC Managed Care – PPO | Admitting: Obstetrics and Gynecology

## 2022-02-26 ENCOUNTER — Other Ambulatory Visit: Payer: BC Managed Care – PPO

## 2022-02-26 ENCOUNTER — Encounter: Payer: BC Managed Care – PPO | Admitting: Obstetrics and Gynecology

## 2022-02-26 ENCOUNTER — Encounter: Payer: Self-pay | Admitting: Obstetrics and Gynecology

## 2022-02-26 VITALS — BP 133/76 | HR 91 | Wt 259.6 lb

## 2022-02-26 DIAGNOSIS — Z3482 Encounter for supervision of other normal pregnancy, second trimester: Secondary | ICD-10-CM

## 2022-02-26 DIAGNOSIS — Z3A29 29 weeks gestation of pregnancy: Secondary | ICD-10-CM

## 2022-02-26 DIAGNOSIS — Z130 Encounter for screening for diseases of the blood and blood-forming organs and certain disorders involving the immune mechanism: Secondary | ICD-10-CM

## 2022-02-26 DIAGNOSIS — Z23 Encounter for immunization: Secondary | ICD-10-CM | POA: Diagnosis not present

## 2022-02-26 DIAGNOSIS — O99213 Obesity complicating pregnancy, third trimester: Secondary | ICD-10-CM

## 2022-02-26 DIAGNOSIS — Z3483 Encounter for supervision of other normal pregnancy, third trimester: Secondary | ICD-10-CM

## 2022-02-26 DIAGNOSIS — E559 Vitamin D deficiency, unspecified: Secondary | ICD-10-CM

## 2022-02-26 DIAGNOSIS — Z131 Encounter for screening for diabetes mellitus: Secondary | ICD-10-CM

## 2022-02-26 DIAGNOSIS — E039 Hypothyroidism, unspecified: Secondary | ICD-10-CM

## 2022-02-26 LAB — POCT URINALYSIS DIPSTICK OB
Bilirubin, UA: NEGATIVE
Blood, UA: NEGATIVE
Glucose, UA: NEGATIVE
Ketones, UA: NEGATIVE
Leukocytes, UA: NEGATIVE
Nitrite, UA: NEGATIVE
Spec Grav, UA: 1.015 (ref 1.010–1.025)
Urobilinogen, UA: 0.2 E.U./dL
pH, UA: 7 (ref 5.0–8.0)

## 2022-02-26 NOTE — Progress Notes (Signed)
ROB: She is doing well today, no new concerns. TDAP and BTC form done today.

## 2022-02-26 NOTE — Progress Notes (Signed)
ROB: Patient doing well, no issues.  Have had previous Mychart discussions regarding her job and needing Friday appointments, discussed option of completing intermittent leave forms. Given option of pushing next visit out for 4 weeks (instead of 2 weeks) vs attempting to come on another day. Patient ok to stretch visit to 4 weeks. Will need growth scan at next visit for BMI.  For 28 week labs today.  Plans to breastfeed, desires  no further childbearing  (but unsure of method at this time).  For Tdap today, signed blood consent.  Repeat thyroid panel for h/o hypothyroidism. RTC in 4 weeks.

## 2022-02-27 LAB — CBC
Hematocrit: 35.2 % (ref 34.0–46.6)
Hemoglobin: 11.5 g/dL (ref 11.1–15.9)
MCH: 27.6 pg (ref 26.6–33.0)
MCHC: 32.7 g/dL (ref 31.5–35.7)
MCV: 85 fL (ref 79–97)
Platelets: 290 10*3/uL (ref 150–450)
RBC: 4.16 x10E6/uL (ref 3.77–5.28)
RDW: 14.3 % (ref 11.7–15.4)
WBC: 11.9 10*3/uL — ABNORMAL HIGH (ref 3.4–10.8)

## 2022-02-27 LAB — THYROID PANEL WITH TSH
Free Thyroxine Index: 0.8 — ABNORMAL LOW (ref 1.2–4.9)
T3 Uptake Ratio: 9 % — ABNORMAL LOW (ref 24–39)
T4, Total: 8.7 ug/dL (ref 4.5–12.0)
TSH: 1.06 u[IU]/mL (ref 0.450–4.500)

## 2022-02-27 LAB — VITAMIN D 25 HYDROXY (VIT D DEFICIENCY, FRACTURES): Vit D, 25-Hydroxy: 30.8 ng/mL (ref 30.0–100.0)

## 2022-02-27 LAB — RPR: RPR Ser Ql: NONREACTIVE

## 2022-02-27 LAB — GLUCOSE, 1 HOUR GESTATIONAL: Gestational Diabetes Screen: 122 mg/dL (ref 70–139)

## 2022-03-01 ENCOUNTER — Other Ambulatory Visit: Payer: Self-pay | Admitting: Obstetrics and Gynecology

## 2022-03-01 DIAGNOSIS — Z3A17 17 weeks gestation of pregnancy: Secondary | ICD-10-CM

## 2022-03-01 DIAGNOSIS — Z3482 Encounter for supervision of other normal pregnancy, second trimester: Secondary | ICD-10-CM

## 2022-03-03 ENCOUNTER — Other Ambulatory Visit: Payer: BC Managed Care – PPO

## 2022-03-03 ENCOUNTER — Encounter: Payer: BC Managed Care – PPO | Admitting: Obstetrics and Gynecology

## 2022-03-17 ENCOUNTER — Other Ambulatory Visit: Payer: Self-pay | Admitting: Psychiatry

## 2022-03-17 DIAGNOSIS — F319 Bipolar disorder, unspecified: Secondary | ICD-10-CM

## 2022-03-19 ENCOUNTER — Ambulatory Visit (INDEPENDENT_AMBULATORY_CARE_PROVIDER_SITE_OTHER): Payer: BC Managed Care – PPO

## 2022-03-19 DIAGNOSIS — E669 Obesity, unspecified: Secondary | ICD-10-CM | POA: Diagnosis not present

## 2022-03-19 DIAGNOSIS — Z3A32 32 weeks gestation of pregnancy: Secondary | ICD-10-CM | POA: Diagnosis not present

## 2022-03-19 DIAGNOSIS — O99213 Obesity complicating pregnancy, third trimester: Secondary | ICD-10-CM

## 2022-03-26 ENCOUNTER — Encounter: Payer: Self-pay | Admitting: Obstetrics and Gynecology

## 2022-03-26 ENCOUNTER — Ambulatory Visit (INDEPENDENT_AMBULATORY_CARE_PROVIDER_SITE_OTHER): Payer: BC Managed Care – PPO | Admitting: Obstetrics and Gynecology

## 2022-03-26 VITALS — BP 123/67 | HR 98 | Wt 255.5 lb

## 2022-03-26 DIAGNOSIS — R300 Dysuria: Secondary | ICD-10-CM

## 2022-03-26 DIAGNOSIS — O99213 Obesity complicating pregnancy, third trimester: Secondary | ICD-10-CM

## 2022-03-26 DIAGNOSIS — Z3A33 33 weeks gestation of pregnancy: Secondary | ICD-10-CM

## 2022-03-26 DIAGNOSIS — Z3483 Encounter for supervision of other normal pregnancy, third trimester: Secondary | ICD-10-CM

## 2022-03-26 DIAGNOSIS — O26893 Other specified pregnancy related conditions, third trimester: Secondary | ICD-10-CM

## 2022-03-26 DIAGNOSIS — E039 Hypothyroidism, unspecified: Secondary | ICD-10-CM

## 2022-03-26 LAB — POCT URINALYSIS DIPSTICK OB
Bilirubin, UA: NEGATIVE
Blood, UA: NEGATIVE
Glucose, UA: NEGATIVE
Ketones, UA: POSITIVE
Leukocytes, UA: NEGATIVE
Nitrite, UA: NEGATIVE
Spec Grav, UA: 1.015 (ref 1.010–1.025)
Urobilinogen, UA: 0.2 E.U./dL
pH, UA: 6 (ref 5.0–8.0)

## 2022-03-26 NOTE — Progress Notes (Signed)
ROB: Patient noted stools were bright green for the past 2 days, however resolved today.  Does note some UTI symptoms (dysuria, pressure). UA with ketones (patient notes her appetite has been less), encouraged to increase protein intake and increase hydration. Will send culture. RTC in 2 week. To meet midwives at next several visits. Recent growth scan at 35%ile. Repeat in 3-4 weeks. To begin NSTs at 36 weeks for BMI.

## 2022-03-26 NOTE — Progress Notes (Signed)
ROB: She thought maybe she had a UTI. She has some dysuria and pelvic pressure. Urinalysis was normal.

## 2022-03-28 LAB — URINE CULTURE

## 2022-04-01 ENCOUNTER — Encounter: Payer: BC Managed Care – PPO | Admitting: Obstetrics and Gynecology

## 2022-04-02 ENCOUNTER — Encounter: Payer: BC Managed Care – PPO | Admitting: Obstetrics and Gynecology

## 2022-04-02 ENCOUNTER — Other Ambulatory Visit: Payer: BC Managed Care – PPO

## 2022-04-07 ENCOUNTER — Other Ambulatory Visit: Payer: Self-pay | Admitting: Obstetrics and Gynecology

## 2022-04-07 DIAGNOSIS — O99213 Obesity complicating pregnancy, third trimester: Secondary | ICD-10-CM | POA: Insufficient documentation

## 2022-04-07 DIAGNOSIS — O09523 Supervision of elderly multigravida, third trimester: Secondary | ICD-10-CM | POA: Insufficient documentation

## 2022-04-07 DIAGNOSIS — E039 Hypothyroidism, unspecified: Secondary | ICD-10-CM | POA: Insufficient documentation

## 2022-04-09 ENCOUNTER — Encounter: Payer: BC Managed Care – PPO | Admitting: Obstetrics

## 2022-04-09 ENCOUNTER — Other Ambulatory Visit (HOSPITAL_COMMUNITY)
Admission: RE | Admit: 2022-04-09 | Discharge: 2022-04-09 | Disposition: A | Discharge status: Home / Self Care | Payer: BC Managed Care – PPO | Source: Ambulatory Visit | Attending: Obstetrics | Admitting: Obstetrics

## 2022-04-09 ENCOUNTER — Encounter: Payer: Self-pay | Admitting: Obstetrics

## 2022-04-09 ENCOUNTER — Ambulatory Visit (INDEPENDENT_AMBULATORY_CARE_PROVIDER_SITE_OTHER): Payer: BC Managed Care – PPO | Admitting: Obstetrics

## 2022-04-09 VITALS — BP 127/85 | HR 90 | Wt 260.3 lb

## 2022-04-09 DIAGNOSIS — Z113 Encounter for screening for infections with a predominantly sexual mode of transmission: Secondary | ICD-10-CM | POA: Insufficient documentation

## 2022-04-09 DIAGNOSIS — Z124 Encounter for screening for malignant neoplasm of cervix: Secondary | ICD-10-CM | POA: Diagnosis present

## 2022-04-09 DIAGNOSIS — Z3A35 35 weeks gestation of pregnancy: Secondary | ICD-10-CM

## 2022-04-09 LAB — POCT URINALYSIS DIPSTICK OB
Bilirubin, UA: NEGATIVE
Blood, UA: NEGATIVE
Glucose, UA: NEGATIVE
Ketones, UA: NEGATIVE
Leukocytes, UA: NEGATIVE
Nitrite, UA: NEGATIVE
POC,PROTEIN,UA: NEGATIVE
Spec Grav, UA: 1.015 (ref 1.010–1.025)
Urobilinogen, UA: 0.2 E.U./dL
pH, UA: 6 (ref 5.0–8.0)

## 2022-04-09 NOTE — Progress Notes (Signed)
ROB at [redacted]w[redacted]d. Active baby. Having some BH ctx. Denies LOF, vaginal bleeding. Leah and her partner are enjoying their childbirth class. Discussed indications for IOL, natural cervical ripening prior to IOL. Everything is ready at home, has car seat. Will take [facility] to [facility]. GBS, GC/chlamydia, and Pap done today. Reviewed s/s of labor. Has growth [place] next week. RTC in one week.   [name], CNM

## 2022-04-13 ENCOUNTER — Ambulatory Visit: Payer: BC Managed Care – PPO | Admitting: *Deleted

## 2022-04-13 ENCOUNTER — Ambulatory Visit: Payer: BC Managed Care – PPO | Attending: Obstetrics and Gynecology

## 2022-04-13 VITALS — BP 116/71 | HR 101

## 2022-04-13 DIAGNOSIS — Z3A36 36 weeks gestation of pregnancy: Secondary | ICD-10-CM

## 2022-04-13 DIAGNOSIS — O99213 Obesity complicating pregnancy, third trimester: Secondary | ICD-10-CM | POA: Diagnosis not present

## 2022-04-13 DIAGNOSIS — E039 Hypothyroidism, unspecified: Secondary | ICD-10-CM | POA: Insufficient documentation

## 2022-04-13 DIAGNOSIS — O09523 Supervision of elderly multigravida, third trimester: Secondary | ICD-10-CM

## 2022-04-13 DIAGNOSIS — E669 Obesity, unspecified: Secondary | ICD-10-CM

## 2022-04-13 DIAGNOSIS — O99283 Endocrine, nutritional and metabolic diseases complicating pregnancy, third trimester: Secondary | ICD-10-CM

## 2022-04-13 LAB — CERVICOVAGINAL ANCILLARY ONLY
Chlamydia: NEGATIVE
Comment: NEGATIVE
Comment: NORMAL
Neisseria Gonorrhea: NEGATIVE

## 2022-04-13 LAB — CULTURE, BETA STREP (GROUP B ONLY): Strep Gp B Culture: NEGATIVE

## 2022-04-14 ENCOUNTER — Ambulatory Visit (INDEPENDENT_AMBULATORY_CARE_PROVIDER_SITE_OTHER): Payer: BC Managed Care – PPO | Admitting: Certified Nurse Midwife

## 2022-04-14 VITALS — BP 125/83 | HR 102 | Wt 261.0 lb

## 2022-04-14 DIAGNOSIS — Z3A36 36 weeks gestation of pregnancy: Secondary | ICD-10-CM

## 2022-04-14 DIAGNOSIS — Z3483 Encounter for supervision of other normal pregnancy, third trimester: Secondary | ICD-10-CM

## 2022-04-14 NOTE — Progress Notes (Signed)
ROB doing well, feeling good movement. Pt state she had her u/s yesterday. Note from MFM states: Normal anatomy with measurements consistent with dates. Pt inquired about induction recommendations.  Discussed potential to wait until a little later in the 39th week if she is not favorable as long as antenatal testing remains reassuring , using natural methods to encourage labor ( herbal prep handout given) , discussed sweeping of membranes to encourage labor as well. She verbalizes and agrees. Follow up 1 wk with Dr. Valentino Saxon as scheduled for NST and ROB.   Doreene Burke, CNM

## 2022-04-14 NOTE — Patient Instructions (Signed)

## 2022-04-16 ENCOUNTER — Encounter: Payer: Self-pay | Admitting: Obstetrics

## 2022-04-16 LAB — CYTOLOGY - PAP
Comment: NEGATIVE
Comment: NEGATIVE
Comment: NEGATIVE
HPV 16: NEGATIVE
HPV 18 / 45: NEGATIVE
High risk HPV: POSITIVE — AB

## 2022-04-20 ENCOUNTER — Encounter: Payer: Self-pay | Admitting: Obstetrics and Gynecology

## 2022-04-20 ENCOUNTER — Ambulatory Visit (INDEPENDENT_AMBULATORY_CARE_PROVIDER_SITE_OTHER): Payer: BC Managed Care – PPO | Admitting: Obstetrics and Gynecology

## 2022-04-20 VITALS — BP 121/81 | HR 87 | Wt 265.6 lb

## 2022-04-20 DIAGNOSIS — Z3483 Encounter for supervision of other normal pregnancy, third trimester: Secondary | ICD-10-CM

## 2022-04-20 DIAGNOSIS — Z3A37 37 weeks gestation of pregnancy: Secondary | ICD-10-CM

## 2022-04-20 DIAGNOSIS — R87612 Low grade squamous intraepithelial lesion on cytologic smear of cervix (LGSIL): Secondary | ICD-10-CM

## 2022-04-20 DIAGNOSIS — O1203 Gestational edema, third trimester: Secondary | ICD-10-CM

## 2022-04-20 LAB — POCT URINALYSIS DIPSTICK OB
Bilirubin, UA: NEGATIVE
Blood, UA: NEGATIVE
Glucose, UA: NEGATIVE
Ketones, UA: NEGATIVE
Leukocytes, UA: NEGATIVE
Nitrite, UA: NEGATIVE
POC,PROTEIN,UA: NEGATIVE
Spec Grav, UA: 1.01 (ref 1.010–1.025)
Urobilinogen, UA: 0.2 E.U./dL
pH, UA: 6.5 (ref 5.0–8.0)

## 2022-04-20 NOTE — Progress Notes (Signed)
ROB: Reports hand and foot swelling in the mornings. Also noting some mild back pain and pelvic cramping. 36 week cultures negative. Repeat pap smear performed last visit due to h/o LSIL pap, again LSIL again. Will need colposcopy postpartum. Discussed plans for upcoming IOL, will schedule next visit. Patient desires to get as close as possible to her due date.

## 2022-04-30 ENCOUNTER — Encounter: Payer: Self-pay | Admitting: Obstetrics and Gynecology

## 2022-04-30 ENCOUNTER — Ambulatory Visit (INDEPENDENT_AMBULATORY_CARE_PROVIDER_SITE_OTHER): Payer: BC Managed Care – PPO | Admitting: Obstetrics and Gynecology

## 2022-04-30 VITALS — BP 128/88 | HR 98 | Wt 264.8 lb

## 2022-04-30 DIAGNOSIS — Z3A38 38 weeks gestation of pregnancy: Secondary | ICD-10-CM

## 2022-04-30 DIAGNOSIS — E039 Hypothyroidism, unspecified: Secondary | ICD-10-CM

## 2022-04-30 DIAGNOSIS — Z3483 Encounter for supervision of other normal pregnancy, third trimester: Secondary | ICD-10-CM | POA: Diagnosis not present

## 2022-04-30 DIAGNOSIS — O09523 Supervision of elderly multigravida, third trimester: Secondary | ICD-10-CM

## 2022-04-30 DIAGNOSIS — O99213 Obesity complicating pregnancy, third trimester: Secondary | ICD-10-CM

## 2022-04-30 LAB — POCT URINALYSIS DIPSTICK OB
Bilirubin, UA: NEGATIVE
Blood, UA: NEGATIVE
Glucose, UA: NEGATIVE
Ketones, UA: POSITIVE
Leukocytes, UA: NEGATIVE
Nitrite, UA: NEGATIVE
POC,PROTEIN,UA: NEGATIVE
Spec Grav, UA: 1.02 (ref 1.010–1.025)
Urobilinogen, UA: 0.2 E.U./dL
pH, UA: 6 (ref 5.0–8.0)

## 2022-04-30 NOTE — Progress Notes (Signed)
ROB: She is doing well. No new concerns. 

## 2022-04-30 NOTE — Progress Notes (Signed)
ROB: Notes  Braxton Hicks yesterday and today. Patient had episiotomy last pregnancy, wonders if she will have to have one again. Discussed that these are not routinely done, also discussed perineal massage. Reviewed plans for IOL, patient desires to be scheduled close to due date.  Will schedule for 05/11/2022 at midnight.   NST performed today was reviewed and was found to be reactive.  Continue recommended antenatal testing and prenatal care.RTC in 1 week.    NONSTRESS TEST INTERPRETATION  INDICATIONS: Obesity and  AMA  FHR baseline: 150 bpm RESULTS:Reactive COMMENTS: No contractions, uterine irritability present.    PLAN: 1. Continue fetal kick counts twice a day. 2. Continue antepartum testing as scheduled-weekly

## 2022-04-30 NOTE — Patient Instructions (Addendum)
COVID 19 Instructions for Scheduled Procedure (Inductions/C-sections and GYN surgeries)   Thank you for choosing [facility] for your services.  You have been scheduled for a procedure called ____Induction of Labor_____.    Your procedure is scheduled on _____8/15/2023 at midnight_______.  Upon your scheduled procedure date, you will need to arrive at the Emergency Room entrance.   Please arrive on time if you are scheduled for an induction of labor.   If you are scheduled for a Cesarean delivery or for Gyn Surgery, arrive 2 hours prior to your procedure time.   At this time, patients are allowed 2 support persons to accompany them. Face masks are no longer required for you and your support person(s). Your support person(s) are now allowed to be there with you during the entire time of your admission.   Please contact the office if you have any questions regarding this information.  The [facility] office number is [phone].     Thank you,    Your [facility] Providers      Labor Induction Labor induction is when steps are taken to cause a pregnant woman to begin the labor process. Most women go into labor on their own between 37 weeks and 42 weeks of pregnancy. When this does not happen, or when there is a medical need for labor to begin, steps may be taken to induce, or bring on, labor. Labor induction causes a pregnant woman's uterus to contract. It also causes the cervix to soften (ripen), open (dilate), and thin out. Usually, labor is not induced before 39 weeks of pregnancy unless there is a medical reason to do so. When is labor induction considered? Labor induction may be right for you if: Your pregnancy lasts longer than 41 to 42 weeks. Your placenta is separating from your uterus (placental abruption). You have a rupture of membranes and your labor does not begin. You have health problems, like diabetes or high blood pressure (preeclampsia) during your  pregnancy. Your baby has stopped growing or does not have enough amniotic fluid. Before labor induction begins, your health care provider will consider the following factors: Your medical condition and the baby's condition. How many weeks you have been pregnant. How mature the baby's lungs are. The condition of your cervix. The position of the baby. The size of your birth canal. Tell a health care provider about: Any allergies you have. All medicines you are taking, including vitamins, herbs, eye drops, creams, and over-the-counter medicines. Any problems you or your family members have had with anesthetic medicines. Any surgeries you have had. Any blood disorders you have. Any medical conditions you have. What are the risks? Generally, this is a safe procedure. However, problems may occur, including: Failed induction. Changes in fetal heart rate, such as being too high, too low, or irregular (erratic). Infection in the mother or the baby. Increased risk of having a cesarean delivery. Breaking off (abruption) of the placenta from the uterus. This is rare. Rupture of the uterus. This is very rare. Your baby could fail to get enough blood flow or oxygen. This can be life-threatening. When induction is needed for medical reasons, the benefits generally outweigh the risks. What happens during the procedure? During the procedure, your health care provider will use one of these methods to induce labor: Stripping the membranes. In this method, the amniotic sac tissue is gently separated from the cervix. This causes the following to happen: Your cervix stretches, which in turn causes the  release of prostaglandins. Prostaglandins induce labor and cause the uterus to contract. This procedure is often done in an office visit. You will be sent home to wait for contractions to begin. Prostaglandin medicine. This medicine starts contractions and causes the cervix to dilate and ripen. This can be  taken by mouth (orally) or by being inserted into the vagina (suppository). Inserting a small, thin tube (catheter) with a balloon into the vagina and then expanding the balloon with water to dilate the cervix. Breaking the water. In this method, a small instrument is used to make a small hole in the amniotic sac. This eventually causes the amniotic sac to break. Contractions should begin within a few hours. Medicine to trigger or strengthen contractions. This medicine is given through an IV that is inserted into a vein in your arm. This procedure may vary among health care providers and hospitals. Where to find more information March of Dimes: www.marchofdimes.org [employer]st[employer]ians and [employer]necologists: www.acog.org Summary Labor induction causes a pregnant woman's uterus to contract. It also causes the cervix to soften (ripen), open (dilate), and thin out. Labor is usually not induced before 39 weeks of pregnancy unless there is a medical reason to do so. When induction is needed for medical reasons, the benefits generally outweigh the risks. Talk with your health care provider about which methods of labor induction are right for you. This information is not intended to replace advice given to you by your health care provider. Make sure you discuss any questions you have with your health care provider. Document Revised: 06/26/2020 Document Reviewed: 06/26/2020 Elsevier Patient Education  2023 [employer].

## 2022-05-02 ENCOUNTER — Encounter: Payer: Self-pay | Admitting: Obstetrics and Gynecology

## 2022-05-06 ENCOUNTER — Ambulatory Visit: Payer: BC Managed Care – PPO

## 2022-05-06 ENCOUNTER — Encounter: Payer: Self-pay | Admitting: Obstetrics and Gynecology

## 2022-05-06 ENCOUNTER — Ambulatory Visit (INDEPENDENT_AMBULATORY_CARE_PROVIDER_SITE_OTHER): Payer: BC Managed Care – PPO | Admitting: Obstetrics and Gynecology

## 2022-05-06 VITALS — BP 127/72 | HR 99 | Wt 268.1 lb

## 2022-05-06 DIAGNOSIS — Z3403 Encounter for supervision of normal first pregnancy, third trimester: Secondary | ICD-10-CM

## 2022-05-06 DIAGNOSIS — Z3A39 39 weeks gestation of pregnancy: Secondary | ICD-10-CM | POA: Diagnosis not present

## 2022-05-06 DIAGNOSIS — O09523 Supervision of elderly multigravida, third trimester: Secondary | ICD-10-CM

## 2022-05-06 DIAGNOSIS — O99213 Obesity complicating pregnancy, third trimester: Secondary | ICD-10-CM | POA: Diagnosis not present

## 2022-05-06 DIAGNOSIS — O09511 Supervision of elderly primigravida, first trimester: Secondary | ICD-10-CM

## 2022-05-06 LAB — POCT URINALYSIS DIPSTICK OB
Bilirubin, UA: NEGATIVE
Blood, UA: NEGATIVE
Glucose, UA: NEGATIVE
Ketones, UA: NEGATIVE
Leukocytes, UA: NEGATIVE
Nitrite, UA: NEGATIVE
POC,PROTEIN,UA: NEGATIVE
Spec Grav, UA: 1.025 (ref 1.010–1.025)
Urobilinogen, UA: 0.2 E.U./dL
pH, UA: 6 (ref 5.0–8.0)

## 2022-05-06 NOTE — Progress Notes (Addendum)
ROB: Patient doing well, noting some Deberah Pelton. Desires membrane sweeping today.  Reviewed labor precautions. IOL scheduled for next Tuesday. NST performed today was reviewed and was found to be reactive.  Continue recommended antenatal testing and prenatal care.   NONSTRESS TEST INTERPRETATION  INDICATIONS: Obesity and  AMA  FHR baseline: 150 bpm RESULTS:Reactive COMMENTS: Increased fetal movement noted. Uterine irritability with Deberah Pelton.    PLAN: 1. Continue fetal kick counts twice a day. 2. IOL on 8/15.

## 2022-05-06 NOTE — Patient Instructions (Signed)
Labor Induction Labor induction is when steps are taken to cause a pregnant woman to begin the labor process. Most women go into labor on their own between 37 weeks and 42 weeks of pregnancy. When this does not happen, or when there is a medical need for labor to begin, steps may be taken to induce, or bring on, labor. Labor induction causes a pregnant woman's uterus to contract. It also causes the cervix to soften (ripen), open (dilate), and thin out. Usually, labor is not induced before 39 weeks of pregnancy unless there is a medical reason to do so. When is labor induction considered? Labor induction may be right for you if: Your pregnancy lasts longer than 41 to 42 weeks. Your placenta is separating from your uterus (placental abruption). You have a rupture of membranes and your labor does not begin. You have health problems, like diabetes or high blood pressure (preeclampsia) during your pregnancy. Your baby has stopped growing or does not have enough amniotic fluid. Before labor induction begins, your health care provider will consider the following factors: Your medical condition and the baby's condition. How many weeks you have been pregnant. How mature the baby's lungs are. The condition of your cervix. The position of the baby. The size of your birth canal. Tell a health care provider about: Any allergies you have. All medicines you are taking, including vitamins, herbs, eye drops, creams, and over-the-counter medicines. Any problems you or your family members have had with anesthetic medicines. Any surgeries you have had. Any blood disorders you have. Any medical conditions you have. What are the risks? Generally, this is a safe procedure. However, problems may occur, including: Failed induction. Changes in fetal heart rate, such as being too high, too low, or irregular (erratic). Infection in the mother or the baby. Increased risk of having a cesarean delivery. Breaking off  (abruption) of the placenta from the uterus. This is rare. Rupture of the uterus. This is very rare. Your baby could fail to get enough blood flow or oxygen. This can be life-threatening. When induction is needed for medical reasons, the benefits generally outweigh the risks. What happens during the procedure? During the procedure, your health care provider will use one of these methods to induce labor: Stripping the membranes. In this method, the amniotic sac tissue is gently separated from the cervix. This causes the following to happen: Your cervix stretches, which in turn causes the release of prostaglandins. Prostaglandins induce labor and cause the uterus to contract. This procedure is often done in an office visit. You will be sent home to wait for contractions to begin. Prostaglandin medicine. This medicine starts contractions and causes the cervix to dilate and ripen. This can be taken by mouth (orally) or by being inserted into the vagina (suppository). Inserting a small, thin tube (catheter) with a balloon into the vagina and then expanding the balloon with water to dilate the cervix. Breaking the water. In this method, a small instrument is used to make a small hole in the amniotic sac. This eventually causes the amniotic sac to break. Contractions should begin within a few hours. Medicine to trigger or strengthen contractions. This medicine is given through an IV that is inserted into a vein in your arm. This procedure may vary among health care providers and hospitals. Where to find more information March of D[employer]: www.marchofdimes.org The American College of Obstetricians and Gynecologists: www.acog.org Summary Labor induction causes a pregnant woman's uterus to contract. It also causes the cervix   to soften (ripen), open (dilate), and thin out. Labor is usually not induced before 39 weeks of pregnancy unless there is a medical reason to do so. When induction is needed for medical  reasons, the benefits generally outweigh the risks. Talk with your health care provider about which methods of labor induction are right for you. This information is not intended to replace advice given to you by your health care provider. Make sure you discuss any questions you have with your health care provider. Document Revised: 06/26/2020 Document Reviewed: 06/26/2020 Elsevier Patient Education  2023 [employer] Inc.  

## 2022-05-06 NOTE — Progress Notes (Signed)
Lisa Schroeder. Patient states Lisa Schroeder hicks contractions .Patient states no other questions or concerns at this time.

## 2022-05-10 ENCOUNTER — Encounter: Payer: Self-pay | Admitting: Obstetrics and Gynecology

## 2022-05-11 ENCOUNTER — Inpatient Hospital Stay: Payer: BC Managed Care – PPO | Admitting: Anesthesiology

## 2022-05-11 ENCOUNTER — Inpatient Hospital Stay
Admission: EM | Admit: 2022-05-11 | Discharge: 2022-05-13 | DRG: 806 | Disposition: A | Discharge status: Home / Self Care | Payer: BC Managed Care – PPO | Attending: Obstetrics and Gynecology | Admitting: Obstetrics and Gynecology

## 2022-05-11 ENCOUNTER — Encounter: Payer: Self-pay | Admitting: Obstetrics and Gynecology

## 2022-05-11 ENCOUNTER — Other Ambulatory Visit: Payer: Self-pay

## 2022-05-11 DIAGNOSIS — F319 Bipolar disorder, unspecified: Secondary | ICD-10-CM | POA: Diagnosis present

## 2022-05-11 DIAGNOSIS — Z3490 Encounter for supervision of normal pregnancy, unspecified, unspecified trimester: Principal | ICD-10-CM | POA: Diagnosis present

## 2022-05-11 DIAGNOSIS — O99344 Other mental disorders complicating childbirth: Secondary | ICD-10-CM | POA: Diagnosis present

## 2022-05-11 DIAGNOSIS — O480 Post-term pregnancy: Principal | ICD-10-CM | POA: Diagnosis present

## 2022-05-11 DIAGNOSIS — Z3A40 40 weeks gestation of pregnancy: Secondary | ICD-10-CM

## 2022-05-11 DIAGNOSIS — D62 Acute posthemorrhagic anemia: Secondary | ICD-10-CM | POA: Diagnosis not present

## 2022-05-11 DIAGNOSIS — O26893 Other specified pregnancy related conditions, third trimester: Secondary | ICD-10-CM | POA: Diagnosis present

## 2022-05-11 DIAGNOSIS — Z87891 Personal history of nicotine dependence: Secondary | ICD-10-CM | POA: Diagnosis not present

## 2022-05-11 DIAGNOSIS — O9081 Anemia of the puerperium: Secondary | ICD-10-CM | POA: Diagnosis not present

## 2022-05-11 DIAGNOSIS — O09523 Supervision of elderly multigravida, third trimester: Secondary | ICD-10-CM | POA: Diagnosis not present

## 2022-05-11 LAB — CBC
HCT: 36.5 % (ref 36.0–46.0)
Hemoglobin: 12.2 g/dL (ref 12.0–15.0)
MCH: 27.3 pg (ref 26.0–34.0)
MCHC: 33.4 g/dL (ref 30.0–36.0)
MCV: 81.7 fL (ref 80.0–100.0)
Platelets: 272 10*3/uL (ref 150–400)
RBC: 4.47 MIL/uL (ref 3.87–5.11)
RDW: 14.6 % (ref 11.5–15.5)
WBC: 12.9 10*3/uL — ABNORMAL HIGH (ref 4.0–10.5)
nRBC: 0 % (ref 0.0–0.2)

## 2022-05-11 LAB — TYPE AND SCREEN
ABO/RH(D): A POS
Antibody Screen: NEGATIVE

## 2022-05-11 LAB — RPR: RPR Ser Ql: NONREACTIVE

## 2022-05-11 LAB — ABO/RH: ABO/RH(D): A POS

## 2022-05-11 MED ORDER — FENTANYL-BUPIVACAINE-NACL 0.5-0.125-0.9 MG/250ML-% EP SOLN
12.0000 mL/h | EPIDURAL | Status: DC | PRN
  Administered 2022-05-11: 12 mL/h via EPIDURAL
  Filled 2022-05-11: qty 250

## 2022-05-11 MED ORDER — LURASIDONE HCL 80 MG PO TABS
120.0000 mg | ORAL_TABLET | Freq: Every day | ORAL | Status: DC
  Filled 2022-05-11: qty 1

## 2022-05-11 MED ORDER — IBUPROFEN 600 MG PO TABS
600.0000 mg | ORAL_TABLET | Freq: Four times a day (QID) | ORAL | Status: DC
  Administered 2022-05-12 – 2022-05-13 (×6): 600 mg via ORAL
  Filled 2022-05-11 (×6): qty 1

## 2022-05-11 MED ORDER — LIDOCAINE HCL (PF) 1 % IJ SOLN
30.0000 mL | INTRAMUSCULAR | Status: AC | PRN
Start: 2022-05-11 — End: 2022-05-11
  Administered 2022-05-11: 30 mL via SUBCUTANEOUS
  Filled 2022-05-11: qty 30

## 2022-05-11 MED ORDER — MISOPROSTOL 25 MCG QUARTER TABLET
25.0000 ug | ORAL_TABLET | Freq: Once | ORAL | Status: AC
Start: 2022-05-11 — End: 2022-05-11

## 2022-05-11 MED ORDER — BENZOCAINE-MENTHOL 20-0.5 % EX AERO: 1.0000 | INHALATION_SPRAY | CUTANEOUS | Status: DC | PRN

## 2022-05-11 MED ORDER — LEVOTHYROXINE SODIUM 50 MCG PO TABS
50.0000 ug | ORAL_TABLET | Freq: Every day | ORAL | Status: DC
  Filled 2022-05-11: qty 1

## 2022-05-11 MED ORDER — BUPIVACAINE HCL (PF) 0.25 % IJ SOLN
INTRAMUSCULAR | Status: DC | PRN
  Administered 2022-05-11: 3 mL via EPIDURAL
  Administered 2022-05-11: 2 mL via EPIDURAL

## 2022-05-11 MED ORDER — OXYTOCIN-SODIUM CHLORIDE 30-0.9 UT/500ML-% IV SOLN
1.0000 m[IU]/min | INTRAVENOUS | Status: DC
  Filled 2022-05-11: qty 500

## 2022-05-11 MED ORDER — OXYTOCIN BOLUS FROM INFUSION
333.0000 mL | Freq: Once | INTRAVENOUS | Status: AC
  Administered 2022-05-11: 333 mL via INTRAVENOUS

## 2022-05-11 MED ORDER — MISOPROSTOL 200 MCG PO TABS
ORAL_TABLET | ORAL | Status: AC
  Administered 2022-05-11: 25 ug via ORAL
  Filled 2022-05-11: qty 4

## 2022-05-11 MED ORDER — LIDOCAINE HCL (PF) 1 % IJ SOLN
INTRAMUSCULAR | Status: DC | PRN
  Administered 2022-05-11: 3 mL

## 2022-05-11 MED ORDER — LACTATED RINGERS IV SOLN: 500.0000 mL | Freq: Once | INTRAVENOUS | Status: DC

## 2022-05-11 MED ORDER — LACTATED RINGERS IV SOLN: INTRAVENOUS | Status: DC

## 2022-05-11 MED ORDER — MISOPROSTOL 25 MCG QUARTER TABLET
25.0000 ug | ORAL_TABLET | ORAL | Status: DC
  Administered 2022-05-11: 25 ug via VAGINAL
  Filled 2022-05-11: qty 1

## 2022-05-11 MED ORDER — FENTANYL CITRATE (PF) 100 MCG/2ML IJ SOLN: 50.0000 ug | INTRAMUSCULAR | Status: DC | PRN

## 2022-05-11 MED ORDER — ONDANSETRON HCL 4 MG/2ML IJ SOLN: 4.0000 mg | Freq: Four times a day (QID) | INTRAMUSCULAR | Status: DC | PRN

## 2022-05-11 MED ORDER — TERBUTALINE SULFATE 1 MG/ML IJ SOLN: 0.2500 mg | Freq: Once | INTRAMUSCULAR | Status: DC | PRN

## 2022-05-11 MED ORDER — ACETAMINOPHEN 325 MG PO TABS: 650.0000 mg | ORAL_TABLET | ORAL | Status: DC | PRN

## 2022-05-11 MED ORDER — OXYCODONE-ACETAMINOPHEN 5-325 MG PO TABS: 1.0000 | ORAL_TABLET | ORAL | Status: DC | PRN

## 2022-05-11 MED ORDER — OXYTOCIN-SODIUM CHLORIDE 30-0.9 UT/500ML-% IV SOLN
2.5000 [IU]/h | INTRAVENOUS | Status: DC
  Administered 2022-05-11: 2.5 [IU]/h via INTRAVENOUS
  Filled 2022-05-11: qty 500

## 2022-05-11 MED ORDER — LEVOTHYROXINE SODIUM 50 MCG PO TABS
50.0000 ug | ORAL_TABLET | Freq: Every day | ORAL | Status: DC
  Administered 2022-05-11: 50 ug via ORAL
  Filled 2022-05-11: qty 1

## 2022-05-11 MED ORDER — LIDOCAINE-EPINEPHRINE (PF) 1.5 %-1:200000 IJ SOLN
INTRAMUSCULAR | Status: DC | PRN
  Administered 2022-05-11: 3 mL via EPIDURAL

## 2022-05-11 MED ORDER — MISOPROSTOL 25 MCG QUARTER TABLET
ORAL_TABLET | ORAL | Status: AC
  Administered 2022-05-11: 25 ug via VAGINAL
  Filled 2022-05-11: qty 1

## 2022-05-11 MED ORDER — LURASIDONE HCL 40 MG PO TABS
120.0000 mg | ORAL_TABLET | Freq: Every day | ORAL | Status: DC
  Filled 2022-05-11: qty 3

## 2022-05-11 MED ORDER — OXYTOCIN-SODIUM CHLORIDE 30-0.9 UT/500ML-% IV SOLN
1.0000 m[IU]/min | INTRAVENOUS | Status: DC
  Administered 2022-05-11: 2 m[IU]/min via INTRAVENOUS

## 2022-05-11 MED ORDER — DIPHENHYDRAMINE HCL 50 MG/ML IJ SOLN: 12.5000 mg | INTRAMUSCULAR | Status: DC | PRN

## 2022-05-11 MED ORDER — SODIUM CHLORIDE 0.9 % IV SOLN: 2.0000 g | Freq: Once | INTRAVENOUS | Status: DC

## 2022-05-11 MED ORDER — EPHEDRINE 5 MG/ML INJ: 10.0000 mg | INTRAVENOUS | Status: DC | PRN

## 2022-05-11 MED ORDER — MISOPROSTOL 25 MCG QUARTER TABLET: 25.0000 ug | ORAL_TABLET | Freq: Once | ORAL | Status: AC

## 2022-05-11 MED ORDER — LAMOTRIGINE 100 MG PO TABS
200.0000 mg | ORAL_TABLET | Freq: Every day | ORAL | Status: DC
  Administered 2022-05-11: 200 mg via ORAL
  Filled 2022-05-11: qty 2

## 2022-05-11 MED ORDER — PHENYLEPHRINE 80 MCG/ML (10ML) SYRINGE FOR IV PUSH (FOR BLOOD PRESSURE SUPPORT): 80.0000 ug | PREFILLED_SYRINGE | INTRAVENOUS | Status: DC | PRN

## 2022-05-11 MED ORDER — CEFAZOLIN SODIUM-DEXTROSE 2-4 GM/100ML-% IV SOLN
2.0000 g | Freq: Once | INTRAVENOUS | Status: DC
Start: 2022-05-12 — End: 2022-05-11

## 2022-05-11 MED ORDER — LACTATED RINGERS IV SOLN
500.0000 mL | INTRAVENOUS | Status: DC | PRN
  Administered 2022-05-11: 500 mL via INTRAVENOUS

## 2022-05-11 MED ORDER — SOD CITRATE-CITRIC ACID 500-334 MG/5ML PO SOLN: 30.0000 mL | ORAL | Status: DC | PRN

## 2022-05-11 MED ORDER — OXYCODONE-ACETAMINOPHEN 5-325 MG PO TABS: 2.0000 | ORAL_TABLET | ORAL | Status: DC | PRN

## 2022-05-11 MED ORDER — MISOPROSTOL 25 MCG QUARTER TABLET
25.0000 ug | ORAL_TABLET | ORAL | Status: DC
  Administered 2022-05-11: 25 ug via ORAL
  Filled 2022-05-11: qty 1

## 2022-05-11 NOTE — Anesthesia Procedure Notes (Signed)
Epidural Patient location during procedure: OB Start time: 05/11/2022 1:47 PM  Staffing Performed: resident/CRNA   Preanesthetic Checklist Completed: patient identified, IV checked, site marked, risks and benefits discussed, surgical consent, monitors and equipment checked, pre-op evaluation and timeout performed  Epidural Patient position: sitting Prep: ChloraPrep Patient monitoring: heart rate, continuous pulse ox and blood pressure Approach: midline Location: L4-L5 Injection technique: LOR saline  Needle:  Needle type: Tuohy  Needle gauge: 17 G Needle length: 9 cm Needle insertion depth: 8 cm Catheter type: closed end flexible Catheter size: 19 Gauge Catheter at skin depth: 13 cm Test dose: negative and 1.5% lidocaine with Epi 1:200 K  Assessment Events: blood not aspirated, injection not painful, no injection resistance, no paresthesia and negative IV test  Additional Notes Pt tolerated well.  Atraumatic attempt X 1.

## 2022-05-11 NOTE — Progress Notes (Signed)
OB Attending Progress Note  Called to assess patient for retained placenta by [name], CNM. Patient is a 46 y.o. G49P1001 female s/p NSVD with placenta in place for ~ 42 minutes. At time of notification, patient's bleeding was controlled. Pitocin had initially been started after delivery of the fetus, but then was held for due to placental retention. Upon my arrival at 10:04 PM, patient noted to have an increase in bleeding. On exam, placenta with no movement on downward traction. Walking up cord, placenta noted to be anterior without separation from placental wall.  Bimanual exam performed with fundal massage, with manual separation of the placenta.  The placenta was delivered intact.  Pitocin re-initiated.  Survey of the vaginal canal notes 2nd degree laceration, [name] to repair. Will administer dose of Ancef postpartum.    Hildred Laser, MD [facility]

## 2022-05-11 NOTE — Progress Notes (Signed)
Patient and partner ambulating in hallways. Ok per CNM, off EFM.

## 2022-05-11 NOTE — Progress Notes (Addendum)
LABOR NOTE   SUBJECTIVE:   Lisa Schroeder is a 46 y.o.GP@ at [redacted]w[redacted]d whose labor is being induced for BMI>40 and AMA. She has received 2 doses of misoprostol and is having painful contractions. She has made cervical change since her last exam.  Analgesia: Labor support without medications and plans epidural  OBJECTIVE:  BP (!) 140/89   Pulse 93   Temp 98.5 F (36.9 C) (Oral)   Resp 17   Ht 5\' 6"  (1.676 m)   Wt 121 kg   LMP 08/03/2021   BMI 43.06 kg/m  No intake/output data recorded.  SVE:   Dilation: 3 Effacement (%): 60 Station: -3 Exam by:: [email or web address], CNM Vertex confirmed by [place] Livings  CONTRACTIONS: regular, every 2 minutes FHR: Fetal heart tracing reviewed. Baseline: 145 Variability: moderate Accelerations: present Decelerations:none Category 1  Labs: Lab Results  Component Value Date   WBC 12.9 (H) 05/11/2022   HGB 12.2 05/11/2022   HCT 36.5 05/11/2022   MCV 81.7 05/11/2022   PLT 272 05/11/2022    ASSESSMENT: 1)  Cervical ripening, progressing well     Coping: well. Good support.     Membranes: intact  Principal Problem:   Encounter for induction of labor   PLAN: Observe contraction pattern and recheck cervix in about 2 hours Augmentation with Pitocin if no cervical change Consider AROM when head well applied to cervix Anticipate NSVD  05/13/2022, CNM 05/11/2022 12:04 PM

## 2022-05-11 NOTE — Discharge Summary (Signed)
Postpartum Discharge Summary  Date of Service updated 05/13/2022     Patient Name: Lisa Schroeder DOB: 06-Apr-1976 MRN: [id number]  Date of admission: 05/11/2022 Delivery date:05/11/2022  Delivering provider: Lurlean Horns  Date of discharge: 05/13/2022  Admitting diagnosis: Encounter for induction of labor [Z34.90] Intrauterine pregnancy: [redacted]w[redacted]d    Secondary diagnosis:  Principal Problem:   Encounter for induction of labor  Additional problems: AMA, BMI>40    Discharge diagnosis: Term Pregnancy Delivered                                              Post partum procedures: manual removal of placenta Augmentation: AROM, Pitocin, and Cytotec Complications: [id number] Hospital course: Onset of Labor With Vaginal Delivery      47y.o. yo G2P1001 at 486w1das admitted for labor induction on 05/11/2022. Patient had an uncomplicated labor course as follows:  Membrane Rupture Time/Date: 2:35 PM ,05/11/2022   Delivery Method:Vaginal, Spontaneous  Episiotomy: None  Lacerations:  2nd degree , left labial Retained placenta with manual removal Patient had an uncomplicated postpartum course.  She is ambulating, tolerating a regular diet, passing flatus, and urinating well. Patient is discharged home in stable condition on 05/13/22.  Newborn Data: Birth date:05/11/2022  Birth time:9:07 PM  Gender:Female  Living status:Living  Apgars:8 ,10  Weight:3420 g   Magnesium Sulfate received: No BMZ received: No Rhophylac:N/A MMR:N/A T-DaP:Given prenatally Flu: N/A Transfusion:No  Physical exam  Vitals:   05/12/22 0736 05/12/22 1259 05/12/22 1544 05/13/22 0021  BP: 111/67 122/73 103/71 119/86  Pulse: 94 74 94 87  Resp: _0 Temp: (!) 97.5 F (36.4 C) 98.4 F (36.9 C) 97.6 F (36.4 C) 97.8 F (36.6 C)  TempSrc: Oral Axillary Oral Oral  SpO2: 99% 99% 98% 100%  Weight:      Height:       General: alert, cooperative, and no distress Lochia:  appropriate Uterine Fundus: firm Incision: N/A DVT Evaluation: No evidence of DVT seen on physical exam. No cords or calf tenderness. No significant calf/ankle edema. Labs: Lab Results  Component Value Date   WBC 21.5 (H) 05/12/2022   HGB 10.7 (L) 05/12/2022   HCT 32.7 (L) 05/12/2022   MCV 82.4 05/12/2022   PLT 228 05/12/2022      Latest Ref Rng & Units 12/26/2018    1:45 AM  CMP  Glucose 70 - 99 mg/dL 141   BUN 6 - 20 mg/dL 11   Creatinine 0.44 - 1.00 mg/dL 0.71   Sodium 135 - 145 mmol/L 139   Potassium 3.5 - 5.1 mmol/L 3.9   Chloride 98 - 111 mmol/L 106   CO2 22 - 32 mmol/L 20   Calcium 8.9 - 10.3 mg/dL 8.6   Total Protein 6.5 - 8.1 g/dL 7.1   Total Bilirubin 0.3 - 1.2 mg/dL 0.3   Alkaline Phos 38 - 126 U/L 65   AST 15 - 41 U/L 19   ALT 0 - 44 U/L 20    Edinburgh Score:    05/12/2022    7:32 AM  Edinburgh Postnatal Depression Scale Screening Tool  I have been able to laugh and see the funny side of things. 1  I have looked forward with enjoyment to things. 1  I have blamed myself unnecessarily when things went wrong. 1  I have  been anxious or worried for no good reason. 2  I have felt scared or panicky for no good reason. 2  Things have been getting on top of me. 1  I have been so unhappy that I have had difficulty sleeping. 1  I have felt sad or miserable. 2  I have been so unhappy that I have been crying. 1  The thought of harming myself has occurred to me. 0  Edinburgh Postnatal Depression Scale Total 12      After visit meds:  Allergies as of 05/13/2022   No Known Allergies      Medication List     STOP taking these medications    aspirin 81 MG chewable tablet       TAKE these medications    FISH OIL PO Take by mouth.   lamoTRIgine 100 MG tablet Commonly known as: LAMICTAL TAKE 2 TABLETS BY MOUTH EVERY DAY   levothyroxine 50 MCG tablet Commonly known as: SYNTHROID Take by mouth.   Lurasidone HCl 120 MG Tabs Commonly known as:  Latuda Take 1 tablet (120 mg total) by mouth daily at 12 noon.   M-Natal Plus 27-1 MG Tabs TAKE 1 TABLET BY MOUTH EVERY DAY   norethindrone 0.35 MG tablet Commonly known as: MICRONOR Take 1 tablet (0.35 mg total) by mouth daily.         Discharge home in stable condition Infant Feeding: Breast Infant Disposition:home with mother Discharge instruction: per After Visit Summary and Postpartum booklet. Activity: Advance as tolerated. Pelvic rest for 6 weeks.  Diet: routine diet Anticipated Birth Control: POPs Postpartum Appointment: 2 week video vist, 6 week ppv in office Additional Postpartum F/U: Postpartum Depression checkup Future Appointments: Future Appointments  Date Time Provider [place]  05/24/2022 11:30 AM Philip Aspen, CNM [facility] None  06/11/2022 10:00 AM Cottle, Billey Co., MD [facility] None   Follow up Visit:  Follow-up Information     Lurlean Horns, CNM. Schedule an appointment as soon as possible for a visit.   Specialty: Obstetrics Why: video visit at 2 weeks office visit at 6 weeks Contact information: [address] [place] [address] [phone]                   Philip Aspen, CNM

## 2022-05-11 NOTE — Progress Notes (Signed)
LABOR NOTE   SUBJECTIVE:   Lisa Schroeder is a 46 y.o.GP@ at [redacted]w[redacted]d. Her labor is being induced for BMI>40 and AMA. She has received 2 doses of misoprostol and is making cervical change. She is now comfortable with her epidural.  Analgesia: Epidural  OBJECTIVE:  BP 111/67   Pulse 97   Temp 98.3 F (36.8 C) (Oral)   Resp 19   Ht 5\' 6"  (1.676 m)   Wt 121 kg   LMP 08/03/2021   SpO2 100%   BMI 43.06 kg/m  Total I/O In: 1382.2 [I.V.:1382.2] Out: -   SVE:   Dilation: 4 Effacement (%): 90 Station: -2 Exam by:: M.Aubryanna Nesheim, CNM CONTRACTIONS: regular, every 1.5-3 minutes FHR: Fetal heart tracing reviewed. Baseline: 150 Variability: minimal Accelerations: present Decelerations:variable and isolated late Category 2  Labs: Lab Results  Component Value Date   WBC 12.9 (H) 05/11/2022   HGB 12.2 05/11/2022   HCT 36.5 05/11/2022   MCV 81.7 05/11/2022   PLT 272 05/11/2022    ASSESSMENT: 1)  Induction of labor, progressing well     Coping: well. Pleased with epidural     Membranes: intact      Principal Problem:   Encounter for induction of labor   PLAN: Continue present management Consider Pitocin or AROM if contractions space Anticipate NSVD Dr. 05/13/2022 updated  Valentino Saxon, CNM 05/11/2022 2:09 PM

## 2022-05-11 NOTE — Anesthesia Preprocedure Evaluation (Signed)
Anesthesia Evaluation  Patient identified by MRN, date of birth, ID band Patient awake    History of Anesthesia Complications Negative for: history of anesthetic complications  Airway Mallampati: II  TM Distance: >3 FB Neck ROM: Full    Dental  (+) Teeth Intact   Pulmonary asthma (Remote hx mild asthma) , former smoker,           Cardiovascular negative cardio ROS       Neuro/Psych PSYCHIATRIC DISORDERS Depression Bipolar Disorder  Neuromuscular disease    GI/Hepatic GERD  ,  Endo/Other  Hypothyroidism   Renal/GU   negative genitourinary   Musculoskeletal negative musculoskeletal ROS (+)   Abdominal   Peds  Hematology   Anesthesia Other Findings   Reproductive/Obstetrics (+) Pregnancy                             Anesthesia Physical Anesthesia Plan  ASA: 2  Anesthesia Plan: Epidural   Post-op Pain Management:    Induction:   PONV Risk Score and Plan:   Airway Management Planned:   Additional Equipment:   Intra-op Plan:   Post-operative Plan:   Informed Consent: I have reviewed the patients History and Physical, chart, labs and discussed the procedure including the risks, benefits and alternatives for the proposed anesthesia with the patient or authorized representative who has indicated his/her understanding and acceptance.       Plan Discussed with: CRNA  Anesthesia Plan Comments:         Anesthesia Quick Evaluation

## 2022-05-11 NOTE — Progress Notes (Signed)
LABOR NOTE   SUBJECTIVE:   Lisa Schroeder is a 46 y.o.GP@ at [redacted]w[redacted]d who is having her labor induced for AMA and elevated BMI. She has had 2 doses of misoprostol and is receiving Pitocin now. She is feeling some pressure with her contractions and has noticed some increased bleeding. A moderate amount of bloody fluid is noted on her pad.  Analgesia: Epidural  OBJECTIVE:  BP 137/69   Pulse 99   Temp 98.8 F (37.1 C)   Resp 14   Ht 5\' 6"  (1.676 m)   Wt 121 kg   LMP 08/03/2021   SpO2 100%   BMI 43.06 kg/m  No intake/output data recorded.  SVE:   Dilation: Lip/rim Effacement (%): 100 Station: 0 Exam by:: [email or web address], CNM CONTRACTIONS: regular, every 2 minutes FHR: Fetal heart tracing reviewed. Baseline: 140 Variability: moderate Accelerations: present Decelerations:none Category 1  Labs: Lab Results  Component Value Date   WBC 12.9 (H) 05/11/2022   HGB 12.2 05/11/2022   HCT 36.5 05/11/2022   MCV 81.7 05/11/2022   PLT 272 05/11/2022    ASSESSMENT: 1) Induction of labor due to AMA and elevated BMI,  progressing well on pitocin     Coping: well, feeling some pressure     Membranes: ruptured, clear fluid     Moderate bleeding noted  Principal Problem:   Encounter for induction of labor   PLAN: Continue present management Position changes Continue to monitor bleeding Anticipate NSVD  05/13/2022, CNM 05/11/2022 7:52 PM

## 2022-05-11 NOTE — H&P (Signed)
Lisa Schroeder is a 46 y.o. female presenting for scheduled induction of labor at 40 weeks. She arived to the unit after midnight and was started on cervical ripening with oral and vaginal cytotec. Her husband is present and supportive.. OB History     Gravida  2   Para  1   Term  1   Preterm      AB      Living  1      SAB      IAB      Ectopic      Multiple      Live Births  1          Past Medical History:  Diagnosis Date   Abnormal Pap smear of cervix    Depression    HPV in female    Hypothyroidism    Past Surgical History:  Procedure Laterality Date   COLPOSCOPY     for many yrs per pt   WISDOM TOOTH EXTRACTION Bilateral    Family History: family history includes COPD in her mother; Cancer in her paternal uncle; Diabetes in her father and paternal grandfather; Heart disease in her father and paternal grandfather; Hypertension in her father; Pulmonary fibrosis in her mother; Thyroid disease in her mother. Social History:  reports that she quit smoking about 4 years ago. Her smoking use included cigarettes. She has never used smokeless tobacco. She reports that she does not currently use alcohol. She reports that she does not use drugs.     Maternal Diabetes: No Genetic Screening: Normal Maternal Ultrasounds/Referrals: Normal Fetal Ultrasounds or other Referrals:  Referred to Materal Fetal Medicine  Maternal Substance Abuse:  No Significant Maternal Medications:  Meds include: Syntroid Other: Lamictal,Latuda Significant Maternal Lab Results:  Group B Strep negative Number of Prenatal Visits:greater than 3 verified prenatal visits Other Comments:  None  Review of Systems  Constitutional: Negative.   HENT: Negative.    Eyes: Negative.   Respiratory: Negative.    Cardiovascular: Negative.   Gastrointestinal: Negative.   Endocrine: Negative.   Genitourinary: Negative.   Musculoskeletal: Negative.   All other systems reviewed and are  negative.  History Dilation: 2 Effacement (%): 50 Station: -3 Exam by:: Paula Compton CNM Blood pressure 132/83, pulse 85, temperature 98.5 F (36.9 C), temperature source Oral, resp. rate 17, height 5\' 6"  (1.676 m), weight 121 kg, last menstrual period 08/03/2021. Maternal Exam:  Introitus: Normal vulva.   Physical Exam Constitutional:      Appearance: Normal appearance. She is obese.  HENT:     Head: Normocephalic and atraumatic.  Cardiovascular:     Rate and Rhythm: Normal rate and regular rhythm.     Pulses: Normal pulses.     Heart sounds: Normal heart sounds.  Pulmonary:     Effort: Pulmonary effort is normal.     Breath sounds: Normal breath sounds.  Abdominal:     Comments: Gravid abdomen High BMI, + adipose  Genitourinary:    General: Normal vulva.     Rectum: Normal.  Musculoskeletal:        General: Normal range of motion.     Cervical back: Normal range of motion and neck supple.  Skin:    General: Skin is warm and dry.  Neurological:     General: No focal deficit present.     Mental Status: She is alert and oriented to person, place, and time.  Psychiatric:        Mood and Affect:  Mood normal.        Behavior: Behavior normal.     Comments: Bipolar disorder- takes Lamictal and Latuda daily     Prenatal labs: ABO, Rh: --/--/A POS Performed at [facility], [address] [place], [place] [address]  [phone]) Antibody: NEG (08/15 0135) Rubella: 1.57 (01/13 1452) RPR: Non Reactive (06/02 1136)  HBsAg: Negative (01/13 1452)  HIV: Non Reactive (01/13 1452)  GBS: Negative/-- (07/14 1158)   Assessment/Plan: IUP 40 weeks 1 day for scheduled IOL AMA Bipolar sidorder- takes Lamictal and Latuda daily High BMI- 44 GBS negative Favorable cervix-bishop score is 5. Category 1 FHTs  [place]: dosed with Cytotec- 25 mcg vaginally and orally. Recheck in 4 hours. Anticipate pitocin. Routine admission labs and orders She plans an  epidural. NSVD likely  Mirna Mires 05/11/2022, 7:58 AM

## 2022-05-12 LAB — CBC
HCT: 32.7 % — ABNORMAL LOW (ref 36.0–46.0)
Hemoglobin: 10.7 g/dL — ABNORMAL LOW (ref 12.0–15.0)
MCH: 27 pg (ref 26.0–34.0)
MCHC: 32.7 g/dL (ref 30.0–36.0)
MCV: 82.4 fL (ref 80.0–100.0)
Platelets: 228 10*3/uL (ref 150–400)
RBC: 3.97 MIL/uL (ref 3.87–5.11)
RDW: 14.6 % (ref 11.5–15.5)
WBC: 21.5 10*3/uL — ABNORMAL HIGH (ref 4.0–10.5)
nRBC: 0 % (ref 0.0–0.2)

## 2022-05-12 MED ORDER — METHYLERGONOVINE MALEATE 0.2 MG/ML IJ SOLN: 0.2000 mg | INTRAMUSCULAR | Status: DC | PRN

## 2022-05-12 MED ORDER — CEFAZOLIN SODIUM-DEXTROSE 2-4 GM/100ML-% IV SOLN: 2.0000 g | Freq: Three times a day (TID) | INTRAVENOUS | Status: DC

## 2022-05-12 MED ORDER — OXYCODONE-ACETAMINOPHEN 5-325 MG PO TABS: 1.0000 | ORAL_TABLET | ORAL | Status: DC | PRN

## 2022-05-12 MED ORDER — OXYTOCIN-SODIUM CHLORIDE 30-0.9 UT/500ML-% IV SOLN: 2.5000 [IU]/h | INTRAVENOUS | Status: DC | PRN

## 2022-05-12 MED ORDER — LEVOTHYROXINE SODIUM 50 MCG PO TABS
50.0000 ug | ORAL_TABLET | Freq: Every day | ORAL | Status: DC
  Administered 2022-05-12 – 2022-05-13 (×2): 50 ug via ORAL
  Filled 2022-05-12 (×2): qty 1

## 2022-05-12 MED ORDER — WITCH HAZEL-GLYCERIN EX PADS: MEDICATED_PAD | CUTANEOUS | Status: DC | PRN

## 2022-05-12 MED ORDER — DOCUSATE SODIUM 100 MG PO CAPS: 100.0000 mg | ORAL_CAPSULE | Freq: Two times a day (BID) | ORAL | Status: DC

## 2022-05-12 MED ORDER — PRENATAL MULTIVITAMIN CH
1.0000 | ORAL_TABLET | Freq: Every day | ORAL | Status: DC
  Filled 2022-05-12: qty 1

## 2022-05-12 MED ORDER — CEFAZOLIN SODIUM-DEXTROSE 2-4 GM/100ML-% IV SOLN
2.0000 g | Freq: Once | INTRAVENOUS | Status: AC
  Administered 2022-05-12: 2 g via INTRAVENOUS
  Filled 2022-05-12: qty 100

## 2022-05-12 MED ORDER — ACETAMINOPHEN 325 MG PO TABS: 650.0000 mg | ORAL_TABLET | ORAL | Status: DC | PRN

## 2022-05-12 MED ORDER — SIMETHICONE 80 MG PO CHEW: 80.0000 mg | CHEWABLE_TABLET | ORAL | Status: DC | PRN

## 2022-05-12 MED ORDER — DIPHENHYDRAMINE HCL 25 MG PO CAPS: 25.0000 mg | ORAL_CAPSULE | Freq: Four times a day (QID) | ORAL | Status: DC | PRN

## 2022-05-12 MED ORDER — DOCUSATE SODIUM 100 MG PO CAPS
100.0000 mg | ORAL_CAPSULE | Freq: Two times a day (BID) | ORAL | Status: DC
  Administered 2022-05-12 (×2): 100 mg via ORAL
  Filled 2022-05-12 (×2): qty 1

## 2022-05-12 MED ORDER — METHYLERGONOVINE MALEATE 0.2 MG PO TABS: 0.2000 mg | ORAL_TABLET | ORAL | Status: DC | PRN

## 2022-05-12 MED ORDER — COCONUT OIL OIL: 1.0000 | TOPICAL_OIL | Status: DC | PRN

## 2022-05-12 NOTE — Lactation Note (Signed)
This note was copied from a baby's chart. Lactation Consultation Note  Patient Name: Lisa Schroeder [id number] Date: 05/12/2022 Reason for consult: Initial assessment;Term Age:45 hours  Maternal Data Has patient been taught Hand Expression?: Yes Does the patient have breastfeeding experience prior to this delivery?: Yes How long did the patient breastfeed?: 3 months  Mom is P2, SVD 12hrs ago. She breastfed her first child (now 42yrs old) for a few months; low milk supply.   Feeding Mother's Current Feeding Choice: Breast Milk  Baby has fed since delivery, latched without discomfort per mom, but mom worried/concerned that  [facility] Score   Baby did not feed during this visit.  Lactation Tools Discussed/Used    Interventions Interventions: Breast feeding basics reviewed;Hand express;Education (First 24hr newborn feeding patterns/behaviors, hand expression, early hunger cues, achieving a deep latch/position)  Reviewed all listed above, demonstrated hand expression, discussed milk supply and demand and normal course of lactation. Answered questions re: pumping introduction/timing, feeding frequency, and how often to feed.  Encouraged feeding on demand, offering every few hours and skin to skin.  Discharge Pump: Personal  Consult Status Consult Status: Follow-up Date: 05/12/22 Follow-up type: In-patient  Whiteboard updated with LC name/number.  Danford Bad 05/12/2022, 10:04 AM

## 2022-05-12 NOTE — Progress Notes (Signed)
Obstetric Postpartum Daily Progress Note Subjective:  46 y.o. G2P1001 postpartum day # 14.5 hours status post vaginal delivery.  She is ambulating, is tolerating po, is voiding spontaneously.  Her pain is well controlled on PO pain medications. Her lochia is less than menses. Breastfeeding is a work in progress, infant has latched but has been sleepy. Partner present at her side.    Medications SCHEDULED MEDICATIONS   docusate sodium  100 mg Oral BID   ibuprofen  600 mg Oral Q6H   levothyroxine  50 mcg Oral Q0600   prenatal multivitamin  1 tablet Oral Q1200    MEDICATION INFUSIONS    PRN MEDICATIONS  acetaminophen, benzocaine-Menthol, coconut oil, diphenhydrAMINE, methylergonovine **OR** methylergonovine, oxyCODONE-acetaminophen, simethicone, witch hazel-glycerin    Objective:   Vitals:   05/11/22 2352 05/12/22 0230 05/12/22 0331 05/12/22 0736  BP: (!) 113/57 138/89 118/78 111/67  Pulse: 93 92 (!) 101 94  Resp:  20 18 18   Temp:  97.9 F (36.6 C) 98 F (36.7 C) (!) 97.5 F (36.4 C)  TempSrc:  Oral Oral Oral  SpO2:  97% 98% 99%  Weight:      Height:        Current Vital Signs 24h Vital Sign Ranges  T (!) 97.5 F (36.4 C) Temp  Avg: 98.5 F (36.9 C)  Min: 97.5 F (36.4 C)  Max: 100 F (37.8 C)  BP 111/67 BP  Min: 106/67  Max: 154/85  HR 94 Pulse  Avg: 89.4  Min: 75  Max: 104  RR 18 Resp  Avg: 17.5  Min: 14  Max: 20  SaO2 99 % Room Air SpO2  Avg: 99.1 %  Min: 97 %  Max: 100 %       24 Hour I/O Current Shift I/O  Time Ins Outs 08/15 0701 - 08/16 0700 In: 3121.4 [I.V.:2870.6] Out: 1225  08/16 0701 - 08/16 1900 In: -  Out: 600 [Urine:600]  General: NAD Breasts: soft, no redness or masses, nipples erect and intact bilaterally  Pulmonary: no increased work of breathing Abdomen: non-distended, non-tender, fundus firm at level of umbilicus Perineum: well approximated, slightly swollen  Extremities: no edema, no erythema, no tenderness  Labs:  Recent Labs  Lab  05/11/22 0135 05/12/22 0545  WBC 12.9* 21.5*  HGB 12.2 10.7*  HCT 36.5 32.7*  PLT 272 228     Assessment:   46 y.o. G2P1001 postpartum day # 14.5 hours status post SVB  Plan:   1) Acute blood loss anemia - hemodynamically stable and asymptomatic - po ferrous sulfate  2) A POS Performed at [facility], [address] [place], [place]  / [address] 1.57 (01/13 1452)/ Varicella Immune  3) TDAP status up to date  4) breast /Contraception = oral progesterone-only contraceptive  5) Disposition Discharge 05/13/22   05/15/22 [facility], CNM  05/12/2022 11:53 AM

## 2022-05-12 NOTE — Progress Notes (Signed)
Nutrition Brief Note  Patient identified on the [facility] Report  Wt Readings from Last 15 Encounters:  05/11/22 121 kg  05/06/22 121.6 kg  04/30/22 120.1 kg  04/20/22 120.5 kg  04/14/22 118.4 kg  04/09/22 118.1 kg  03/26/22 115.9 kg  02/26/22 117.8 kg  01/29/22 113 kg  01/14/22 112.9 kg  12/29/21 111.9 kg  12/01/21 110.5 kg  10/28/21 109.8 kg  10/09/21 109.6 kg  10/06/21 109.3 kg   Pt presenting for scheduled induction of labor at 40 weeks.   8/15- s/p vaginal delivery  Pregravid wt 240#. Pt has gained around 25# this pregnancy, which is over recommended weight gain for pregnancy.   Medications reviewed and include colace and prenatal MVI.   Current diet order is regular, patient is consuming approximately n/a% of meals at this time. Labs and medications reviewed.   No nutrition interventions warranted at this time. If nutrition issues arise, please consult RD.   Levada Schilling, RD, LDN, CDCES Registered Dietitian II Certified Diabetes Care and Education Specialist Please refer to [facility] for RD and/or RD on-call/weekend/after hours pager

## 2022-05-12 NOTE — TOC Initial Note (Signed)
Transition of Care [facility]) - Initial/Assessment Note    Patient Details  Name: Lisa Schroeder MRN: [id number] Date of Birth: 02-26-1976  [facility] CM/SW Contact:    Shelbie Hutching, RN Phone Number: 05/12/2022, 4:14 PM  Clinical Narrative:                 [facility] consult placed for Post partum depression score above 10.  RNCM met with patient and her significant other at the bedside.  Significant other is Clayborne Artist, infant daughter is Social worker.  Patient also has a 28 year old daughter named Water quality scientist at home. Patient reports that she has a psychiatrist in [place] with [facility], Dr. Cristy Friedlander, she also sees a therapist every 2 weeks in [place], [place].  Patient is on 2 medications to help with depression, Lamictal and Latuda, she has been seeing her therapist for 4-5 years now.  RNCM did provide patient with Postpartum Progress Sheet to keep track of symptoms, she has a psychiatry appointment scheduled for the middle of September but knows how to contact her provider is she needs to be seen sooner.  Patient reports excellent family support.   Home is prepared and they have all necessary supplies, she plans on breast feeding.  Pediatrician is going to [employer].  Family has reliable transportation.  No additional TOC needs identified.          Patient Goals and CMS Choice        Expected Discharge Plan and Services                                                Prior Living Arrangements/Services                       Activities of Daily Living Home Assistive Devices/Equipment: None ADL Screening (condition at time of admission) Patient's cognitive ability adequate to safely complete daily activities?: Yes Is the patient deaf or have difficulty hearing?: No Does the patient have difficulty seeing, even when wearing glasses/contacts?: No Does the patient have difficulty concentrating, remembering, or  making decisions?: No Patient able to express need for assistance with ADLs?: Yes Does the patient have difficulty dressing or bathing?: No Independently performs ADLs?: Yes (appropriate for developmental age) Does the patient have difficulty walking or climbing stairs?: No Weakness of Legs: None Weakness of Arms/Hands: None  Permission Sought/Granted                  Emotional Assessment              Admission diagnosis:  Encounter for induction of labor [Z34.90] Patient Active Problem List   Diagnosis Date Noted   Encounter for induction of labor 05/11/2022   LGSIL on Pap smear of cervix 04/20/2022   Supervision of elderly multigravida (>=45 years old at time of delivery), third trimester 04/07/2022   Hypothyroidism (acquired) 04/07/2022   Obesity in pregnancy, antepartum, third trimester 04/07/2022   Pregnancy 01/14/2022   Pregnancy related carpal tunnel syndrome in second trimester 12/29/2021   Supervision of high risk primigravida of advanced maternal age in first trimester 10/28/2021   ADD (attention deficit disorder) 09/17/2018   Bipolar II disorder ([place]) 09/17/2018   PCP:  Tracie Harrier, MD Pharmacy:   [id number] [place] [place] [address]  [place] [address] Phone: [phone] Fax: [phone]     Social Determinants of Health (SDOH) Interventions    Readmission Risk Interventions     No data to display

## 2022-05-12 NOTE — Anesthesia Postprocedure Evaluation (Signed)
Anesthesia Post Note  Patient: Lisa Schroeder  Procedure(s) Performed: AN AD HOC LABOR EPIDURAL  Patient location during evaluation: Mother Baby Anesthesia Type: Epidural Level of consciousness: awake and alert Pain management: pain level controlled Vital Signs Assessment: post-procedure vital signs reviewed and stable Respiratory status: spontaneous breathing, nonlabored ventilation and respiratory function stable Cardiovascular status: stable Postop Assessment: no headache, no backache and epidural receding Anesthetic complications: no   No notable events documented.   Last Vitals:  Vitals:   05/12/22 0230 05/12/22 0331  BP: 138/89 118/78  Pulse: 92 (!) 101  Resp: 20 18  Temp: 36.6 C 36.7 C  SpO2: 97% 98%    Last Pain:  Vitals:   05/12/22 0331  TempSrc: Oral  PainSc:                  Elmarie Mainland

## 2022-05-13 LAB — SURGICAL PATHOLOGY

## 2022-05-13 MED ORDER — NORETHINDRONE 0.35 MG PO TABS: 1.0000 | ORAL_TABLET | Freq: Every day | ORAL | 11 refills | Status: DC

## 2022-05-13 NOTE — Progress Notes (Signed)
Patient responsive to discharge paperwork. RN answered patients questions. Patient had no follow up questions.

## 2022-05-13 NOTE — Final Progress Note (Signed)
Final Progress Note  Patient ID: Lisa Schroeder MRN: [id number] DOB/AGE: 1975/10/26 46 y.o.  Admit date: 05/11/2022 Admitting provider: Hildred Laser, MD Discharge date: 05/13/2022   Admission Diagnoses: Scheduled induction   Discharge Diagnoses:  Principal Problem:   Encounter for induction of labor    Consults: None  Significant Findings/ Diagnostic Studies: GBS negative  Procedures: Epidural, manual removal of placenta  Discharge Condition: good  Disposition: Discharge disposition: 01-Home or Self Care       Diet: Regular diet  Discharge Activity: Activity as tolerated   Allergies as of 05/13/2022   No Known Allergies      Medication List     STOP taking these medications    aspirin 81 MG chewable tablet       TAKE these medications    FISH OIL PO Take by mouth.   lamoTRIgine 100 MG tablet Commonly known as: LAMICTAL TAKE 2 TABLETS BY MOUTH EVERY DAY   levothyroxine 50 MCG tablet Commonly known as: SYNTHROID Take by mouth.   Lurasidone HCl 120 MG Tabs Commonly known as: Latuda Take 1 tablet (120 mg total) by mouth daily at 12 noon.   M-Natal Plus 27-1 MG Tabs TAKE 1 TABLET BY MOUTH EVERY DAY   norethindrone 0.35 MG tablet Commonly known as: MICRONOR Take 1 tablet (0.35 mg total) by mouth daily.        Follow-up Information     Glenetta Borg, CNM. Schedule an appointment as soon as possible for a visit.   Specialty: Obstetrics Why: video visit at 2 weeks office visit at 6 weeks Contact information: [address] [place] [address] [phone]                 Total time spent taking care of this patient: 15 minutes  Signed: Doreene Burke 05/13/2022, 8:40 AM

## 2022-05-13 NOTE — Lactation Note (Signed)
This note was copied from a baby's chart. Lactation Consultation Note  Patient Name: Lisa Schroeder [id number] Date: 05/13/2022 Reason for consult: Follow-up assessment;Term Age:45 hours  Maternal Data Has patient been taught Hand Expression?: Yes Does the patient have breastfeeding experience prior to this delivery?: Yes How long did the patient breastfeed?: 2-3 months    Feeding Mother's Current Feeding Choice: Breast Milk  Baby began formula feeding yesterday evening after multiple attempts to feed at the breast were unsuccessful; baby sleepy.  Baby has been tolerating formula well, volumes lower than expected for HOL- reviewed appropriate volumes for stomach size and overall feeding plan.  LATCH Score   Baby did not eat during this visit.  Lactation Tools Discussed/Used    Interventions Interventions: Breast feeding basics reviewed;Hand express;DEBP;Education;Pace feeding  Mom desires to continue to work on breastfeeding but is cautious against baby losing too much weight (due to previous BF experience).  Feeding plan: -Offer breast/let baby feed and suckle as long as active -Supplement with formula with appropriate volumes -Use DEBP post feeding attempts and if baby does not go to breast (every 3 hours)  Discharge Discharge Education: Engorgement and breast care;Warning signs for feeding baby;Outpatient recommendation Pump: Personal  Anticipatory guidance given for breast changes and management of breast fullness.  Outpatient lactation service information given. Encouraged to call with questions/concerns and ongoing BF support as needed.  Consult Status Consult Status: Complete    Danford Bad 05/13/2022, 9:57 AM

## 2022-05-24 ENCOUNTER — Encounter: Payer: Self-pay | Admitting: Certified Nurse Midwife

## 2022-05-24 ENCOUNTER — Telehealth (INDEPENDENT_AMBULATORY_CARE_PROVIDER_SITE_OTHER): Payer: BC Managed Care – PPO | Admitting: Certified Nurse Midwife

## 2022-05-24 DIAGNOSIS — Z1332 Encounter for screening for maternal depression: Secondary | ICD-10-CM | POA: Diagnosis not present

## 2022-05-24 NOTE — Progress Notes (Signed)
Virtual Visit via Telephone Note  I connected with Lisa Schroeder on 05/24/22 at 11:30 AM EDT by telephone and verified that I am speaking with the correct person using two identifiers.  Location: Patient: at home Provider: at the office   I discussed the limitations, risks, security and privacy concerns of performing an evaluation and management service by telephone and the availability of in person appointments. I also discussed with the patient that there may be a patient responsible charge related to this service. The patient expressed understanding and agreed to proceed.   History of Present Illness: G2P2 2 weeks status post SVD at 40 wk 1 day.   Observations/Objective: Doing well over all , state bleeding has improved, she state pain is minimal.  She is breast and bottle feeding and denies any issues with that. Her mood is stable . She has had virtual visits with her counselor.   Assessment and Plan: EDPNS 12 no change from discharge. Reviewed red flag symptoms   Follow Up Instructions: Follow up in office as scheduled in 4 wks or prn.    I discussed the assessment and treatment plan with the patient. The patient was provided an opportunity to ask questions and all were answered. The patient agreed with the plan and demonstrated an understanding of the instructions.   The patient was advised to call back or seek an in-person evaluation if the symptoms worsen or if the condition fails to improve as anticipated.  I provided 7 minutes of non-face-to-face time during this encounter.   Doreene Burke, CNM

## 2022-06-08 ENCOUNTER — Ambulatory Visit: Payer: BC Managed Care – PPO | Admitting: Psychiatry

## 2022-06-11 ENCOUNTER — Ambulatory Visit: Payer: BC Managed Care – PPO | Admitting: Psychiatry

## 2022-06-11 ENCOUNTER — Encounter: Payer: Self-pay | Admitting: Psychiatry

## 2022-06-11 DIAGNOSIS — F319 Bipolar disorder, unspecified: Secondary | ICD-10-CM

## 2022-06-11 DIAGNOSIS — F902 Attention-deficit hyperactivity disorder, combined type: Secondary | ICD-10-CM

## 2022-06-11 DIAGNOSIS — F4001 Agoraphobia with panic disorder: Secondary | ICD-10-CM | POA: Diagnosis not present

## 2022-06-11 MED ORDER — LAMOTRIGINE 100 MG PO TABS: 200.0000 mg | ORAL_TABLET | Freq: Every day | ORAL | 1 refills | Status: DC

## 2022-06-11 MED ORDER — LURASIDONE HCL 120 MG PO TABS: 120.0000 mg | ORAL_TABLET | Freq: Every day | ORAL | 0 refills | Status: DC

## 2022-06-11 MED ORDER — METHYLPHENIDATE HCL ER (OSM) 36 MG PO TBCR: 36.0000 mg | EXTENDED_RELEASE_TABLET | Freq: Every day | ORAL | 0 refills | Status: DC

## 2022-06-11 NOTE — Progress Notes (Signed)
Lisa Schroeder [id number] 10-19-1975 46 y.o.     Subjective:   Patient ID:  Lisa Schroeder is a 46 y.o. (DOB [birth year]) female.  Chief Complaint:  Chief Complaint  Patient presents with   Follow-up   Depression   ADHD    HPI Lisa Schroeder presents to the office today for follow-up of bipolar disorder and anxiety disorders.  visit June 2020.  She could not afford Metadate so she was given a prescription for Concerta 36 mg daily.  No other med changes.  She remained on Abilify 5 mg daily, lamotrigine 200 mg daily.   She called May 22, 2019 stating that she was having panic attacks and asked for prescription for Ativan.  It was okayed for her to use sparingly.   seen October 05, 2019.  She described a pattern of hypersexuality as well as some other manic symptoms that she and her therapist had discussed.  We decided to switch from Abilify to Depakote as a mood stabilizer.   11/16/2019 appointment with the following noted: Did switch sx.  Episodes mania are gone but more depression.  Couple weeks ago didn't want to leave the house.  Abilify did better with the depression. No manic sx in the last 3 weeks.  No further hypersexuality resolved.  Sleeping well.  Some racing thoughts but better.  Not irritable..  Some anxiety situational.   Concerta affordable. 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   Doing well without major issues.    Last visit changed to Metadate CD and that worked well but over $100.  Better energy and focus and more productive.  Tolerated well.  Ran out.  Dose seems good. Does ok without stimulant but better focus and energy and happier on it.  Be nice if insurance would cover it. Plan:Unfortunately she's been more depressed and has a tremor. Increase lamotrigine to 150 mg daily to help with depression BC tremor reduce Depakote to 750 mg HS.  Disc risk return of mania. Follow-up 8 weeks.   March 28, 2020  appointment with the following noted Fine.  Hardly ever take Concerta bc it caused the tremor. Disc goal to get pregnant and need to get off Depakote. Rare panic.  Tolerates Ativan.  Ok off Concerta. Not depressed.  No manic behavior. Plan: Wean Depakote for pregnancy over 10 days.  Disc risk of this causing birth defects etc.  Disc risk mood swings without it.  Call. First step would be to increase lamotrigine.   TC 06/06/20 Pt with rapid mood swings off Depakote.  Might be pregnant.  Needs another stabilizer.  Took Latuda before but stopped DT cost.  Agrees to restart bc it's one of safest in pregnancy.  Will send in RX for 40 mg Latuda.   07/02/2020 appointment with the following noted: Not pregnant.  "I feel 10 times better" in a few days.  Happier and more productive.  No crying anymore. No significant anger like before.  Sleep is good. Last Friday was dizzy and told she could be having SE.  He told her to cut Latuda to 20 and then increase it back to 40 mg.  Back on 40 mg for 4-5 days.   Plan: Off Depakote and on Latuda & lamotrigine  bc pursuing pregnancy and cannot have stability without medication.  10/01/2020 appointment with the following noted: Mood pretty stable except occ moodiness with PMS.  No sig depression.  Anxiety manageable with 2-3 Ativan  including when flying.  No major problems.  No SE. Pursuing pregnancy.   Plan: no changes  02/19/21 appt noted: Recent anger outbursts a couple of times per week.  Including today at work which is unusual..  2 weeks depression and didn't want to get out of bed but also on menstrual cycle.  Lasted a day and resolved. Hungry lately.  No change in meds.  Energy seems down overall.  No particular trigger for these changes.  Says she can handle fatigue better than she can handle the mood swings. Plan: Because of recent mood swings increase Latuda from 40 mg to 60 mg daily.   07/20/2021 appointment with the following noted: Increase Latuda  seemed to help with less emotional outbursts.  Manageable now.  Ativan helps if too stressed out. No SE. Consistent with Latuda 60 and Lamotrigine 200 mg daily. Plan: No med changes Continue Latuda 60 and lamotrigine 200 mg daily.  02/11/22 appt noted: Pregnant [redacted] weeks.  [facility] 05/10/22. gIRL. Pregnancy going well.  WILL BREASTFEED 3 MOS. Mood pretty good.  Some outbursts of anger only around H and now separated and now is fine. Separated since early Feb. No anger otherwise. Separated in the past and had a few therapy sessions.  Difference of opinion and conflict over her 65 yo daughter. HX REALLY BAD POST-PARTUM DEPRESSION BUT NO HI OR SI AND HIT DAY AFTER SHE WAS BORN Patient reports stable mood and denies depressed or irritable moods.  Patient denies any recent difficulty with anxiety.  Patient denies difficulty with sleep initiation or maintenance. Denies appetite disturbance. .  Patient denies any difficulty with concentration.  Patient denies any suicidal ideation.  06/11/22 appt noted: Continues Latuda 60 and lamotrigine 200 mg daily. Lisa Schroeder born 1 month ago today.  Delivery unremarkable and was induced. Schroeder is healthy.  Issues with breast feeding not enough milk so mostly bottle feeding.  Nurses several times daily.   Growing and normal. Done surprisingly well without sig depression. No bonding problems. Separated and so night care by herself.   Tolerating meds well.   Asks about Concerta.     1 Lisa 46yo.  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   Past Psychiatric Medication Trials: Citalopram, venlafaxine, Wellbutrin,  lamotrigine 200, Latuda 60 cost, Vraylar side effects, Abilify 5,   Rexulti Ritalin,    Review of Systems:  Review of Systems  Constitutional:  Positive for fatigue.  Cardiovascular:  Negative for chest pain and palpitations.  Neurological:  Negative for tremors.  Psychiatric/Behavioral:  Negative for dysphoric mood.     Medications: I have reviewed the patient's  current medications.  Current Outpatient Medications  Medication Sig Dispense Refill   lamoTRIgine (LAMICTAL) 100 MG tablet TAKE 2 TABLETS BY MOUTH EVERY DAY 180 tablet 0   levothyroxine (SYNTHROID) 50 MCG tablet Take by mouth.     Lurasidone HCl (LATUDA) 120 MG TABS Take 1 tablet (120 mg total) by mouth daily at 12 noon. (Patient taking differently: Take 60 mg by mouth every evening.) 90 tablet 0   norethindrone (MICRONOR) 0.35 MG tablet Take 1 tablet (0.35 mg total) by mouth daily. 30 tablet 11   Prenatal Vit-Fe Fumarate-FA (M-NATAL PLUS) 27-1 MG TABS TAKE 1 TABLET BY MOUTH EVERY DAY 90 tablet 1   levothyroxine (SYNTHROID) 50 MCG tablet Take by mouth.     No current facility-administered medications for this visit.    Medication Side Effects as noted  Allergies: No Known Allergies  Past Medical History:  Diagnosis  Date   Abnormal Pap smear of cervix    Depression    HPV in female    Hypothyroidism     Family History  Problem Relation Age of Onset   COPD Mother    Thyroid disease Mother    Pulmonary fibrosis Mother    Hypertension Father    Diabetes Father    Heart disease Father    Cancer Paternal Uncle    Diabetes Paternal Grandfather    Heart disease Paternal Grandfather    Breast cancer Neg Hx    Cervical cancer Neg Hx    Colon cancer Neg Hx    Asthma Neg Hx    Kidney disease Neg Hx    Stroke Neg Hx     Social History   Socioeconomic History   Marital status: Married    Spouse name: Legrand Como   Number of children: 1   Years of education: Not on file   Highest education level: Not on file  Occupational History   Not on file  Tobacco Use   Smoking status: Former    Types: Cigarettes    Quit date: 01/03/2018    Years since quitting: 4.4   Smokeless tobacco: Never  Vaping Use   Vaping Use: Former   Quit date: 04/08/2020   Substances: Nicotine  Substance and Sexual Activity   Alcohol use: Not Currently    Comment: not while preg   Drug use: No   Sexual  activity: Yes    Birth control/protection: Surgical    Comment: Undecided  Other Topics Concern   Not on file  Social History Narrative   13 year old daughter    Social Determinants of Health   Financial Resource Strain: Not on file  Food Insecurity: Not on file  Transportation Needs: Not on file  Physical Activity: Not on file  Stress: Not on file  Social Connections: Not on file  Intimate Partner Violence: Not on file    Past Medical History, Surgical history, Social history, and Family history were reviewed and updated as appropriate.   Please see review of systems for further details on the patient's review from today.   Objective:   Physical Exam:  There were no vitals taken for this visit.  Physical Exam Constitutional:      General: She is not in acute distress. Musculoskeletal:        General: No deformity.  Neurological:     Mental Status: She is alert and oriented to person, place, and time.     Cranial Nerves: No dysarthria.     Coordination: Coordination normal.  Psychiatric:        Attention and Perception: Attention and perception normal. She does not perceive auditory or visual hallucinations.        Mood and Affect: Mood normal. Mood is not anxious or depressed. Affect is not labile, blunt, angry or inappropriate.        Speech: Speech normal.        Behavior: Behavior normal. Behavior is not agitated. Behavior is cooperative.        Thought Content: Thought content normal. Thought content is not paranoid or delusional. Thought content does not include homicidal or suicidal ideation. Thought content does not include suicidal plan.        Cognition and Memory: Cognition and memory normal.        Judgment: Judgment normal.     Comments: Insight intact      Lab Review:     Component  Value Date/Time   NA 139 12/26/2018 0145   NA 141 01/17/2017 1714   K 3.9 12/26/2018 0145   CL 106 12/26/2018 0145   CO2 20 (L) 12/26/2018 0145   GLUCOSE 141 (H)  12/26/2018 0145   BUN 11 12/26/2018 0145   BUN 10 01/17/2017 1714   CREATININE 0.71 12/26/2018 0145   CALCIUM 8.6 (L) 12/26/2018 0145   PROT 7.1 12/26/2018 0145   PROT 7.7 01/17/2017 1714   ALBUMIN 3.6 12/26/2018 0145   ALBUMIN 4.7 01/17/2017 1714   AST 19 12/26/2018 0145   ALT 20 12/26/2018 0145   ALKPHOS 65 12/26/2018 0145   BILITOT 0.3 12/26/2018 0145   BILITOT 0.3 01/17/2017 1714   GFRNONAA >60 12/26/2018 0145   GFRAA >60 12/26/2018 0145       Component Value Date/Time   WBC 21.5 (H) 05/12/2022 0545   RBC 3.97 05/12/2022 0545   HGB 10.7 (L) 05/12/2022 0545   HGB 11.5 02/26/2022 1136   HCT 32.7 (L) 05/12/2022 0545   HCT 35.2 02/26/2022 1136   PLT 228 05/12/2022 0545   PLT 290 02/26/2022 1136   MCV 82.4 05/12/2022 0545   MCV 85 02/26/2022 1136   MCH 27.0 05/12/2022 0545   MCHC 32.7 05/12/2022 0545   RDW 14.6 05/12/2022 0545   RDW 14.3 02/26/2022 1136    No results found for: "POCLITH", "LITHIUM"   No results found for: "PHENYTOIN", "PHENOBARB", "VALPROATE", "CBMZ"   .res Assessment: Plan:    There are no diagnoses linked to this encounter.   Overall mood stability is a bit worse.  Patient has a history of good stability in mood with a combination of Depakote and Latuda.  Off Depakote and on Latuda bc pursuing pregnancy and cannot have stability without medication BC irritabilty.  Mood lability good with increase Latuda 60 mg and overall "way more stable" than with Depakote.  Extensive discussion with great detail surrounding her goal to get pregnant in the short-term and having bipolar disorder.  Discussed the risks with and without medication.   Lamotrigine is considered relatively safe for her.  Also discussed options like Latuda but cost was a barrier with that medication.  Discussed the fact that lamotrigine is less effective at preventing mania and some of the other mood stabilizers but should provide some benefit for that purpose.  She elected to continue  lamotrigine.  Anette Guarneri is now more affordable   Because of prior mood swings increased Latuda from 40 mg to 60 mg daily and she's markedly better.  Discussed the usual dosage range.  She is taking a relatively low dose to be the primary mood stabilizer. Discussed potential metabolic side effects associated with atypical antipsychotics, as well as potential risk for movement side effects. Advised pt to contact office if movement side effects occur.   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  Okay as needed lorazepam for rare panic attacks.  Needs Ativan for trips.     OK lorazepam rarely and not recommended when pregnant.  We discussed the short-term risks associated with benzodiazepines including sedation and increased fall risk among others.  Discussed long-term side effect risk including dependence, potential withdrawal symptoms, and the potential eventual dose-related risk of dementia.  Will not drive at destination.  Ativan 1 mg 1-2 before trip if needed for flying phobia Disc risk in pregnancy.  BC prior serious post-partum depression disc risk again at length and what to do.  Continue Latuda 60 and lamotrigine 200 mg daily. Wants Brand Latuda bc cheaper with  discount card.   Reviewed NIH data on breastfeeding and MPH.  Limited data suggest no sig amounts in infant serum. OK return to Concerta 36 mg AM Disc risk and if less mild production or appetite in child, jitteriness let us know.  Supportive therapy around marital crisis and rec marital counseling.  She's not interested.  FU 3 mos   Meredith Staggers, MD, DFAPA Please see After Visit Summary for patient specific instructions.  Future Appointments  Date Time Provider Department Center  06/23/2022  9:30 AM Glenetta Borg, CNM [facility] None     No orders of the defined types were placed in this encounter.   -------------------------------

## 2022-06-23 ENCOUNTER — Encounter: Payer: BC Managed Care – PPO | Admitting: Obstetrics

## 2022-07-06 ENCOUNTER — Ambulatory Visit (INDEPENDENT_AMBULATORY_CARE_PROVIDER_SITE_OTHER): Payer: BC Managed Care – PPO | Admitting: Obstetrics

## 2022-07-06 ENCOUNTER — Encounter: Payer: Self-pay | Admitting: Obstetrics

## 2022-07-06 VITALS — BP 130/91 | HR 78 | Ht 65.0 in | Wt 251.0 lb

## 2022-07-06 DIAGNOSIS — Z1231 Encounter for screening mammogram for malignant neoplasm of breast: Secondary | ICD-10-CM

## 2022-07-06 NOTE — Progress Notes (Signed)
Post Partum Visit Note  Lisa Schroeder is a 46 y.o. G64P1001 female who presents for a postpartum visit. She is 8 weeks postpartum following a normal spontaneous vaginal delivery.  I attended the birth and have fully reviewed the prenatal and intrapartum course. The delivery was at [redacted]w[redacted]d.  Anesthesia: epidural. Postpartum course has been uncomplicated. Baby Iris is doing well. Baby is feeding by bottle - Similac Sensitive RS. Bleeding stopped after about 3 weeks. Sabrinia had a period about 10 days ago. Bowel function is normal. Bladder function is normal. Patient is not sexually active. Contraception method is oral progesterone-only contraceptive. Postpartum depression screening: negative.   The pregnancy intention screening data noted above was reviewed. Potential methods of contraception were discussed. The patient elected to proceed with No data recorded.   Edinburgh Postnatal Depression Scale - 07/06/22 0855       Edinburgh Postnatal Depression Scale:  In the Past 7 Days   I have been able to laugh and see the funny side of things. 0    I have looked forward with enjoyment to things. 0    I have blamed myself unnecessarily when things went wrong. 0    I have been anxious or worried for no good reason. 0    I have felt scared or panicky for no good reason. 0    Things have been getting on top of me. 1    I have been so unhappy that I have had difficulty sleeping. 0    I have felt sad or miserable. 1    I have been so unhappy that I have been crying. 0    The thought of harming myself has occurred to me. 0    Edinburgh Postnatal Depression Scale Total 2             Health Maintenance Due  Topic Date Due   COVID-19 Vaccine (4 - Moderna series) 09/27/2020   COLONOSCOPY (Pts 45-24yrs Insurance coverage will need to be confirmed)  Never done   INFLUENZA VACCINE  04/27/2022    The following portions of the patient's history were reviewed and updated as appropriate: allergies,  current medications, past family history, past medical history, past social history, past surgical history, and problem list.  Review of Systems Pertinent items are noted in HPI.  Objective:  BP (!) 130/91   Pulse 78   Ht 5\' 5"  (1.651 m)   Wt 251 lb (113.9 kg)   LMP 06/27/2022 (Approximate)   Breastfeeding No   BMI 41.77 kg/m    General:  alert, cooperative, and appears stated age   Breasts:  not indicated  Lungs: clear to auscultation bilaterally  Heart:  regular rate and rhythm, S1, S2 normal, no murmur, click, rub or gallop  Abdomen: soft, non-tender; bowel sounds normal; no masses,  no organomegaly   Wound : lacerations well-healed  GU exam:  normal       Assessment:   8 weeks postpartum  Normal postpartum exam.   Plan:   Essential components of care per ACOG recommendations:  1.  Mood and well being: Patient with negative depression screening today. EPDS 2. - Patient tobacco use? No.   - hx of drug use? No.    2. Infant care and feeding:  -Patient currently breastmilk feeding? No.  -Social determinants of health (SDOH) reviewed in EPIC. No concerns  3. Sexuality, contraception and birth spacing - Patient does not want a pregnancy in the next year.  Desired family  size is 2 children.  - Reviewed reproductive life planning. Reviewed contraceptive methods based on pt preferences and effectiveness.  Patient desired Oral Contraceptive today.   - Discussed birth spacing of 18 months  4. Sleep and fatigue -Encouraged family/partner/community support of 4 hrs of uninterrupted sleep to help with mood and fatigue  5. Physical Recovery  - Discussed patients delivery and complications. She describes her labor as good. - Patient had a  vaginal delivery with retained placenta (manually removed by Dr. Valentino Saxon) . Patient had a  labial and 2nd degree vaginal  laceration. Perineal healing reviewed. Patient expressed understanding - Patient has urinary incontinence? No. - Patient  is safe to resume physical and sexual activity  6.  Health Maintenance - HM due items addressed Yes. Mammogram ordered. Initial BP elevated, repeat WNL - Last pap smear  Diagnosis  Date Value Ref Range Status  04/09/2022 - Low grade squamous intraepithelial lesion (LSIL) (A)  Final   Pap smear not done at today's visit. Needs colposcopy. -Breast Cancer screening indicated? Yes. Patient referred today for mammogram.   7. Chronic Disease/Pregnancy Condition follow up: Abnormal pap smear  - PCP follow up  Glenetta Borg, CNM [facility] [place], [facility]

## 2022-08-30 NOTE — Progress Notes (Deleted)
    GYNECOLOGY OFFICE COLPOSCOPY PROCEDURE NOTE  46 y.o. G2P1001 here for colposcopy for low-grade squamous intraepithelial neoplasia (LGSIL - encompassing HPV,mild dysplasia,CIN I) with positive HPV pap smear on 04/09/2022. Discussed role for HPV in cervical dysplasia, need for surveillance.  Patient gave informed written consent, time out was performed.  Placed in lithotomy position. Cervix viewed with speculum and colposcope after application of acetic acid.   Colposcopy adequate? {yes/no:20286}  {Findings; colposcopy:728}; corresponding biopsies obtained.  ECC specimen obtained. All specimens were labeled and sent to pathology.  Chaperone was present during entire procedure.  Patient was given post procedure instructions.  Will follow up pathology and manage accordingly; patient will be contacted with results and recommendations.  Routine preventative health maintenance measures emphasized.    Hildred Laser, MD [facility]

## 2022-08-31 ENCOUNTER — Ambulatory Visit: Payer: BC Managed Care – PPO | Admitting: Obstetrics and Gynecology

## 2022-09-03 ENCOUNTER — Other Ambulatory Visit: Payer: Self-pay | Admitting: Obstetrics and Gynecology

## 2022-09-03 DIAGNOSIS — Z3A17 17 weeks gestation of pregnancy: Secondary | ICD-10-CM

## 2022-09-03 DIAGNOSIS — Z3482 Encounter for supervision of other normal pregnancy, second trimester: Secondary | ICD-10-CM

## 2022-09-13 ENCOUNTER — Encounter: Payer: Self-pay | Admitting: Psychiatry

## 2022-09-13 ENCOUNTER — Telehealth: Payer: BC Managed Care – PPO | Admitting: Psychiatry

## 2022-09-14 ENCOUNTER — Telehealth: Payer: BC Managed Care – PPO | Admitting: Psychiatry

## 2022-09-14 ENCOUNTER — Encounter: Payer: Self-pay | Admitting: Psychiatry

## 2022-09-14 DIAGNOSIS — F4001 Agoraphobia with panic disorder: Secondary | ICD-10-CM | POA: Diagnosis not present

## 2022-09-14 DIAGNOSIS — F902 Attention-deficit hyperactivity disorder, combined type: Secondary | ICD-10-CM | POA: Diagnosis not present

## 2022-09-14 DIAGNOSIS — F319 Bipolar disorder, unspecified: Secondary | ICD-10-CM

## 2022-09-14 MED ORDER — LURASIDONE HCL 120 MG PO TABS: 120.0000 mg | ORAL_TABLET | Freq: Every day | ORAL | 2 refills | Status: DC

## 2022-09-14 MED ORDER — LAMOTRIGINE 100 MG PO TABS: 200.0000 mg | ORAL_TABLET | Freq: Every day | ORAL | 1 refills | Status: DC

## 2022-09-14 NOTE — Progress Notes (Signed)
Lisa Schroeder [id number] 06/26/1976 46 y.o.     Subjective:   Patient ID:  Lisa Schroeder is a 46 y.o. (DOB [birth year]) female.  Chief Complaint:  Chief Complaint  Patient presents with   Follow-up    Bipolar I disorder [facility])    HPI Lisa Schroeder presents to the office today for follow-up of bipolar disorder and anxiety disorders.  visit June 2020.  She could not afford Metadate so she was given a prescription for Concerta 36 mg daily.  No other med changes.  She remained on Abilify 5 mg daily, lamotrigine 200 mg daily.   She called May 22, 2019 stating that she was having panic attacks and asked for prescription for Ativan.  It was okayed for her to use sparingly.   seen October 05, 2019.  She described a pattern of hypersexuality as well as some other manic symptoms that she and her therapist had discussed.  We decided to switch from Abilify to Depakote as a mood stabilizer.   11/16/2019 appointment with the following noted: Did switch sx.  Episodes mania are gone but more depression.  Couple weeks ago didn't want to leave the house.  Abilify did better with the depression. No manic sx in the last 3 weeks.  No further hypersexuality resolved.  Sleeping well.  Some racing thoughts but better.  Not irritable..  Some anxiety situational.   Concerta affordable. 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   Doing well without major issues.    Last visit changed to Metadate CD and that worked well but over $100.  Better energy and focus and more productive.  Tolerated well.  Ran out.  Dose seems good. Does ok without stimulant but better focus and energy and happier on it.  Be nice if insurance would cover it. Plan:Unfortunately she's been more depressed and has a tremor. Increase lamotrigine to 150 mg daily to help with depression BC tremor reduce Depakote to 750 mg HS.  Disc risk return of mania. Follow-up 8 weeks.   March 28, 2020  appointment with the following noted Fine.  Hardly ever take Concerta bc it caused the tremor. Disc goal to get pregnant and need to get off Depakote. Rare panic.  Tolerates Ativan.  Ok off Concerta. Not depressed.  No manic behavior. Plan: Wean Depakote for pregnancy over 10 days.  Disc risk of this causing birth defects etc.  Disc risk mood swings without it.  Call. First step would be to increase lamotrigine.   TC 06/06/20 Pt with rapid mood swings off Depakote.  Might be pregnant.  Needs another stabilizer.  Took Latuda before but stopped DT cost.  Agrees to restart bc it's one of safest in pregnancy.  Will send in RX for 40 mg Latuda.   07/02/2020 appointment with the following noted: Not pregnant.  "I feel 10 times better" in a few days.  Happier and more productive.  No crying anymore. No significant anger like before.  Sleep is good. Last Friday was dizzy and told she could be having SE.  He told her to cut Latuda to 20 and then increase it back to 40 mg.  Back on 40 mg for 4-5 days.   Plan: Off Depakote and on Latuda & lamotrigine  bc pursuing pregnancy and cannot have stability without medication.  10/01/2020 appointment with the following noted: Mood pretty stable except occ moodiness with PMS.  No sig depression.  Anxiety manageable with 2-3  Ativan including when flying.  No major problems.  No SE. Pursuing pregnancy.   Plan: no changes  02/19/21 appt noted: Recent anger outbursts a couple of times per week.  Including today at work which is unusual..  2 weeks depression and didn't want to get out of bed but also on menstrual cycle.  Lasted a day and resolved. Hungry lately.  No change in meds.  Energy seems down overall.  No particular trigger for these changes.  Says she can handle fatigue better than she can handle the mood swings. Plan: Because of recent mood swings increase Latuda from 40 mg to 60 mg daily.   07/20/2021 appointment with the following noted: Increase Latuda  seemed to help with less emotional outbursts.  Manageable now.  Ativan helps if too stressed out. No SE. Consistent with Latuda 60 and Lamotrigine 200 mg daily. Plan: No med changes Continue Latuda 60 and lamotrigine 200 mg daily.  02/11/22 appt noted: Pregnant [redacted] weeks.  [facility]. Pregnancy going well.  WILL BREASTFEED 3 MOS. Mood pretty good.  Some outbursts of anger only around H and now separated and now is fine. Separated since early Feb. No anger otherwise. Separated in the past and had a few therapy sessions.  Difference of opinion and conflict over her 45 yo daughter. HX REALLY BAD POST-PARTUM DEPRESSION BUT NO HI OR SI AND HIT DAY AFTER SHE WAS BORN Patient reports stable mood and denies depressed or irritable moods.  Patient denies any recent difficulty with anxiety.  Patient denies difficulty with sleep initiation or maintenance. Denies appetite disturbance. .  Patient denies any difficulty with concentration.  Patient denies any suicidal ideation.  06/11/22 appt noted: Continues Latuda 60 and lamotrigine 200 mg daily. [name] born 1 month ago today.  Delivery unremarkable and was induced. Iris is healthy.  Issues with breast feeding not enough milk so mostly bottle feeding.  Nurses several times daily.   Growing and normal. Done surprisingly well without sig depression. No bonding problems. Separated and so night care by herself.   Tolerating meds well.   Asks about Concerta.    09/14/22 appt noted: Stopped Concerta bc interfered with napping and didn't help enough.  Doing ok without it. Mood is fine.  No sig PPD.  Some questions about having baby at her age but talks with therapist with it. Sleep is pretty good.  D sleep through the night. No mood swings, panic nor fear. No obsessive fear about baby.  Normal worry.   No SE issues.   No concerns with meds. Having PT for back and better.   1 D 45yo.  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   Past Psychiatric Medication  Trials: Citalopram, venlafaxine, Wellbutrin,  lamotrigine 200, Depakote fair response Latuda 60 good response, Vraylar side effects, Abilify 5,   Rexulti Ritalin, concerta    Review of Systems:  Review of Systems  Constitutional:  Negative for fatigue.  Cardiovascular:  Negative for chest pain and palpitations.  Neurological:  Negative for tremors.  Psychiatric/Behavioral:  Negative for dysphoric mood.     Medications: I have reviewed the patient's current medications.  Current Outpatient Medications  Medication Sig Dispense Refill   levothyroxine (SYNTHROID) 50 MCG tablet Take by mouth.     norethindrone (MICRONOR) 0.35 MG tablet Take 1 tablet (0.35 mg total) by mouth daily. 30 tablet 11   Prenatal Vit-Fe Fumarate-FA (M-NATAL PLUS) 27-1 MG TABS TAKE 1 TABLET BY MOUTH EVERY DAY 90 tablet 1  lamoTRIgine (LAMICTAL) 100 MG tablet Take 2 tablets (200 mg total) by mouth daily. 180 tablet 1   levothyroxine (SYNTHROID) 50 MCG tablet Take by mouth.     Lurasidone HCl (LATUDA) 120 MG TABS Take 1 tablet (120 mg total) by mouth daily at 12 noon. 30 tablet 2   No current facility-administered medications for this visit.    Medication Side Effects as noted  Allergies: No Known Allergies  Past Medical History:  Diagnosis Date   Abnormal Pap smear of cervix    Depression    HPV in female    Hypothyroidism     Family History  Problem Relation Age of Onset   COPD Mother    Thyroid disease Mother    Pulmonary fibrosis Mother    Hypertension Father    Diabetes Father    Heart disease Father    Cancer Paternal Uncle    Diabetes Paternal Grandfather    Heart disease Paternal Grandfather    Breast cancer Neg Hx    Cervical cancer Neg Hx    Colon cancer Neg Hx    Asthma Neg Hx    Kidney disease Neg Hx    Stroke Neg Hx     Social History   Socioeconomic History   Marital status: Married    Spouse name: Casimiro Needle   Number of children: 1   Years of education: Not on file    Highest education level: Not on file  Occupational History   Not on file  Tobacco Use   Smoking status: Former    Types: Cigarettes    Quit date: 01/03/2018    Years since quitting: 4.6   Smokeless tobacco: Never  Vaping Use   Vaping Use: Former   Quit date: 04/08/2020   Substances: Nicotine  Substance and Sexual Activity   Alcohol use: Not Currently    Comment: not while preg   Drug use: No   Sexual activity: Yes    Birth control/protection: Surgical    Comment: Undecided  Other Topics Concern   Not on file  Social History Narrative   27 year old daughter    Social Determinants of Health   Financial Resource Strain: Not on file  Food Insecurity: Not on file  Transportation Needs: Not on file  Physical Activity: Not on file  Stress: Not on file  Social Connections: Not on file  Intimate Partner Violence: Not on file    Past Medical History, Surgical history, Social history, and Family history were reviewed and updated as appropriate.   Please see review of systems for further details on the patient's review from today.   Objective:   Physical Exam:  There were no vitals taken for this visit.  Physical Exam Neurological:     Mental Status: She is alert and oriented to person, place, and time.     Cranial Nerves: No dysarthria.  Psychiatric:        Attention and Perception: Attention and perception normal.        Mood and Affect: Mood normal. Mood is not anxious or depressed.        Speech: Speech normal.        Behavior: Behavior is cooperative.        Thought Content: Thought content normal. Thought content is not paranoid or delusional. Thought content does not include homicidal or suicidal ideation. Thought content does not include suicidal plan.        Cognition and Memory: Cognition and memory normal.  Judgment: Judgment normal.     Comments: Insight intact     Lab Review:     Component Value Date/Time   NA 139 12/26/2018 0145   NA 141  01/17/2017 1714   K 3.9 12/26/2018 0145   CL 106 12/26/2018 0145   CO2 20 (L) 12/26/2018 0145   GLUCOSE 141 (H) 12/26/2018 0145   BUN 11 12/26/2018 0145   BUN 10 01/17/2017 1714   CREATININE 0.71 12/26/2018 0145   CALCIUM 8.6 (L) 12/26/2018 0145   PROT 7.1 12/26/2018 0145   PROT 7.7 01/17/2017 1714   ALBUMIN 3.6 12/26/2018 0145   ALBUMIN 4.7 01/17/2017 1714   AST 19 12/26/2018 0145   ALT 20 12/26/2018 0145   ALKPHOS 65 12/26/2018 0145   BILITOT 0.3 12/26/2018 0145   BILITOT 0.3 01/17/2017 1714   GFRNONAA >60 12/26/2018 0145   GFRAA >60 12/26/2018 0145       Component Value Date/Time   WBC 21.5 (H) 05/12/2022 0545   RBC 3.97 05/12/2022 0545   HGB 10.7 (L) 05/12/2022 0545   HGB 11.5 02/26/2022 1136   HCT 32.7 (L) 05/12/2022 0545   HCT 35.2 02/26/2022 1136   PLT 228 05/12/2022 0545   PLT 290 02/26/2022 1136   MCV 82.4 05/12/2022 0545   MCV 85 02/26/2022 1136   MCH 27.0 05/12/2022 0545   MCHC 32.7 05/12/2022 0545   RDW 14.6 05/12/2022 0545   RDW 14.3 02/26/2022 1136    No results found for: "POCLITH", "LITHIUM"   No results found for: "PHENYTOIN", "PHENOBARB", "VALPROATE", "CBMZ"   .res Assessment: Plan:    Aiyana was seen today for follow-up.  Diagnoses and all orders for this visit:  Bipolar I disorder (HCC) -     Lurasidone HCl (LATUDA) 120 MG TABS; Take 1 tablet (120 mg total) by mouth daily at 12 noon.  Panic disorder with agoraphobia  Attention deficit hyperactivity disorder (ADHD), combined type  Bipolar disorder, unspecified (HCC) -     lamoTRIgine (LAMICTAL) 100 MG tablet; Take 2 tablets (200 mg total) by mouth daily.    Overall mood stability is very good.  Patient has a history of good stability in mood with a combination of Depakote and Latuda.  Off Depakote and on Latuda bc prior pregnancy and cannot have stability without medication BC irritabilty.  Mood lability good with increase Latuda 60 mg and overall "way more stable" than with  Depakote.  Extensive discussion with great detail surrounding her goal to get pregnant in the short-term and having bipolar disorder.  Discussed the risks with and without medication.   Lamotrigine is considered relatively safe for her.  Also discussed options like Latuda but cost was a barrier with that medication.  Discussed the fact that lamotrigine is less effective at preventing mania and some of the other mood stabilizers but should provide some benefit for that purpose.  She elected to continue lamotrigine.  Kasandra Knudsen is now more affordable   Because of prior mood swings increased Latuda from 40 mg to 60 mg daily and she's remained markedly better.  Discussed the usual dosage range.  She is taking a relatively low dose to be the primary mood stabilizer. Discussed potential metabolic side effects associated with atypical antipsychotics, as well as potential risk for movement side effects. Advised pt to contact office if movement side effects occur.   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  Okay as needed lorazepam for rare panic attacks.  Needs Ativan for trips.    continue Latuda 60 and  lamotrigine 200 mg daily.  OK without stimulant.  FU 6 mos bc stable.    Meredith Staggers, MD, DFAPA Please see After Visit Summary for patient specific instructions.  Future Appointments  Date Time Provider Department Center  10/08/2022 12:20 PM [facility]  10/18/2022  3:15 PM Dove, Myra C, MD [facility] None     No orders of the defined types were placed in this encounter.   -------------------------------

## 2022-10-08 ENCOUNTER — Encounter: Payer: Self-pay | Admitting: Radiology

## 2022-10-08 ENCOUNTER — Ambulatory Visit
Admission: RE | Admit: 2022-10-08 | Discharge: 2022-10-08 | Disposition: A | Discharge status: Home / Self Care | Payer: BC Managed Care – PPO | Source: Ambulatory Visit | Attending: Obstetrics | Admitting: Obstetrics

## 2022-10-08 DIAGNOSIS — Z1231 Encounter for screening mammogram for malignant neoplasm of breast: Secondary | ICD-10-CM | POA: Insufficient documentation

## 2022-10-10 NOTE — Progress Notes (Signed)
cancelled

## 2022-10-18 ENCOUNTER — Other Ambulatory Visit (HOSPITAL_COMMUNITY)
Admission: RE | Admit: 2022-10-18 | Discharge: 2022-10-18 | Disposition: A | Discharge status: Home / Self Care | Payer: BC Managed Care – PPO | Source: Ambulatory Visit | Attending: Obstetrics and Gynecology | Admitting: Obstetrics and Gynecology

## 2022-10-18 ENCOUNTER — Encounter: Payer: Self-pay | Admitting: Obstetrics & Gynecology

## 2022-10-18 ENCOUNTER — Ambulatory Visit (INDEPENDENT_AMBULATORY_CARE_PROVIDER_SITE_OTHER): Payer: BC Managed Care – PPO | Admitting: Obstetrics & Gynecology

## 2022-10-18 VITALS — BP 136/80 | HR 87 | Resp 16 | Ht 65.0 in | Wt 244.0 lb

## 2022-10-18 DIAGNOSIS — R87612 Low grade squamous intraepithelial lesion on cytologic smear of cervix (LGSIL): Secondary | ICD-10-CM | POA: Diagnosis not present

## 2022-10-18 DIAGNOSIS — N870 Mild cervical dysplasia: Secondary | ICD-10-CM | POA: Diagnosis not present

## 2022-10-18 NOTE — Progress Notes (Signed)
   Established Patient Office Visit  Subjective   Patient ID: Lisa Schroeder, female    DOB: Oct 31, 1975  Age: 47 y.o. MRN: [id number]  No chief complaint on file.   HPI   47 yo separated P1 here for a colpo due to LGSIL pap 03/2022. She had this same pap in 2022 and 2021. She reports abnormal paps since she was about 47 yo.  She has been abstinent since prior to her vaginal delivery 04/2022. She is taking POPs but has finished breast feeding. She would like to return to Mirena IUD in the near future.  Objective:     LMP 10/08/2022    Physical Exam   No results found for any visits on 10/18/22.   Well nourished, well hydrated White female, no apparent distress She is ambulating and conversing normally. Consent signed, time out done Cervix prepped with acetic acid. Transformation zone seen in its entirety. Colpo adequate. Colpo findings all normal ECC obtained. She tolerated the procedure well.  Assessment & Plan:  Recurrent LGSIL pap- normal colpo She understands that if the ECC is abnormal, then she will need a LEEP.   She will come get Mirena at her convenience. Problem List Items Addressed This Visit   None   No follow-ups on file.    Emily Filbert, MD

## 2022-10-19 ENCOUNTER — Encounter: Payer: Self-pay | Admitting: Obstetrics & Gynecology

## 2022-10-20 LAB — SURGICAL PATHOLOGY

## 2022-10-22 ENCOUNTER — Encounter: Payer: Self-pay | Admitting: Obstetrics & Gynecology

## 2022-11-01 ENCOUNTER — Encounter: Payer: Self-pay | Admitting: Obstetrics & Gynecology

## 2022-11-05 ENCOUNTER — Ambulatory Visit: Payer: BC Managed Care – PPO | Admitting: Licensed Practical Nurse

## 2022-11-05 ENCOUNTER — Ambulatory Visit: Payer: BC Managed Care – PPO | Admitting: Obstetrics and Gynecology

## 2022-11-23 ENCOUNTER — Encounter: Payer: Self-pay | Admitting: Obstetrics & Gynecology

## 2022-11-23 ENCOUNTER — Telehealth: Payer: Self-pay | Admitting: Obstetrics & Gynecology

## 2022-11-23 ENCOUNTER — Other Ambulatory Visit (HOSPITAL_COMMUNITY)
Admission: RE | Admit: 2022-11-23 | Discharge: 2022-11-23 | Disposition: A | Discharge status: Home / Self Care | Payer: BC Managed Care – PPO | Source: Ambulatory Visit | Attending: Obstetrics & Gynecology | Admitting: Obstetrics & Gynecology

## 2022-11-23 ENCOUNTER — Ambulatory Visit (INDEPENDENT_AMBULATORY_CARE_PROVIDER_SITE_OTHER): Payer: BC Managed Care – PPO | Admitting: Obstetrics & Gynecology

## 2022-11-23 VITALS — BP 128/80 | Ht 65.0 in | Wt 234.0 lb

## 2022-11-23 DIAGNOSIS — N870 Mild cervical dysplasia: Secondary | ICD-10-CM | POA: Diagnosis present

## 2022-11-23 DIAGNOSIS — Z0000 Encounter for general adult medical examination without abnormal findings: Secondary | ICD-10-CM

## 2022-11-23 DIAGNOSIS — Z1211 Encounter for screening for malignant neoplasm of colon: Secondary | ICD-10-CM

## 2022-11-23 NOTE — Telephone Encounter (Signed)
Noted. Will order to arrive by appointment date/time. 

## 2022-11-23 NOTE — Progress Notes (Signed)
    GYNECOLOGY PROGRESS NOTE  Subjective:    Patient ID: Lisa Schroeder, female    DOB: [birth year], 47 y.o.   MRN: [id number]  HPI  Patient is a 47 y.o. G34P1001 female who presents for a LEEP. I saw her 09/2022 for a colpo due to LGSIL pap 03/2022. She has had abnormal paps since she was 47 years old.  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  She has been abstinent for about 8 months and would like to return to Mirena due the the side effect of amenorrhea that she experienced with her last Mirena.  The following portions of the patient's history were reviewed and updated as appropriate: allergies, current medications, past family history, past medical history, past social history, past surgical history, and problem list.  Review of Systems Pertinent items are noted in HPI.  Mammogram normal last month  Objective:   Blood pressure 128/80, height 5' 5"$  (1.651 m), weight 234 lb (106.1 kg), last menstrual period 11/09/2022. Body mass index is 38.94 kg/m. General appearance: alert Abdomen: soft, non-tender; bowel sounds normal; no masses,  no organomegaly Pelvic: cervix normal in appearance, external genitalia normal, urethra without abnormality or discharge, uterus normal size, shape, and consistency, and vagina normal without discharge Extremities: extremities normal, atraumatic, no cyanosis or edema Neurologic: Grossly normal   Colpo Biopsy:   Risks, benefits, alternatives, and limitations of procedure explained to patient, including pain, bleeding, infection, failure to remove abnormal tissue and failure to cure dysplasia, need for repeat procedures, damage to pelvic organs, cervical incompetence.  Role of HPV,cervical dysplasia and need for close followup was empasized. Informed written consent was obtained. All questions were answered. Time out performed. Urine pregnancy test was negative.  ??Procedure: The patient was placed in lithotomy position and the bivalved  coated speculum was placed in the patient's vagina. A grounding pad placed on the patient. Acetic acid was applied to the cervix and all appeared normal. I sprayed Hurricaine spray.  Local anesthesia was administered via an intracervical block using 15cc of 2% Lidocaine with epinephrine. The suction was turned on and the medium semicircular LEEP tip on 86 Watts of cutting current and cautery was used to excise the entire transformation zone and any areas of visible dysplasia. I obtained an ECC.  Excellent hemostasis was achieved using roller ball coagulation set at 50 Watts coagulation current. As per my usual, I coated the cone bed with Monsel's. The speculum was removed from the vagina. Specimens were sent to pathology.  ?The patient tolerated the procedure well. Post-operative instructions given to patient, including instruction to seek medical attention for persistent bright red bleeding, fever, abdominal/pelvic pain, dysuria, nausea or vomiting. She was also told about the possibility of having copious yellow to black tinged discharge for weeks. She was counseled to avoid anything in the vagina (sex/douching/tampons) for 3 weeks. She has a 4 week post-operative check to assess wound healing, review results and discuss further management.    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

## 2022-11-23 NOTE — Telephone Encounter (Signed)
Patient coming in on 01/10/2023 at 10:35 am for Mirena with Dr. Hulan Fray.

## 2022-11-24 LAB — SURGICAL PATHOLOGY

## 2022-11-25 ENCOUNTER — Encounter: Payer: Self-pay | Admitting: Obstetrics & Gynecology

## 2022-11-30 ENCOUNTER — Encounter: Payer: Self-pay | Admitting: Obstetrics & Gynecology

## 2022-12-08 ENCOUNTER — Telehealth: Payer: Self-pay | Admitting: Psychiatry

## 2022-12-08 NOTE — Telephone Encounter (Signed)
Pt lvm that she use to take ativan in the past for panic attacks. She is having them again and she would like to go back on it if dr. Clovis Pu agrees. Please call Leah at [phone]

## 2022-12-08 NOTE — Telephone Encounter (Signed)
Patient said she is having PA again and is asking for lorazepam.   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  Okay as needed lorazepam for rare panic attacks.  Needs Ativan for trips.    03/10/2021 03/10/2021 1  Lorazepam 1 Mg Tablet 30.00 10 Ca Cot

## 2022-12-09 ENCOUNTER — Other Ambulatory Visit: Payer: Self-pay

## 2022-12-09 MED ORDER — LORAZEPAM 1 MG PO TABS: 1.0000 mg | ORAL_TABLET | Freq: Three times a day (TID) | ORAL | 2 refills | Status: DC | PRN

## 2022-12-09 NOTE — Telephone Encounter (Signed)
Patient said she had stopped the lorazepam because she was pregnant. She said her mother is currently in the hospital and not doing well and she had a PA yesterday.  Will pend for Dr. Casimiro Needle review.

## 2022-12-09 NOTE — Telephone Encounter (Signed)
LVM to RC 

## 2022-12-23 ENCOUNTER — Other Ambulatory Visit: Payer: Self-pay | Admitting: Psychiatry

## 2022-12-23 DIAGNOSIS — F319 Bipolar disorder, unspecified: Secondary | ICD-10-CM

## 2023-01-01 LAB — COLOGUARD: COLOGUARD: NEGATIVE

## 2023-01-10 ENCOUNTER — Encounter: Payer: Self-pay | Admitting: Obstetrics & Gynecology

## 2023-01-10 ENCOUNTER — Ambulatory Visit (INDEPENDENT_AMBULATORY_CARE_PROVIDER_SITE_OTHER): Payer: BC Managed Care – PPO | Admitting: Obstetrics & Gynecology

## 2023-01-10 VITALS — BP 125/74 | HR 87 | Ht 65.0 in | Wt 231.0 lb

## 2023-01-10 DIAGNOSIS — Z30014 Encounter for initial prescription of intrauterine contraceptive device: Secondary | ICD-10-CM | POA: Diagnosis not present

## 2023-01-10 DIAGNOSIS — Z30430 Encounter for insertion of intrauterine contraceptive device: Secondary | ICD-10-CM

## 2023-01-10 MED ORDER — LEVONORGESTREL 20 MCG/DAY IU IUD
1.0000 | INTRAUTERINE_SYSTEM | Freq: Once | INTRAUTERINE | Status: AC
  Administered 2023-01-10: 1 via INTRAUTERINE

## 2023-01-10 NOTE — Progress Notes (Signed)
    GYNECOLOGY PROGRESS NOTE  Subjective:    Patient ID: Lisa Schroeder, female    DOB: 14-Oct-1975, 47 y.o.   MRN: [id number]  HPI  Patient is a 47 y.o. G47P6140 (27 month old and 86 yo daughters) here for Mirena insertion. She is currently taking OCPs and using condoms. She has no complaints today.  The following portions of the patient's history were reviewed and updated as appropriate: allergies, current medications, past family history, past medical history, past social history, past surgical history, and problem list.  Review of Systems Pertinent items are noted in HPI.   Objective:   Blood pressure 125/74, pulse 87, height 5\' 5"  (1.651 m), weight 231 lb (104.8 kg), last menstrual period 12/29/2022. Body mass index is 38.44 kg/m. Well nourished, well hydrated White female, no apparent distress She is ambulating and conversing normally.  Consent signed, Time out procedure done. Bimanual exam revealed a uterus NSSA, no adnexal masses or tenderness Graves speculum placed. Cervix appears well-healed after LEEP. Cervix prepped with betadine and Hurricaine spray and then grasped with a single tooth tenaculum. Mirena was easily placed and the strings were cut to 3-4 cm. Uterus sounded to 8 cm. She tolerated the procedure well.    Assessment:   1. Encounter for insertion of mirena IUD      Plan:   1. Encounter for insertion of mirena IUD - rec back up method (on OCPs and using condoms) for 2 weeks  2. She is due to a repeat pap smear in 4 months

## 2023-01-10 NOTE — Addendum Note (Signed)
Addended by: Cornelius Moras D on: 01/10/2023 10:55 AM   Modules accepted: Orders

## 2023-01-11 ENCOUNTER — Encounter: Payer: Self-pay | Admitting: Obstetrics & Gynecology

## 2023-03-16 ENCOUNTER — Telehealth (INDEPENDENT_AMBULATORY_CARE_PROVIDER_SITE_OTHER): Payer: BC Managed Care – PPO | Admitting: Psychiatry

## 2023-03-16 ENCOUNTER — Encounter: Payer: Self-pay | Admitting: Psychiatry

## 2023-03-16 DIAGNOSIS — F902 Attention-deficit hyperactivity disorder, combined type: Secondary | ICD-10-CM | POA: Diagnosis not present

## 2023-03-16 DIAGNOSIS — F319 Bipolar disorder, unspecified: Secondary | ICD-10-CM | POA: Diagnosis not present

## 2023-03-16 DIAGNOSIS — F4001 Agoraphobia with panic disorder: Secondary | ICD-10-CM

## 2023-03-16 MED ORDER — LURASIDONE HCL 120 MG PO TABS: 120.0000 mg | ORAL_TABLET | Freq: Every day | ORAL | 0 refills | Status: DC

## 2023-03-16 MED ORDER — LAMOTRIGINE 100 MG PO TABS: 200.0000 mg | ORAL_TABLET | Freq: Every day | ORAL | 1 refills | Status: DC

## 2023-03-16 NOTE — Progress Notes (Signed)
Lisa Schroeder [id number] 1976/01/30 46 y.o.     Subjective:   Patient ID:  Lisa Schroeder is a 47 y.o. (DOB 1976-05-15) female.  Chief Complaint:  Chief Complaint  Patient presents with   Follow-up    Mood and anxiety    HPI Lisa Schroeder presents to the office today for follow-up of bipolar disorder and anxiety disorders.  visit June 2020.  She could not afford Metadate so she was given a prescription for Concerta 36 mg daily.  No other med changes.  She remained on Abilify 5 mg daily, lamotrigine 200 mg daily.   She called May 22, 2019 stating that she was having panic attacks and asked for prescription for Ativan.  It was okayed for her to use sparingly.   seen October 05, 2019.  She described a pattern of hypersexuality as well as some other manic symptoms that she and her therapist had discussed.  We decided to switch from Abilify to Depakote as a mood stabilizer.   11/16/2019 appointment with the following noted: Did switch sx.  Episodes mania are gone but more depression.  Couple weeks ago didn't want to leave the house.  Abilify did better with the depression. No manic sx in the last 3 weeks.  No further hypersexuality resolved.  Sleeping well.  Some racing thoughts but better.  Not irritable..  Some anxiety situational.   Concerta affordable. 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   Doing well without major issues.    Last visit changed to Metadate CD and that worked well but over $100.  Better energy and focus and more productive.  Tolerated well.  Ran out.  Dose seems good. Does ok without stimulant but better focus and energy and happier on it.  Be nice if insurance would cover it. Plan:Unfortunately she's been more depressed and has a tremor. Increase lamotrigine to 150 mg daily to help with depression BC tremor reduce Depakote to 750 mg HS.  Disc risk return of mania. Follow-up 8 weeks.   March 28, 2020  appointment with the following noted Fine.  Hardly ever take Concerta bc it caused the tremor. Disc goal to get pregnant and need to get off Depakote. Rare panic.  Tolerates Ativan.  Ok off Concerta. Not depressed.  No manic behavior. Plan: Wean Depakote for pregnancy over 10 days.  Disc risk of this causing birth defects etc.  Disc risk mood swings without it.  Call. First step would be to increase lamotrigine.   TC 06/06/20 Pt with rapid mood swings off Depakote.  Might be pregnant.  Needs another stabilizer.  Took Latuda before but stopped DT cost.  Agrees to restart bc it's one of safest in pregnancy.  Will send in RX for 40 mg Latuda.   07/02/2020 appointment with the following noted: Not pregnant.  "I feel 10 times better" in a few days.  Happier and more productive.  No crying anymore. No significant anger like before.  Sleep is good. Last Friday was dizzy and told she could be having SE.  He told her to cut Latuda to 20 and then increase it back to 40 mg.  Back on 40 mg for 4-5 days.   Plan: Off Depakote and on Latuda & lamotrigine  bc pursuing pregnancy and cannot have stability without medication.  10/01/2020 appointment with the following noted: Mood pretty stable except occ moodiness with PMS.  No sig depression.  Anxiety manageable with 2-3 Ativan  including when flying.  No major problems.  No SE. Pursuing pregnancy.   Plan: no changes  02/19/21 appt noted: Recent anger outbursts a couple of times per week.  Including today at work which is unusual..  2 weeks depression and didn't want to get out of bed but also on menstrual cycle.  Lasted a day and resolved. Hungry lately.  No change in meds.  Energy seems down overall.  No particular trigger for these changes.  Says she can handle fatigue better than she can handle the mood swings. Plan: Because of recent mood swings increase Latuda from 40 mg to 60 mg daily.   07/20/2021 appointment with the following noted: Increase Latuda  seemed to help with less emotional outbursts.  Manageable now.  Ativan helps if too stressed out. No SE. Consistent with Latuda 60 and Lamotrigine 200 mg daily. Plan: No med changes Continue Latuda 60 and lamotrigine 200 mg daily.  02/11/22 appt noted: Pregnant [redacted] weeks.  [facility] 05/10/22. gIRL. Pregnancy going well.  WILL BREASTFEED 3 MOS. Mood pretty good.  Some outbursts of anger only around H and now separated and now is fine. Separated since early Feb. No anger otherwise. Separated in the past and had a few therapy sessions.  Difference of opinion and conflict over her 72 yo daughter. HX REALLY BAD POST-PARTUM DEPRESSION BUT NO HI OR SI AND HIT DAY AFTER SHE WAS BORN Patient reports stable mood and denies depressed or irritable moods.  Patient denies any recent difficulty with anxiety.  Patient denies difficulty with sleep initiation or maintenance. Denies appetite disturbance. .  Patient denies any difficulty with concentration.  Patient denies any suicidal ideation.  06/11/22 appt noted: Continues Latuda 60 and lamotrigine 200 mg daily. [name] born 1 month ago today.  Delivery unremarkable and was induced. Iris is healthy.  Issues with breast feeding not enough milk so mostly bottle feeding.  Nurses several times daily.   Growing and normal. Done surprisingly well without sig depression. No bonding problems. Separated and so night care by herself.   Tolerating meds well.   Asks about Concerta.    09/14/22 appt noted: Stopped Concerta bc interfered with napping and didn't help enough.  Doing ok without it. Mood is fine.  No sig PPD.  Some questions about having baby at her age but talks with therapist with it. Sleep is pretty good.  D sleep through the night. No mood swings, panic nor fear. No obsessive fear about baby.  Normal worry.   No SE issues.   No concerns with meds. Having PT for back and better.  12/08/22 TC with panic.  Needed prn lorazepam again.  03/16/23 appt  noted: Continues Latuda 60 and lamotrigine 200 mg daily. March mother in hosp and needed lorazepam used rarely. Couple weeks ago really bad dep day triggered but usually handled that kind of issue well. No sig SI.  Dep lasted about 36 hours after bad for 24 hours.  Had to see therapist immediately and this was only suc episode to occur in recent past.   No avoidance.   D 44 mos old and doing well. Never breast fed since she was 31 weeks old.  No sign manic periods or anger , irritability or ohther mood swings. No SE Lost 30# intentionally.   1 D 46yo.  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   Past Psychiatric Medication Trials: Citalopram, venlafaxine, Wellbutrin,  lamotrigine 200, Depakote fair response Latuda 60 good response, Vraylar side effects, Abilify 5,  Rexulti Ritalin, concerta    Review of Systems:  Review of Systems  Constitutional:  Negative for fatigue.  Cardiovascular:  Negative for chest pain and palpitations.  Neurological:  Negative for tremors and weakness.  Psychiatric/Behavioral:  Negative for dysphoric mood.     Medications: I have reviewed the patient's current medications.  Current Outpatient Medications  Medication Sig Dispense Refill   levonorgestrel (MIRENA) 20 MCG/DAY IUD 1 each by Intrauterine route once.     levothyroxine (SYNTHROID) 50 MCG tablet Take by mouth.     LORazepam (ATIVAN) 1 MG tablet Take 1 tablet (1 mg total) by mouth every 8 (eight) hours as needed for anxiety. 30 tablet 2   meloxicam (MOBIC) 7.5 MG tablet Take 1 tablet by mouth daily.     norethindrone (MICRONOR) 0.35 MG tablet Take 1 tablet (0.35 mg total) by mouth daily. 30 tablet 11   lamoTRIgine (LAMICTAL) 100 MG tablet Take 2 tablets (200 mg total) by mouth daily. 180 tablet 1   Lurasidone HCl 120 MG TABS Take 1 tablet (120 mg total) by mouth daily at 12 noon. 90 tablet 0   No current facility-administered medications for this visit.    Medication Side Effects as  noted  Allergies: No Known Allergies  Past Medical History:  Diagnosis Date   Abnormal Pap smear of cervix    Depression    HPV in female    Hypothyroidism     Family History  Problem Relation Age of Onset   COPD Mother    Thyroid disease Mother    Pulmonary fibrosis Mother    Hypertension Father    Diabetes Father    Heart disease Father    Cancer Paternal Uncle    Diabetes Paternal Grandfather    Heart disease Paternal Grandfather    Breast cancer Neg Hx    Cervical cancer Neg Hx    Colon cancer Neg Hx    Asthma Neg Hx    Kidney disease Neg Hx    Stroke Neg Hx     Social History   Socioeconomic History   Marital status: Married    Spouse name: Casimiro Needle   Number of children: 1   Years of education: Not on file   Highest education level: Not on file  Occupational History   Not on file  Tobacco Use   Smoking status: Former    Types: Cigarettes    Quit date: 01/03/2018    Years since quitting: 5.2   Smokeless tobacco: Never  Vaping Use   Vaping Use: Former   Quit date: 04/08/2020   Substances: Nicotine  Substance and Sexual Activity   Alcohol use: Not Currently    Comment: not while preg   Drug use: No   Sexual activity: Yes    Birth control/protection: Pill    Comment: Undecided  Other Topics Concern   Not on file  Social History Narrative   52 year old daughter    Social Determinants of Health   Financial Resource Strain: Not on file  Food Insecurity: Not on file  Transportation Needs: Not on file  Physical Activity: Not on file  Stress: Not on file  Social Connections: Not on file  Intimate Partner Violence: Not on file    Past Medical History, Surgical history, Social history, and Family history were reviewed and updated as appropriate.   Please see review of systems for further details on the patient's review from today.   Objective:   Physical Exam:  There were no  vitals taken for this visit.  Physical Exam Neurological:     Mental  Status: She is alert and oriented to person, place, and time.     Cranial Nerves: No dysarthria.  Psychiatric:        Attention and Perception: Attention and perception normal.        Mood and Affect: Mood normal. Mood is not anxious or depressed.        Speech: Speech normal.        Behavior: Behavior is cooperative.        Thought Content: Thought content normal. Thought content is not paranoid or delusional. Thought content does not include homicidal or suicidal ideation. Thought content does not include suicidal plan.        Cognition and Memory: Cognition and memory normal.        Judgment: Judgment normal.     Comments: Insight intact     Lab Review:     Component Value Date/Time   NA 139 12/26/2018 0145   NA 141 01/17/2017 1714   K 3.9 12/26/2018 0145   CL 106 12/26/2018 0145   CO2 20 (L) 12/26/2018 0145   GLUCOSE 141 (H) 12/26/2018 0145   BUN 11 12/26/2018 0145   BUN 10 01/17/2017 1714   CREATININE 0.71 12/26/2018 0145   CALCIUM 8.6 (L) 12/26/2018 0145   PROT 7.1 12/26/2018 0145   PROT 7.7 01/17/2017 1714   ALBUMIN 3.6 12/26/2018 0145   ALBUMIN 4.7 01/17/2017 1714   AST 19 12/26/2018 0145   ALT 20 12/26/2018 0145   ALKPHOS 65 12/26/2018 0145   BILITOT 0.3 12/26/2018 0145   BILITOT 0.3 01/17/2017 1714   GFRNONAA >60 12/26/2018 0145   GFRAA >60 12/26/2018 0145       Component Value Date/Time   WBC 21.5 (H) 05/12/2022 0545   RBC 3.97 05/12/2022 0545   HGB 10.7 (L) 05/12/2022 0545   HGB 11.5 02/26/2022 1136   HCT 32.7 (L) 05/12/2022 0545   HCT 35.2 02/26/2022 1136   PLT 228 05/12/2022 0545   PLT 290 02/26/2022 1136   MCV 82.4 05/12/2022 0545   MCV 85 02/26/2022 1136   MCH 27.0 05/12/2022 0545   MCHC 32.7 05/12/2022 0545   RDW 14.6 05/12/2022 0545   RDW 14.3 02/26/2022 1136    No results found for: "POCLITH", "LITHIUM"   No results found for: "PHENYTOIN", "PHENOBARB", "VALPROATE", "CBMZ"   .res Assessment: Plan:    Lisa Schroeder was seen today for  follow-up.  Diagnoses and all orders for this visit:  Bipolar I disorder (HCC) -     Lurasidone HCl 120 MG TABS; Take 1 tablet (120 mg total) by mouth daily at 12 noon.  Panic disorder with agoraphobia  Attention deficit hyperactivity disorder (ADHD), combined type  Bipolar disorder, unspecified (HCC) -     lamoTRIgine (LAMICTAL) 100 MG tablet; Take 2 tablets (200 mg total) by mouth daily.    Overall mood stability is very good.  Patient has a history of good stability in mood with a combination of Depakote and Latuda.  Off Depakote and on Latuda bc prior pregnancy and cannot have stability without medication BC irritabilty.  Mood lability good with increase Latuda 60 mg and overall "way more stable" than with Depakote.  Good resp with lamotrigine/Latuda combo.   Took meds with preg and D is 48 mos old and doing well.  Not breastfeeding.   Because of prior mood swings increased Latuda from 40 mg to 60 mg daily and  she's remained markedly better.  Discussed the usual dosage range.  She is taking a relatively low dose to be the primary mood stabilizer.  Discussed potential metabolic side effects associated with atypical antipsychotics, as well as potential risk for movement side effects. Advised pt to contact office if movement side effects occur.   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  Okay as needed lorazepam for rare panic attacks.  Needs Ativan for trips.    continue Latuda 60 and lamotrigine 200 mg daily. No med changes indicated.    FU 6 mos bc stable.    Meredith Staggers, MD, DFAPA Please see After Visit Summary for patient specific instructions.  No future appointments.    No orders of the defined types were placed in this encounter.   -------------------------------

## 2023-06-06 ENCOUNTER — Ambulatory Visit: Payer: BC Managed Care – PPO | Admitting: Obstetrics & Gynecology

## 2023-06-06 ENCOUNTER — Other Ambulatory Visit (HOSPITAL_COMMUNITY)
Admission: RE | Admit: 2023-06-06 | Discharge: 2023-06-06 | Disposition: A | Discharge status: Home / Self Care | Payer: BC Managed Care – PPO | Source: Ambulatory Visit | Attending: Obstetrics & Gynecology | Admitting: Obstetrics & Gynecology

## 2023-06-06 ENCOUNTER — Encounter: Payer: Self-pay | Admitting: Obstetrics & Gynecology

## 2023-06-06 VITALS — BP 108/73 | HR 76 | Ht 65.0 in | Wt 220.0 lb

## 2023-06-06 DIAGNOSIS — Z6836 Body mass index (BMI) 36.0-36.9, adult: Secondary | ICD-10-CM

## 2023-06-06 DIAGNOSIS — Z30431 Encounter for routine checking of intrauterine contraceptive device: Secondary | ICD-10-CM | POA: Diagnosis not present

## 2023-06-06 DIAGNOSIS — Z124 Encounter for screening for malignant neoplasm of cervix: Secondary | ICD-10-CM | POA: Insufficient documentation

## 2023-06-06 DIAGNOSIS — E6609 Other obesity due to excess calories: Secondary | ICD-10-CM | POA: Diagnosis not present

## 2023-06-06 DIAGNOSIS — Z87410 Personal history of cervical dysplasia: Secondary | ICD-10-CM | POA: Insufficient documentation

## 2023-06-06 NOTE — Progress Notes (Signed)
    GYNECOLOGY PROGRESS NOTE  Subjective:    Patient ID: Lisa Schroeder, female    DOB: [birth year], 47 y.o.   MRN: [id number]  HPI  Patient is a 47 y.o. G60P1001 female who presents for repeat pap after LEEP in 10/2022.  She cannot feel her Mirena strings and wants to make sure that it is still in.  She is also interested in weight loss (BMI 36 and A1C is 6).  The pathology from her LEEP showed the following:  FINAL MICROSCOPIC DIAGNOSIS:   A. CERVIX, LEEP:  - Low-grade squamous intraepithelial lesion (CIN1, low grade dysplasia)  - The ectocervical margin is positive for low-grade dysplasia  - Negative for high-grade dysplasia   B. ENDOCERVIX, CURETTAGE:  - Koilocytic atypia, consistent with low-grade squamous intraepithelial  lesion (CIN1, low-grade dysplasia)  - Benign cervical glandular mucosa    The following portions of the patient's history were reviewed and updated as appropriate: allergies, current medications, past family history, past medical history, past social history, past surgical history, and problem list.  Review of Systems Pertinent items are noted in HPI.   Objective:   Blood pressure 108/73, pulse 76, height 5\' 5"  (1.651 m), weight 220 lb (99.8 kg), last menstrual period 06/03/2023. Body mass index is 36.61 kg/m.  Well nourished, well hydrated White female, no apparent distress She is ambulating and conversing normally.  EG- normal Cervix- well-healed after LEEP, old brown mucous noted IUD strings tucked up inside cervix. They came down out of the cervix after I used the broom to obtain a pap smear, about 1 cm of string coming out of cervix.  Assessment:   Follow up pap smear IUD check Desire for weight loss- She has lost 35 pounds over the last year with [employer]. We discussed how this is fantastic! I don't think she needs a nutrition consult but I have given her good reassurance about her continued behaviour changes to lose weight slowly  and consistently.  Plan:   I will base future cervical screening on pap smear results. Plan for annual exam in a year.

## 2023-06-09 LAB — CYTOLOGY - PAP
Comment: NEGATIVE
Diagnosis: NEGATIVE
High risk HPV: NEGATIVE

## 2023-07-18 ENCOUNTER — Telehealth: Payer: Self-pay

## 2023-07-18 ENCOUNTER — Encounter: Payer: Self-pay | Admitting: Obstetrics & Gynecology

## 2023-07-18 NOTE — Telephone Encounter (Addendum)
Nipples looks different since giving birth 14 months ago. She was evaluated by Dermatologist, they could not seem to give her any answers about it. She has not breast feed daughter since she was 16 weeks old and if she squeezes on her nipples she still has a small amount of milk coming out of them. Is this normal.  IUD was placed in April and she continues to have menstrual cycles lasting anywhere from 10-14 days. Please advise.

## 2023-07-19 ENCOUNTER — Other Ambulatory Visit: Payer: Self-pay | Admitting: Obstetrics & Gynecology

## 2023-07-19 MED ORDER — NORETHINDRONE 0.35 MG PO TABS: 1.0000 | ORAL_TABLET | Freq: Every day | ORAL | 11 refills | Status: DC

## 2023-07-19 NOTE — Progress Notes (Signed)
I refilled her micronor to hopefully help the bleeding she is having with the newly placed Mirena.

## 2023-09-05 ENCOUNTER — Ambulatory Visit: Payer: BC Managed Care – PPO | Admitting: Psychiatry

## 2023-09-05 ENCOUNTER — Encounter: Payer: Self-pay | Admitting: Psychiatry

## 2023-09-05 DIAGNOSIS — F4001 Agoraphobia with panic disorder: Secondary | ICD-10-CM | POA: Diagnosis not present

## 2023-09-05 DIAGNOSIS — F902 Attention-deficit hyperactivity disorder, combined type: Secondary | ICD-10-CM

## 2023-09-05 DIAGNOSIS — F319 Bipolar disorder, unspecified: Secondary | ICD-10-CM | POA: Diagnosis not present

## 2023-09-05 MED ORDER — LURASIDONE HCL 120 MG PO TABS: 120.0000 mg | ORAL_TABLET | Freq: Every day | ORAL | 0 refills | Status: DC

## 2023-09-05 MED ORDER — LORAZEPAM 1 MG PO TABS: 1.0000 mg | ORAL_TABLET | Freq: Three times a day (TID) | ORAL | 2 refills | Status: DC | PRN

## 2023-09-05 MED ORDER — LAMOTRIGINE 100 MG PO TABS: 200.0000 mg | ORAL_TABLET | Freq: Every day | ORAL | 1 refills | Status: DC

## 2023-09-05 NOTE — Progress Notes (Signed)
Lisa Schroeder [id number] 22-Aug-1976 47 y.o.     Subjective:   Patient ID:  Lisa Schroeder is a 47 y.o. (DOB Nov 18, 1975) female.  Chief Complaint:  Chief Complaint  Patient presents with   Follow-up    HPI Lisa Schroeder presents to the office today for follow-up of bipolar disorder and anxiety disorders.  visit June 2020.  She could not afford Metadate so she was given a prescription for Concerta 36 mg daily.  No other med changes.  She remained on Abilify 5 mg daily, lamotrigine 200 mg daily.   She called May 22, 2019 stating that she was having panic attacks and asked for prescription for Ativan.  It was okayed for her to use sparingly.   seen October 05, 2019.  She described a pattern of hypersexuality as well as some other manic symptoms that she and her therapist had discussed.  We decided to switch from Abilify to Depakote as a mood stabilizer.   11/16/2019 appointment with the following noted: Did switch sx.  Episodes mania are gone but more depression.  Couple weeks ago didn't want to leave the house.  Abilify did better with the depression. No manic sx in the last 3 weeks.  No further hypersexuality resolved.  Sleeping well.  Some racing thoughts but better.  Not irritable..  Some anxiety situational.   Concerta affordable. 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   Doing well without major issues.    Last visit changed to Metadate CD and that worked well but over $100.  Better energy and focus and more productive.  Tolerated well.  Ran out.  Dose seems good. Does ok without stimulant but better focus and energy and happier on it.  Be nice if insurance would cover it. Plan:Unfortunately she's been more depressed and has a tremor. Increase lamotrigine to 150 mg daily to help with depression BC tremor reduce Depakote to 750 mg HS.  Disc risk return of mania. Follow-up 8 weeks.   March 28, 2020 appointment with the following  noted Fine.  Hardly ever take Concerta bc it caused the tremor. Disc goal to get pregnant and need to get off Depakote. Rare panic.  Tolerates Ativan.  Ok off Concerta. Not depressed.  No manic behavior. Plan: Wean Depakote for pregnancy over 10 days.  Disc risk of this causing birth defects etc.  Disc risk mood swings without it.  Call. First step would be to increase lamotrigine.   TC 06/06/20 Pt with rapid mood swings off Depakote.  Might be pregnant.  Needs another stabilizer.  Took Latuda before but stopped DT cost.  Agrees to restart bc it's one of safest in pregnancy.  Will send in RX for 40 mg Latuda.   07/02/2020 appointment with the following noted: Not pregnant.  "I feel 10 times better" in a few days.  Happier and more productive.  No crying anymore. No significant anger like before.  Sleep is good. Last Friday was dizzy and told she could be having SE.  He told her to cut Latuda to 20 and then increase it back to 40 mg.  Back on 40 mg for 4-5 days.   Plan: Off Depakote and on Latuda & lamotrigine  bc pursuing pregnancy and cannot have stability without medication.  10/01/2020 appointment with the following noted: Mood pretty stable except occ moodiness with PMS.  No sig depression.  Anxiety manageable with 2-3 Ativan including when flying.  No major  problems.  No SE. Pursuing pregnancy.   Plan: no changes  02/19/21 appt noted: Recent anger outbursts a couple of times per week.  Including today at work which is unusual..  2 weeks depression and didn't want to get out of bed but also on menstrual cycle.  Lasted a day and resolved. Hungry lately.  No change in meds.  Energy seems down overall.  No particular trigger for these changes.  Says she can handle fatigue better than she can handle the mood swings. Plan: Because of recent mood swings increase Latuda from 40 mg to 60 mg daily.   07/20/2021 appointment with the following noted: Increase Latuda seemed to help with less emotional  outbursts.  Manageable now.  Ativan helps if too stressed out. No SE. Consistent with Latuda 60 and Lamotrigine 200 mg daily. Plan: No med changes Continue Latuda 60 and lamotrigine 200 mg daily.  02/11/22 appt noted: Pregnant [redacted] weeks.  [facility] 05/10/22. gIRL. Pregnancy going well.  WILL BREASTFEED 3 MOS. Mood pretty good.  Some outbursts of anger only around H and now separated and now is fine. Separated since early Feb. No anger otherwise. Separated in the past and had a few therapy sessions.  Difference of opinion and conflict over her 33 yo daughter. HX REALLY BAD POST-PARTUM DEPRESSION BUT NO HI OR SI AND HIT DAY AFTER SHE WAS BORN Patient reports stable mood and denies depressed or irritable moods.  Patient denies any recent difficulty with anxiety.  Patient denies difficulty with sleep initiation or maintenance. Denies appetite disturbance. .  Patient denies any difficulty with concentration.  Patient denies any suicidal ideation.  06/11/22 appt noted: Continues Latuda 60 and lamotrigine 200 mg daily. [name] born 1 month ago today.  Delivery unremarkable and was induced. Iris is healthy.  Issues with breast feeding not enough milk so mostly bottle feeding.  Nurses several times daily.   Growing and normal. Done surprisingly well without sig depression. No bonding problems. Separated and so night care by herself.   Tolerating meds well.   Asks about Concerta.    09/23/22 appt noted: Stopped Concerta bc interfered with napping and didn't help enough.  Doing ok without it. Mood is fine.  No sig PPD.  Some questions about having baby at her age but talks with therapist with it. Sleep is pretty good.  D sleep through the night. No mood swings, panic nor fear. No obsessive fear about baby.  Normal worry.   No SE issues.   No concerns with meds. Having PT for back and better.  12/08/22 TC with panic.  Needed prn lorazepam again.  03/16/23 appt noted: Continues Latuda 60 and lamotrigine  200 mg daily. March mother in hosp and needed lorazepam used rarely. Couple weeks ago really bad dep day triggered but usually handled that kind of issue well. No sig SI.  Dep lasted about 36 hours after bad for 24 hours.  Had to see therapist immediately and this was only suc episode to occur in recent past.   No avoidance.   D 10 mos old and doing well. Never breast fed since she was 9 weeks old.  No sign manic periods or anger , irritability or ohther mood swings. No SE Lost 30# intentionally.  Plan: continue Latuda 60 and lamotrigine 200 mg daily.  Lorazepam 1 m g prn. No med changes indicated.    2023-09-14 appt noted: M died Sep 24, 2023.  Should cry more than I have.  Not super depressed.  M sick since March bedridden.    Handling it well. Continues meds as above without SE Patient reports stable mood and denies depressed or irritable moods.  Patient denies any recent difficulty with anxiety except rarely.  Rare near panic.  Patient denies difficulty with sleep initiation or maintenance. Denies appetite disturbance.  Patient reports that energy and motivation have been good.  Patient denies any difficulty with concentration.  Patient denies any suicidal ideation. Seriously considering bariatric surgery , g sleeve.   Lost 35# on [employer] but couldn't lose more.  Struggling esp 10 years.    Couldn't qualify for GLP-1 meds bc insurance denial.    1 D 46yo.  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   Past Psychiatric Medication Trials: Citalopram, venlafaxine, Wellbutrin,  lamotrigine 200, Depakote fair response Latuda 60 good response, Vraylar side effects, Abilify 5,   Rexulti Ritalin, concerta    Review of Systems:  Review of Systems  Constitutional:  Positive for unexpected weight change. Negative for fatigue.  Cardiovascular:  Negative for chest pain and palpitations.  Neurological:  Negative for tremors and weakness.  Psychiatric/Behavioral:  Negative for dysphoric mood. The patient is not  nervous/anxious.     Medications: I have reviewed the patient's current medications.  Current Outpatient Medications  Medication Sig Dispense Refill   levonorgestrel (MIRENA) 20 MCG/DAY IUD 1 each by Intrauterine route once.     meloxicam (MOBIC) 7.5 MG tablet Take 1 tablet by mouth daily.     norethindrone (MICRONOR) 0.35 MG tablet Take 1 tablet (0.35 mg total) by mouth daily. 30 tablet 11   norethindrone (ORTHO MICRONOR) 0.35 MG tablet Take 1 tablet (0.35 mg total) by mouth daily. 28 tablet 11   lamoTRIgine (LAMICTAL) 100 MG tablet Take 2 tablets (200 mg total) by mouth daily. 180 tablet 1   levothyroxine (SYNTHROID) 50 MCG tablet Take by mouth.     LORazepam (ATIVAN) 1 MG tablet Take 1 tablet (1 mg total) by mouth every 8 (eight) hours as needed for anxiety. 30 tablet 2   Lurasidone HCl 120 MG TABS Take 1 tablet (120 mg total) by mouth daily at 12 noon. 90 tablet 0   No current facility-administered medications for this visit.    Medication Side Effects as noted  Allergies: No Known Allergies  Past Medical History:  Diagnosis Date   Abnormal Pap smear of cervix    Depression    HPV in female    Hypothyroidism     Family History  Problem Relation Age of Onset   COPD Mother    Thyroid disease Mother    Pulmonary fibrosis Mother    Hypertension Father    Diabetes Father    Heart disease Father    Cancer Paternal Uncle    Diabetes Paternal Grandfather    Heart disease Paternal Grandfather    Breast cancer Neg Hx    Cervical cancer Neg Hx    Colon cancer Neg Hx    Asthma Neg Hx    Kidney disease Neg Hx    Stroke Neg Hx     Social History   Socioeconomic History   Marital status: Married    Spouse name: Casimiro Needle   Number of children: 1   Years of education: Not on file   Highest education level: Not on file  Occupational History   Not on file  Tobacco Use   Smoking status: Former    Current packs/day: 0.00    Types: Cigarettes    Quit date: 01/03/2018  Years since quitting: 5.6   Smokeless tobacco: Never  Vaping Use   Vaping status: Former   Quit date: 04/08/2020   Substances: Nicotine  Substance and Sexual Activity   Alcohol use: Not Currently    Comment: not while preg   Drug use: No   Sexual activity: Yes    Birth control/protection: I.U.D.    Comment: Undecided  Other Topics Concern   Not on file  Social History Narrative   22 year old daughter    Social Determinants of Health   Financial Resource Strain: Low Risk  (07/25/2023)   Received from [facility]   Overall Financial Resource Strain (CARDIA)    Difficulty of Paying Living Expenses: Not hard at all  Food Insecurity: No Food Insecurity (07/25/2023)   Received from [facility]   Hunger Vital Sign    Worried About Running Out of Food in the Last Year: Never true    Ran Out of Food in the Last Year: Never true  Transportation Needs: No Transportation Needs (07/25/2023)   Received from [facility] - Transportation    In the past 12 months, has lack of transportation kept you from medical appointments or from getting medications?: No    Lack of Transportation (Non-Medical): No  Physical Activity: Not on file  Stress: Not on file  Social Connections: Not on file  Intimate Partner Violence: Not on file    Past Medical History, Surgical history, Social history, and Family history were reviewed and updated as appropriate.   Please see review of systems for further details on the patient's review from today.   Objective:   Physical Exam:  There were no vitals taken for this visit.  Physical Exam Neurological:     Mental Status: She is alert and oriented to person, place, and time.     Cranial Nerves: No dysarthria.  Psychiatric:        Attention and Perception: Attention and perception normal.        Mood and Affect: Mood normal. Mood is not anxious or depressed.        Speech: Speech normal.         Behavior: Behavior is cooperative.        Thought Content: Thought content normal. Thought content is not paranoid or delusional. Thought content does not include homicidal or suicidal ideation. Thought content does not include suicidal plan.        Cognition and Memory: Cognition and memory normal.        Judgment: Judgment normal.     Comments: Insight intact     Lab Review:     Component Value Date/Time   NA 139 12/26/2018 0145   NA 141 01/17/2017 1714   K 3.9 12/26/2018 0145   CL 106 12/26/2018 0145   CO2 20 (L) 12/26/2018 0145   GLUCOSE 141 (H) 12/26/2018 0145   BUN 11 12/26/2018 0145   BUN 10 01/17/2017 1714   CREATININE 0.71 12/26/2018 0145   CALCIUM 8.6 (L) 12/26/2018 0145   PROT 7.1 12/26/2018 0145   PROT 7.7 01/17/2017 1714   ALBUMIN 3.6 12/26/2018 0145   ALBUMIN 4.7 01/17/2017 1714   AST 19 12/26/2018 0145   ALT 20 12/26/2018 0145   ALKPHOS 65 12/26/2018 0145   BILITOT 0.3 12/26/2018 0145   BILITOT 0.3 01/17/2017 1714   GFRNONAA >60 12/26/2018 0145   GFRAA >60 12/26/2018 0145       Component Value  Date/Time   WBC 21.5 (H) 05/12/2022 0545   RBC 3.97 05/12/2022 0545   HGB 10.7 (L) 05/12/2022 0545   HGB 11.5 02/26/2022 1136   HCT 32.7 (L) 05/12/2022 0545   HCT 35.2 02/26/2022 1136   PLT 228 05/12/2022 0545   PLT 290 02/26/2022 1136   MCV 82.4 05/12/2022 0545   MCV 85 02/26/2022 1136   MCH 27.0 05/12/2022 0545   MCHC 32.7 05/12/2022 0545   RDW 14.6 05/12/2022 0545   RDW 14.3 02/26/2022 1136    No results found for: "POCLITH", "LITHIUM"   No results found for: "PHENYTOIN", "PHENOBARB", "VALPROATE", "CBMZ"   .res Assessment: Plan:    Lisa "Tacey Ruiz" was seen today for follow-up.  Diagnoses and all orders for this visit:  Bipolar I disorder (HCC) -     lamoTRIgine (LAMICTAL) 100 MG tablet; Take 2 tablets (200 mg total) by mouth daily. -     Lurasidone HCl 120 MG TABS; Take 1 tablet (120 mg total) by mouth daily at 12 noon.  Panic disorder with  agoraphobia -     LORazepam (ATIVAN) 1 MG tablet; Take 1 tablet (1 mg total) by mouth every 8 (eight) hours as needed for anxiety.  Attention deficit hyperactivity disorder (ADHD), combined type     Overall mood stability is very good.  Patient has a history of good stability in mood with a combination of Depakote and Latuda.  Off Depakote and on Latuda bc prior pregnancy and cannot have stability without medication BC irritabilty.  Mood lability good with increase Latuda 60 mg and overall "way more stable" than with Depakote.  Good resp with lamotrigine/Latuda combo.   Took meds with preg and D is doing well.    Disc pending g sleeve.     Because of prior mood swings increased Latuda from 40 mg to 60 mg daily and she's remained markedly better.  Discussed the usual dosage range.  She is taking a relatively low dose to be the primary mood stabilizer.  Discussed potential metabolic side effects associated with atypical antipsychotics, as well as potential risk for movement side effects. Advised pt to contact office if movement side effects occur.   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  Okay as needed lorazepam for rare panic attacks.  Needs Ativan for trips.    continue Latuda 60 and lamotrigine 200 mg daily. No med changes indicated.    FU 6 mos bc stable.    Meredith Staggers, MD, DFAPA Please see After Visit Summary for patient specific instructions.  No future appointments.     No orders of the defined types were placed in this encounter.   -------------------------------

## 2023-09-12 ENCOUNTER — Ambulatory Visit: Payer: BC Managed Care – PPO | Admitting: Psychiatry

## 2023-10-17 ENCOUNTER — Other Ambulatory Visit: Payer: Self-pay | Admitting: Obstetrics & Gynecology

## 2023-10-17 DIAGNOSIS — Z1231 Encounter for screening mammogram for malignant neoplasm of breast: Secondary | ICD-10-CM

## 2023-10-24 ENCOUNTER — Other Ambulatory Visit (HOSPITAL_COMMUNITY): Payer: Self-pay | Admitting: General Surgery

## 2023-10-31 ENCOUNTER — Other Ambulatory Visit: Payer: Self-pay

## 2023-10-31 ENCOUNTER — Ambulatory Visit
Admission: RE | Admit: 2023-10-31 | Discharge: 2023-10-31 | Disposition: A | Discharge status: Home / Self Care | Payer: BC Managed Care – PPO | Source: Ambulatory Visit | Attending: General Surgery

## 2023-10-31 ENCOUNTER — Ambulatory Visit
Admission: RE | Admit: 2023-10-31 | Discharge: 2023-10-31 | Disposition: A | Discharge status: Home / Self Care | Payer: BC Managed Care – PPO | Source: Ambulatory Visit | Attending: General Surgery | Admitting: General Surgery

## 2023-11-07 ENCOUNTER — Encounter: Payer: BC Managed Care – PPO | Attending: General Surgery | Admitting: Dietician

## 2023-11-07 ENCOUNTER — Encounter: Payer: Self-pay | Admitting: Dietician

## 2023-11-07 VITALS — Ht 64.5 in | Wt 252.0 lb

## 2023-11-07 DIAGNOSIS — E669 Obesity, unspecified: Secondary | ICD-10-CM | POA: Insufficient documentation

## 2023-11-07 NOTE — Progress Notes (Signed)
 Nutrition Assessment for Bariatric Surgery: Pre-Surgery Behavioral and Nutrition Intervention Program   Medical Nutrition Therapy  Appt Start Time: 1047    End Time: 1203  Patient was seen on 11/07/2023 for Pre-Operative Nutrition Assessment. Purpose of todays visit  enhance perioperative outcomes along with a healthy weight maintenance   Referral stated Supervised Weight Loss (SWL) visits needed: 0  Pt completed visits.   Pt has cleared nutrition requirements.   Planned surgery: Sleeve Gastrectomy Pt expectation of surgery: wants to get down to 150 , just wants to be healthy Wants to get back to running 5k  Less joint pan  More energy  Pt states she wants to be able to lift things easier.   NUTRITION ASSESSMENT   Anthropometrics  Start weight at [facility]: 252 lbs (date: 11/07/2023)  Height: 5'4.5 in BMI: 42.59 kg/m2     Clinical   Pharmacotherapy: History of weight loss medication used: phentermine    Medical hx: Obesity , High cholesterol Medications: multivitamin , B- complex   Labs: A1c - 6 , cholesterol - 222 , ldl -140  Notable signs/symptoms: none  Any previous deficiencies? 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    Eat breakfast , doesn't have a lunch break  Still eats lunch  Snacks on a typical work day  Supportive husband that cooks most meals   Feels very hungry when she gets home from work and eats a big meal for dinner  Motivated to eat more protein and track protein intake  Doesn't get protein during lunch   Current Physical Activity Recommendations state 150 minutes per week of moderate to vigorous movement including Cardio and 1-2 days of resistance activities as well as  flexibility/balance activities:  Pts current physical activity: 4x a week walking 30 min , with 80% recommendation reached   Sleep Hygiene: duration and quality: usually good waking up for the pass two weeks , wakes up at 6 , 7-8 hrs typically  Current Patient Perceived Stress Level as stated by pt on a scale of 1-10 4 on average        Stress Management Techniques: anxiety meds once a month (rarely) , walking   According to the Dietary Guidelines for Americans Recommendation: equivalent 1.5-2 cups fruits per day, equivalent 2-3 cups vegetables per day and at least half all grains whole  Fruit servings per day (on average): 2-3 servings, meeting 100% recommendation  Non-starchy vegetable servings per day (on average): 1-2 servings most days (salad usually), meeting 50-66% recommendation  Whole Grains per day (on average): not everyday 0-1 servings   Number of meals missed/skipped per week out of 21: 0   24-Hr Dietary Recall First Meal: Cheese omelet (2 eggs ) Snack:  Second Meal: Leftovers or [place] and cheese sandwich  Snack: apples and pb , triscuits , goldfish  Third Meal: roast duck or chicken and cheese quesadilla Snack: pb crackers  or half a banana  Beverages: bubbly , la croix , water  , sweet tea , 12oz coffee with almond milk and sweetened creamer   Alcoholic beverages per week:  1   Estimated Energy Needs Calories: 1500  NUTRITION DIAGNOSIS  Overweight/obesity ([place]-3.3) related to past poor dietary habits and physical inactivity as evidenced by patient w/ planned sleeve surgery following dietary guidelines  for continued weight loss.  NUTRITION INTERVENTION  Nutrition counseling (C-1) and education (E-2) to facilitate bariatric surgery goals.  Educated pt on micronutrient deficiencies post-surgery and behavioral/dietary strategies to start in order to mitigate that risk   Behavioral and Dietary Interventions Pre-Op Goals Reviewed with the Patient Nutrition: Healthy  Eating Behaviors Switch to non-caloric, non-carbonated and non-caffeinated beverages such as  water , unsweetened tea, Crystal Light and zero calorie beverages (aim for 64 oz. per day) Cut out grazing between meals or at night  Find a protein shake you like Eat every 3-5 hours        Eliminate distractions while eating (TV, computer, reading, driving, texting) Take 51-88 minutes to eat a meal  Decrease high sugar foods/decrease high fat/fried foods Eliminate alcoholic beverages Increase protein intake (eggs, fish, chicken, yogurt) before surgery Eat non starchy vegetables 2 times a day 7 days a week Eat complex carbohydrates such as whole grains and fruits   Behavioral Modification: Physical Activity Increase my usual daily activity (use stairs, park farther, etc.) Engage in _______________________  activity  _______ minutes ______ times per week  Other:    _________________________________________________________________     Problem Solving I will think about my usual eating patterns and how to tweak them How can my friends and family support me Barriers to starting my changes Learn and understand appetite verses hunger   Healthy Coping Allow for ___________ activities per week to help me manage stress Reframe negative thoughts I will keep a picture of someone or something that is my inspiration & look at it daily   Monitoring  Weigh myself once a week  Measure my progress by monitoring how my clothes fit Keep a food record of what I eat and drink for the next ________ (time period) Take pictures of what I eat and drink for the next ________ (time period) Use an app to count steps/day for the next_______ (time period) Measure my progress such as increased energy and more restful sleep Monitor your acid reflux and bowel habits, are they getting better?   *Goals that are bolded indicate the pt would like to start working towards these  Handouts Provided Include  Bariatric Surgery  handouts (Nutrition Visits, Pre Surgery Behavioral Change Goals, Protein Shakes Brands to Choose From, Vitamins & Mineral Supplementation)  Learning Style & Readiness for Change Teaching method utilized: Visual, Auditory, and hands on  Demonstrated degree of understanding via: Teach Back  Readiness Level: preparation  Barriers to learning/adherence to lifestyle change: busy at work  RD's Notes for Next Visit     MONITORING & EVALUATION Dietary intake, weekly physical activity, body weight, and preoperative behavioral change goals   Next Steps  Pt has completed visits. No further supervised visits required/recommended. Patient is to follow up at [facility] for pre-op class >2 weeks before scheduled surgery.

## 2023-11-21 ENCOUNTER — Ambulatory Visit
Admission: RE | Admit: 2023-11-21 | Discharge: 2023-11-21 | Disposition: A | Discharge status: Home / Self Care | Payer: BC Managed Care – PPO | Source: Ambulatory Visit | Attending: Obstetrics & Gynecology | Admitting: Obstetrics & Gynecology

## 2023-11-21 DIAGNOSIS — Z1231 Encounter for screening mammogram for malignant neoplasm of breast: Secondary | ICD-10-CM | POA: Diagnosis present

## 2023-11-28 ENCOUNTER — Ambulatory Visit (INDEPENDENT_AMBULATORY_CARE_PROVIDER_SITE_OTHER): Payer: BC Managed Care – PPO | Admitting: Licensed Clinical Social Worker

## 2023-11-28 DIAGNOSIS — F319 Bipolar disorder, unspecified: Secondary | ICD-10-CM
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
## 2023-12-05 ENCOUNTER — Other Ambulatory Visit: Payer: Self-pay | Admitting: Otolaryngology

## 2023-12-05 DIAGNOSIS — R590 Localized enlarged lymph nodes: Secondary | ICD-10-CM

## 2023-12-12 ENCOUNTER — Ambulatory Visit
Admission: RE | Admit: 2023-12-12 | Discharge: 2023-12-12 | Disposition: A | Discharge status: Home / Self Care | Source: Ambulatory Visit | Attending: Otolaryngology | Admitting: Otolaryngology

## 2023-12-12 DIAGNOSIS — R590 Localized enlarged lymph nodes: Secondary | ICD-10-CM

## 2024-01-02 ENCOUNTER — Encounter: Attending: Internal Medicine | Admitting: Skilled Nursing Facility1

## 2024-01-02 VITALS — Wt 261.0 lb

## 2024-01-02 DIAGNOSIS — E66812 Obesity, class 2: Secondary | ICD-10-CM | POA: Diagnosis present

## 2024-01-02 DIAGNOSIS — Z6836 Body mass index (BMI) 36.0-36.9, adult: Secondary | ICD-10-CM | POA: Diagnosis present

## 2024-01-02 DIAGNOSIS — E6609 Other obesity due to excess calories: Secondary | ICD-10-CM | POA: Insufficient documentation

## 2024-01-03 ENCOUNTER — Ambulatory Visit: Payer: Self-pay | Admitting: General Surgery

## 2024-01-03 DIAGNOSIS — E039 Hypothyroidism, unspecified: Secondary | ICD-10-CM

## 2024-01-03 NOTE — Progress Notes (Signed)
 Surgery orders requested via Epic inbox.

## 2024-01-03 NOTE — Progress Notes (Signed)
 Pre-Operative Nutrition Class:    Patient was seen on 01/02/2024 for Pre-Operative Bariatric Surgery Education at [facility].    Anthropometrics  Start weight at [facility]: 252 lbs (date: 11/07/2023)  Weight: 261  Surgery: sleeve 01/17/2024   Clinical   Pharmacotherapy: History of weight loss medication used: phentermine   Medical hx: Obesity , High cholesterol Medications: multivitamin , B- complex   Labs: A1c - 6 , cholesterol - 222 , ldl -140  Notable signs/symptoms: none  Any previous deficiencies? No  Samples given per MNT protocol. Patient educated on appropriate usage: ensure Protein  Shake Lot # [id number] Exp: 07/28/2024  The following the learning objectives were met by the patient during this course: Identify Pre-Op Dietary Goals and will begin 2 weeks pre-operatively Identify appropriate sources of fluids and proteins  State protein recommendations and appropriate sources pre and post-operatively Identify Post-Operative Dietary Goals and will follow for 2 weeks post-operatively Identify appropriate multivitamin and calcium sources Describe the need for physical activity post-operatively and will follow MD recommendations State when to call healthcare provider regarding medication questions or post-operative complications When having a diagnosis of diabetes understanding hypoglycemia symptoms and the inclusion of 1 complex carbohydrate per meal  Handouts given during class include: Pre-Op Bariatric Surgery Diet Handout Protein Shake Handout Post-Op Bariatric Surgery Nutrition Handout BELT Program Information Flyer Support Group Information Flyer [facility] Bariatric Supplements Price List  Follow-Up Plan: Patient will follow-up at [facility] 2 weeks post operatively for diet advancement per MD.

## 2024-01-08 NOTE — Patient Instructions (Signed)
 SURGICAL WAITING ROOM VISITATION Patients having surgery or a procedure may have no more than 2 support people in the waiting area - these visitors may rotate in the visitor waiting room.   If the patient needs to stay at the hospital during part of their recovery, the visitor guidelines for inpatient rooms apply.  PRE-OP VISITATION  Pre-op nurse will coordinate an appropriate time for 1 support person to accompany the patient in pre-op.  This support person may not rotate.  This visitor will be contacted when the time is appropriate for the visitor to come back in the pre-op area.  Please refer to the [facility] website for the visitor guidelines for Inpatients (after your surgery is over and you are in a regular room).  You are not required to quarantine at this time prior to your surgery. However, you must do this: Hand Hygiene often Do NOT share personal items Notify your provider if you are in close contact with someone who has COVID or you develop fever 100.4 or greater, new onset of sneezing, cough, sore throat, shortness of breath or body aches.  If you test positive for Covid or have been in contact with anyone that has tested positive in the last 10 days please notify you surgeon.    Your procedure is scheduled on:  TUESDAY  January 17, 2024  Report to [facility] Main Entrance: [address] entrance where the [employer] is available.   Report to admitting at: 08:15 AM  Call this number if you have any questions or problems the morning of surgery [phone]  FOLLOW ANY ADDITIONAL PRE OP INSTRUCTIONS YOU RECEIVED FROM YOUR SURGEON'S OFFICE!!!  Do not eat food after Midnight the night prior to your surgery/procedure.  After Midnight you may have the following liquids until  07:30 AM DAY OF SURGERY  Clear Liquid Diet Water Black Coffee (sugar ok, NO MILK/CREAM OR CREAMERS)  Tea (sugar ok, NO MILK/CREAM OR CREAMERS) regular and decaf                             Plain  Jell-O  with no fruit (NO RED)                                           Fruit ices (not with fruit pulp, NO RED)                                     Popsicles (NO RED)                                                                  Juice: NO CITRUS JUICES: only apple, WHITE grape, WHITE cranberry Sports drinks like Gatorade or Powerade (NO RED)                   The day of surgery:  Drink ONE (1) Pre-Surgery G2 at  07:30 AM the morning of surgery. Drink in one sitting. Do not sip.  This drink was given to you during your hospital  pre-op appointment visit. Nothing else to drink after completing the Pre-Surgery G2 : No candy, chewing gum or throat lozenges.     Oral Hygiene is also important to reduce your risk of infection.        Remember - BRUSH YOUR TEETH THE MORNING OF SURGERY WITH YOUR REGULAR TOOTHPASTE  Do NOT smoke after Midnight the night before surgery.  STOP TAKING all Vitamins, Herbs and supplements 1 week before your surgery.   Take ONLY these medicines the morning of surgery with A SIP OF WATER: levothyroxine, lamotrigine (Lamictal), and Lorazepam (Ativan) if needed.                    You may not have any metal on your body including hair pins, jewelry, and body piercing  Do not wear make-up, lotions, powders, perfumes  or deodorant  Do not wear nail polish including gel and S&S, artificial / acrylic nails, or any other type of covering on natural nails including finger and toenails. If you have artificial nails, gel coating, etc., that needs to be removed by a nail salon, Please have this removed prior to surgery. Not doing so may mean that your surgery could be cancelled or delayed if the Surgeon or anesthesia staff feels like they are unable to monitor you safely.   Do not shave 48 hours prior to surgery to avoid nicks in your skin which may contribute to postoperative infections.   Contacts, Hearing Aids, dentures or bridgework may not be worn into surgery.  DENTURES WILL BE REMOVED PRIOR TO SURGERY PLEASE DO NOT APPLY "Poly grip" OR ADHESIVES!!!  You may bring a small overnight bag with you on the day of surgery, only pack items that are not valuable. [facility] IS NOT RESPONSIBLE   FOR VALUABLES THAT ARE LOST OR STOLEN.   Do not bring your home medications to the hospital. The Pharmacy will dispense medications listed on your medication list to you during your admission in the Hospital.   Please read over the following fact sheets you were given: IF YOU HAVE QUESTIONS ABOUT YOUR PRE-OP INSTRUCTIONS, PLEASE CALL [phone]   [facility] - Preparing for Surgery Before surgery, you can play an important role.  Because skin is not sterile, your skin needs to be as free of germs as possible.  You can reduce the number of germs on your skin by washing with CHG (chlorahexidine gluconate) soap before surgery.  CHG is an antiseptic cleaner which kills germs and bonds with the skin to continue killing germs even after washing. Please DO NOT use if you have an allergy to CHG or antibacterial soaps.  If your skin becomes reddened/irritated stop using the CHG and inform your nurse when you arrive at Short Stay. Do not shave (including legs and underarms) for at least 48 hours prior to the first CHG shower.  You may shave your face/neck.  Please follow these instructions carefully:  1.  Shower with CHG Soap the night before surgery and the  morning of surgery.  2.  If you choose to wash your hair, wash your hair first as usual with your normal  shampoo.  3.  After you shampoo, rinse your hair and body thoroughly to remove the shampoo.                             4.  Use CHG as you would any other liquid soap.  You can  apply chg directly to the skin and wash.  Gently with a scrungie or clean washcloth.  5.  Apply the CHG Soap to your body ONLY FROM THE NECK DOWN.   Do not use on face/ open                           Wound or open sores. Avoid contact with  eyes, ears mouth and genitals (private parts).                       Wash face,  Genitals (private parts) with your normal soap.             6.  Wash thoroughly, paying special attention to the area where your  surgery  will be performed.  7.  Thoroughly rinse your body with warm water from the neck down.  8.  DO NOT shower/wash with your normal soap after using and rinsing off the CHG Soap.            9.  Pat yourself dry with a clean towel.            10.  Wear clean pajamas.            11.  Place clean sheets on your bed the night of your first shower and do not  sleep with pets.  ON THE DAY OF SURGERY : Do not apply any lotions/deodorants the morning of surgery.  Please wear clean clothes to the hospital/surgery center.     FAILURE TO FOLLOW THESE INSTRUCTIONS MAY RESULT IN THE CANCELLATION OF YOUR SURGERY  PATIENT SIGNATURE_________________________________  NURSE SIGNATURE__________________________________  ________________________________________________________________________        Lisa Schroeder    An incentive spirometer is a tool that can help keep your lungs clear and active. This tool measures how well you are filling your lungs with each breath. Taking long deep breaths may help reverse or decrease the chance of developing breathing (pulmonary) problems (especially infection) following: A long period of time when you are unable to move or be active. BEFORE THE PROCEDURE  If the spirometer includes an indicator to show your best effort, your nurse or respiratory therapist will set it to a desired goal. If possible, sit up straight or lean slightly forward. Try not to slouch. Hold the incentive spirometer in an upright position. INSTRUCTIONS FOR USE  Sit on the edge of your bed if possible, or sit up as far as you can in bed or on a chair. Hold the incentive spirometer in an upright position. Breathe out normally. Place the mouthpiece in your mouth and  seal your lips tightly around it. Breathe in slowly and as deeply as possible, raising the piston or the ball toward the top of the column. Hold your breath for 3-5 seconds or for as long as possible. Allow the piston or ball to fall to the bottom of the column. Remove the mouthpiece from your mouth and breathe out normally. Rest for a few seconds and repeat Steps 1 through 7 at least 10 times every 1-2 hours when you are awake. Take your time and take a few normal breaths between deep breaths. The spirometer may include an indicator to show your best effort. Use the indicator as a goal to work toward during each repetition. After each set of 10 deep breaths, practice coughing to be sure your lungs are clear. If you have an incision (the cut  made at the time of surgery), support your incision when coughing by placing a pillow or rolled up towels firmly against it. Once you are able to get out of bed, walk around indoors and cough well. You may stop using the incentive spirometer when instructed by your caregiver.  RISKS AND COMPLICATIONS Take your time so you do not get dizzy or light-headed. If you are in pain, you may need to take or ask for pain medication before doing incentive spirometry. It is harder to take a deep breath if you are having pain. AFTER USE Rest and breathe slowly and easily. It can be helpful to keep track of a log of your progress. Your caregiver can provide you with a simple table to help with this. If you are using the spirometer at home, follow these instructions: SEEK MEDICAL CARE IF:  You are having difficultly using the spirometer. You have trouble using the spirometer as often as instructed. Your pain medication is not giving enough relief while using the spirometer. You develop fever of 100.5 F (38.1 C) or higher.                                                                                                    SEEK IMMEDIATE MEDICAL CARE IF:  You cough up bloody  sputum that had not been present before. You develop fever of 102 F (38.9 C) or greater. You develop worsening pain at or near the incision site. MAKE SURE YOU:  Understand these instructions. Will watch your condition. Will get help right away if you are not doing well or get worse. Document Released: 01/24/2007 Document Revised: 12/06/2011 Document Reviewed: 03/27/2007 [facility] Patient Information 2014 [place], [place].

## 2024-01-08 NOTE — Progress Notes (Signed)
 COVID Vaccine received:  []  No [x]  Yes Date of any COVID positive Test in last 90 days:  PCP - Antonio Baumgarten, MD at [facility] [phone] (Work)   [phone] (Fax)  Cardiologist -  none  Chest x-ray - 10-31-2023  2v  Epic EKG -  10-31-2023  Epic Stress Test -  ECHO -  Cardiac Cath -   Bowel Prep - []  No  []   Yes _ per Bariatric orders  Pacemaker / ICD device [x]  No []  Yes   Spinal Cord Stimulator:[x]  No []  Yes       History of Sleep Apnea? [x]  No []  Yes   CPAP used?- [x]  No []  Yes    Does the patient monitor blood sugar?   []  N/A   []  No []  Yes  Patient has: []  NO Hx DM   [x]  Pre-DM   []  DM1  []   DM2 Last A1c was: 6.0  on  07-18-2023       Blood Thinner / Instructions:none Aspirin Instructions:  none  ERAS Protocol Ordered: []  No  [x]  Yes PRE-SURGERY []  ENSURE  [x]  G2   Patient is to be NPO after: 0730  Dental hx: []  Dentures:  []  N/A      []  Bridge or Partial:                   []  Loose or Damaged teeth:   Comments: Patient has never had any surgeries, very anxious.   Activity level: Patient is able / unable to climb a flight of stairs without difficulty; []  No CP  []  No SOB, but would have ___   Patient can / can not perform ADLs without assistance.   Anesthesia review: Bipolar I, Pre-DM, ADHD, panic attacks, Hx Phentermine use,   Patient denies shortness of breath, fever, cough and chest pain at PAT appointment.  Patient verbalized understanding and agreement to the Pre-Surgical Instructions that were given to them at this PAT appointment. Patient was also educated of the need to review these PAT instructions again prior to her surgery.I reviewed the appropriate phone numbers to call if they have any and questions or concerns.

## 2024-01-09 ENCOUNTER — Encounter (HOSPITAL_COMMUNITY): Payer: Self-pay

## 2024-01-09 ENCOUNTER — Other Ambulatory Visit: Payer: Self-pay

## 2024-01-09 ENCOUNTER — Telehealth: Payer: Self-pay | Admitting: Psychiatry

## 2024-01-09 ENCOUNTER — Encounter (HOSPITAL_COMMUNITY)
Admission: RE | Admit: 2024-01-09 | Discharge: 2024-01-09 | Disposition: A | Discharge status: Home / Self Care | Source: Ambulatory Visit | Attending: General Surgery | Admitting: General Surgery

## 2024-01-09 VITALS — BP 135/92 | HR 90 | Temp 98.6°F | Resp 20 | Ht 64.5 in | Wt 252.0 lb

## 2024-01-09 DIAGNOSIS — R7303 Prediabetes: Secondary | ICD-10-CM | POA: Diagnosis not present

## 2024-01-09 DIAGNOSIS — F909 Attention-deficit hyperactivity disorder, unspecified type: Secondary | ICD-10-CM | POA: Insufficient documentation

## 2024-01-09 DIAGNOSIS — Z79899 Other long term (current) drug therapy: Secondary | ICD-10-CM | POA: Diagnosis not present

## 2024-01-09 DIAGNOSIS — E66812 Obesity, class 2: Secondary | ICD-10-CM

## 2024-01-09 DIAGNOSIS — T505X5S Adverse effect of appetite depressants, sequela: Secondary | ICD-10-CM

## 2024-01-09 DIAGNOSIS — Z01818 Encounter for other preprocedural examination: Secondary | ICD-10-CM | POA: Diagnosis present

## 2024-01-09 DIAGNOSIS — E039 Hypothyroidism, unspecified: Secondary | ICD-10-CM

## 2024-01-09 DIAGNOSIS — Z6836 Body mass index (BMI) 36.0-36.9, adult: Secondary | ICD-10-CM | POA: Insufficient documentation

## 2024-01-09 HISTORY — DX: Unspecified asthma, uncomplicated: J45.909

## 2024-01-09 HISTORY — DX: Attention-deficit hyperactivity disorder, unspecified type: F90.9

## 2024-01-09 HISTORY — DX: Prediabetes: R73.03

## 2024-01-09 HISTORY — DX: Unspecified osteoarthritis, unspecified site: M19.90

## 2024-01-09 HISTORY — DX: Bipolar disorder, unspecified: F31.9

## 2024-01-09 LAB — COMPREHENSIVE METABOLIC PANEL WITH GFR
ALT: 34 U/L (ref 0–44)
AST: 24 U/L (ref 15–41)
Albumin: 4.2 g/dL (ref 3.5–5.0)
Alkaline Phosphatase: 66 U/L (ref 38–126)
Anion gap: 10 (ref 5–15)
BUN: 15 mg/dL (ref 6–20)
CO2: 24 mmol/L (ref 22–32)
Calcium: 9.2 mg/dL (ref 8.9–10.3)
Chloride: 101 mmol/L (ref 98–111)
Creatinine, Ser: 0.96 mg/dL (ref 0.44–1.00)
GFR, Estimated: >60 mL/min (ref >60)
Glucose, Bld: 95 mg/dL (ref 70–99)
Potassium: 4.2 mmol/L (ref 3.5–5.1)
Sodium: 135 mmol/L (ref 135–145)
Total Bilirubin: 1 mg/dL (ref 0.0–1.2)
Total Protein: 8.3 g/dL — ABNORMAL HIGH (ref 6.5–8.1)

## 2024-01-09 LAB — CBC WITH DIFFERENTIAL/PLATELET
Abs Immature Granulocytes: 0.12 10*3/uL — ABNORMAL HIGH (ref 0.00–0.07)
Basophils Absolute: 0.1 10*3/uL (ref 0.0–0.1)
Basophils Relative: 1 %
Eosinophils Absolute: 0.1 10*3/uL (ref 0.0–0.5)
Eosinophils Relative: 1 %
HCT: 42.7 % (ref 36.0–46.0)
Hemoglobin: 14.1 g/dL (ref 12.0–15.0)
Immature Granulocytes: 1 %
Lymphocytes Relative: 26 %
Lymphs Abs: 2.6 10*3/uL (ref 0.7–4.0)
MCH: 28.4 pg (ref 26.0–34.0)
MCHC: 33 g/dL (ref 30.0–36.0)
MCV: 86.1 fL (ref 80.0–100.0)
Monocytes Absolute: 0.7 10*3/uL (ref 0.1–1.0)
Monocytes Relative: 7 %
Neutro Abs: 6.2 10*3/uL (ref 1.7–7.7)
Neutrophils Relative %: 64 %
Platelets: 348 10*3/uL (ref 150–400)
RBC: 4.96 MIL/uL (ref 3.87–5.11)
RDW: 13.7 % (ref 11.5–15.5)
WBC: 9.8 10*3/uL (ref 4.0–10.5)
nRBC: 0 % (ref 0.0–0.2)

## 2024-01-09 NOTE — Telephone Encounter (Signed)
 Noted.  Her meds should be fine with this proccedure.

## 2024-01-09 NOTE — Telephone Encounter (Signed)
 Lisa Schroeder called at 11:39 because she is having Gastrulation surgery 01/17/24.  They wanted her to advise you that she was having this surgery incase you had questions or concerns.  They do not need a letter or anything from you approving the surgery, just wanted you to know.

## 2024-01-09 NOTE — Telephone Encounter (Signed)
 Please see message. It looks like she is having a gastric sleeve surgery.

## 2024-01-13 NOTE — Discharge Instructions (Signed)

## 2024-01-17 ENCOUNTER — Encounter (HOSPITAL_COMMUNITY): Payer: Self-pay | Admitting: General Surgery

## 2024-01-17 ENCOUNTER — Ambulatory Visit (HOSPITAL_COMMUNITY): Admitting: Anesthesiology

## 2024-01-17 ENCOUNTER — Other Ambulatory Visit: Payer: Self-pay

## 2024-01-17 ENCOUNTER — Encounter (HOSPITAL_COMMUNITY)
Admission: RE | Disposition: A | Discharge status: Home / Self Care | Payer: Self-pay | Source: Ambulatory Visit | Attending: General Surgery

## 2024-01-17 ENCOUNTER — Ambulatory Visit (HOSPITAL_COMMUNITY)
Admission: RE | Admit: 2024-01-17 | Discharge: 2024-01-18 | Disposition: A | Discharge status: Home / Self Care | Source: Ambulatory Visit | Attending: General Surgery | Admitting: General Surgery

## 2024-01-17 DIAGNOSIS — Z9884 Bariatric surgery status: Secondary | ICD-10-CM

## 2024-01-17 DIAGNOSIS — F3170 Bipolar disorder, currently in remission, most recent episode unspecified: Secondary | ICD-10-CM | POA: Diagnosis not present

## 2024-01-17 DIAGNOSIS — E66813 Obesity, class 3: Secondary | ICD-10-CM | POA: Diagnosis not present

## 2024-01-17 DIAGNOSIS — R7303 Prediabetes: Secondary | ICD-10-CM | POA: Diagnosis not present

## 2024-01-17 DIAGNOSIS — E039 Hypothyroidism, unspecified: Secondary | ICD-10-CM | POA: Diagnosis not present

## 2024-01-17 DIAGNOSIS — Z87891 Personal history of nicotine dependence: Secondary | ICD-10-CM | POA: Diagnosis not present

## 2024-01-17 DIAGNOSIS — Z6841 Body Mass Index (BMI) 40.0 and over, adult: Secondary | ICD-10-CM | POA: Diagnosis not present

## 2024-01-17 DIAGNOSIS — F909 Attention-deficit hyperactivity disorder, unspecified type: Secondary | ICD-10-CM

## 2024-01-17 DIAGNOSIS — M1990 Unspecified osteoarthritis, unspecified site: Secondary | ICD-10-CM | POA: Diagnosis not present

## 2024-01-17 DIAGNOSIS — Z01818 Encounter for other preprocedural examination: Secondary | ICD-10-CM

## 2024-01-17 DIAGNOSIS — E7800 Pure hypercholesterolemia, unspecified: Secondary | ICD-10-CM | POA: Diagnosis not present

## 2024-01-17 DIAGNOSIS — E559 Vitamin D deficiency, unspecified: Secondary | ICD-10-CM | POA: Insufficient documentation

## 2024-01-17 HISTORY — PX: UPPER GI ENDOSCOPY: SHX6162

## 2024-01-17 HISTORY — PX: LAPAROSCOPIC GASTRIC SLEEVE RESECTION: SHX5895

## 2024-01-17 LAB — TYPE AND SCREEN
ABO/RH(D): A POS
Antibody Screen: NEGATIVE

## 2024-01-17 LAB — HEMOGLOBIN AND HEMATOCRIT, BLOOD
HCT: 43.5 % (ref 36.0–46.0)
Hemoglobin: 13.7 g/dL (ref 12.0–15.0)

## 2024-01-17 LAB — POCT PREGNANCY, URINE: Preg Test, Ur: NEGATIVE

## 2024-01-17 SURGERY — GASTRECTOMY, SLEEVE, LAPAROSCOPIC
Anesthesia: General

## 2024-01-17 MED ORDER — SODIUM CHLORIDE 0.9 % IV SOLN: 12.5000 mg | Freq: Four times a day (QID) | INTRAVENOUS | Status: DC | PRN

## 2024-01-17 MED ORDER — LAMOTRIGINE 100 MG PO TABS
200.0000 mg | ORAL_TABLET | Freq: Every day | ORAL | Status: DC
  Administered 2024-01-18: 200 mg via ORAL
  Filled 2024-01-17: qty 2

## 2024-01-17 MED ORDER — MORPHINE SULFATE (PF) 2 MG/ML IV SOLN: 1.0000 mg | INTRAVENOUS | Status: DC | PRN

## 2024-01-17 MED ORDER — DEXAMETHASONE SODIUM PHOSPHATE 10 MG/ML IJ SOLN
4.0000 mg | INTRAMUSCULAR | Status: AC
  Administered 2024-01-17: 10 mg via INTRAVENOUS

## 2024-01-17 MED ORDER — ONDANSETRON HCL 4 MG/2ML IJ SOLN
INTRAMUSCULAR | Status: AC
  Filled 2024-01-17: qty 2

## 2024-01-17 MED ORDER — FENTANYL CITRATE PF 50 MCG/ML IJ SOSY
PREFILLED_SYRINGE | INTRAMUSCULAR | Status: AC
  Filled 2024-01-17: qty 2

## 2024-01-17 MED ORDER — PHENYLEPHRINE HCL (PRESSORS) 10 MG/ML IV SOLN
INTRAVENOUS | Status: DC | PRN
Start: 2024-01-17 — End: 2024-01-17
  Administered 2024-01-17 (×2): 80 ug via INTRAVENOUS
  Administered 2024-01-17: 160 ug via INTRAVENOUS

## 2024-01-17 MED ORDER — ACETAMINOPHEN 10 MG/ML IV SOLN: 1000.0000 mg | Freq: Once | INTRAVENOUS | Status: DC | PRN

## 2024-01-17 MED ORDER — PROPOFOL 10 MG/ML IV BOLUS
INTRAVENOUS | Status: DC | PRN
  Administered 2024-01-17: 50 ug/kg/min via INTRAVENOUS
  Administered 2024-01-17: 200 mg via INTRAVENOUS

## 2024-01-17 MED ORDER — KCL IN DEXTROSE-NACL 20-5-0.45 MEQ/L-%-% IV SOLN
INTRAVENOUS | Status: DC
  Filled 2024-01-17 (×3): qty 1000

## 2024-01-17 MED ORDER — OXYCODONE HCL 5 MG/5ML PO SOLN: 5.0000 mg | Freq: Once | ORAL | Status: DC | PRN

## 2024-01-17 MED ORDER — MIDAZOLAM HCL 2 MG/2ML IJ SOLN
INTRAMUSCULAR | Status: AC
  Filled 2024-01-17: qty 2

## 2024-01-17 MED ORDER — FENTANYL CITRATE PF 50 MCG/ML IJ SOSY
25.0000 ug | PREFILLED_SYRINGE | INTRAMUSCULAR | Status: DC | PRN
  Administered 2024-01-17 (×2): 50 ug via INTRAVENOUS

## 2024-01-17 MED ORDER — MIDAZOLAM HCL 5 MG/5ML IJ SOLN
INTRAMUSCULAR | Status: DC | PRN
  Administered 2024-01-17: 2 mg via INTRAVENOUS

## 2024-01-17 MED ORDER — SUGAMMADEX SODIUM 200 MG/2ML IV SOLN
INTRAVENOUS | Status: DC | PRN
  Administered 2024-01-17: 300 mg via INTRAVENOUS

## 2024-01-17 MED ORDER — ONDANSETRON HCL 4 MG/2ML IJ SOLN: 4.0000 mg | Freq: Once | INTRAMUSCULAR | Status: DC | PRN

## 2024-01-17 MED ORDER — OXYCODONE HCL 5 MG PO TABS: 5.0000 mg | ORAL_TABLET | Freq: Once | ORAL | Status: DC | PRN

## 2024-01-17 MED ORDER — 0.9 % SODIUM CHLORIDE (POUR BTL) OPTIME
TOPICAL | Status: DC | PRN
  Administered 2024-01-17: 1000 mL

## 2024-01-17 MED ORDER — SCOPOLAMINE 1 MG/3DAYS TD PT72
1.0000 | MEDICATED_PATCH | TRANSDERMAL | Status: DC
  Administered 2024-01-17: 1.5 mg via TRANSDERMAL
  Filled 2024-01-17: qty 1

## 2024-01-17 MED ORDER — CHLORHEXIDINE GLUCONATE 4 % EX SOLN: Freq: Once | CUTANEOUS | Status: DC

## 2024-01-17 MED ORDER — FENTANYL CITRATE (PF) 100 MCG/2ML IJ SOLN
INTRAMUSCULAR | Status: AC
  Filled 2024-01-17: qty 2

## 2024-01-17 MED ORDER — ORAL CARE MOUTH RINSE: 15.0000 mL | Freq: Once | OROMUCOSAL | Status: AC

## 2024-01-17 MED ORDER — LACTATED RINGERS IV SOLN: INTRAVENOUS | Status: DC

## 2024-01-17 MED ORDER — DEXMEDETOMIDINE HCL IN NACL 80 MCG/20ML IV SOLN
INTRAVENOUS | Status: DC | PRN
  Administered 2024-01-17 (×2): 6 ug via INTRAVENOUS

## 2024-01-17 MED ORDER — GABAPENTIN 100 MG PO CAPS
100.0000 mg | ORAL_CAPSULE | Freq: Two times a day (BID) | ORAL | Status: DC
  Administered 2024-01-17 – 2024-01-18 (×2): 100 mg via ORAL
  Filled 2024-01-17 (×2): qty 1

## 2024-01-17 MED ORDER — APREPITANT 40 MG PO CAPS
40.0000 mg | ORAL_CAPSULE | ORAL | Status: AC
  Administered 2024-01-17: 40 mg via ORAL
  Filled 2024-01-17: qty 1

## 2024-01-17 MED ORDER — ACETAMINOPHEN 160 MG/5ML PO SOLN
1000.0000 mg | Freq: Three times a day (TID) | ORAL | Status: DC
  Administered 2024-01-17: 1000 mg via ORAL
  Filled 2024-01-17: qty 40.6

## 2024-01-17 MED ORDER — LEVOTHYROXINE SODIUM 50 MCG PO TABS
50.0000 ug | ORAL_TABLET | Freq: Every day | ORAL | Status: DC
  Administered 2024-01-18: 50 ug via ORAL
  Filled 2024-01-17: qty 1

## 2024-01-17 MED ORDER — HEPARIN SODIUM (PORCINE) 5000 UNIT/ML IJ SOLN
5000.0000 [IU] | INTRAMUSCULAR | Status: AC
Start: 2024-01-17 — End: 2024-01-17
  Administered 2024-01-17: 5000 [IU] via SUBCUTANEOUS
  Filled 2024-01-17: qty 1

## 2024-01-17 MED ORDER — ENSURE MAX PROTEIN PO LIQD
2.0000 [oz_av] | ORAL | Status: DC
  Administered 2024-01-18 (×3): 2 [oz_av] via ORAL

## 2024-01-17 MED ORDER — HYDROMORPHONE HCL 1 MG/ML IJ SOLN
0.2500 mg | INTRAMUSCULAR | Status: DC | PRN
  Administered 2024-01-17 (×2): 0.5 mg via INTRAVENOUS

## 2024-01-17 MED ORDER — BUPIVACAINE HCL (PF) 0.25 % IJ SOLN
INTRAMUSCULAR | Status: DC | PRN
  Administered 2024-01-17: 60 mL

## 2024-01-17 MED ORDER — ACETAMINOPHEN 500 MG PO TABS
1000.0000 mg | ORAL_TABLET | ORAL | Status: AC
  Administered 2024-01-17: 1000 mg via ORAL
  Filled 2024-01-17: qty 2

## 2024-01-17 MED ORDER — ONDANSETRON HCL 4 MG/2ML IJ SOLN: 4.0000 mg | Freq: Four times a day (QID) | INTRAMUSCULAR | Status: DC | PRN

## 2024-01-17 MED ORDER — SIMETHICONE 80 MG PO CHEW
80.0000 mg | CHEWABLE_TABLET | Freq: Four times a day (QID) | ORAL | Status: DC | PRN
  Administered 2024-01-17 – 2024-01-18 (×2): 80 mg via ORAL
  Filled 2024-01-17 (×3): qty 1

## 2024-01-17 MED ORDER — SODIUM CHLORIDE 0.9 % IV SOLN
2.0000 g | INTRAVENOUS | Status: AC
  Administered 2024-01-17: 2 g via INTRAVENOUS
  Filled 2024-01-17: qty 2

## 2024-01-17 MED ORDER — ROCURONIUM BROMIDE 100 MG/10ML IV SOLN
INTRAVENOUS | Status: DC | PRN
  Administered 2024-01-17: 80 mg via INTRAVENOUS

## 2024-01-17 MED ORDER — ONDANSETRON HCL 4 MG/2ML IJ SOLN
INTRAMUSCULAR | Status: DC | PRN
  Administered 2024-01-17: 4 mg via INTRAVENOUS

## 2024-01-17 MED ORDER — LIDOCAINE HCL (CARDIAC) PF 100 MG/5ML IV SOSY
PREFILLED_SYRINGE | INTRAVENOUS | Status: DC | PRN
  Administered 2024-01-17: 100 mg via INTRAVENOUS

## 2024-01-17 MED ORDER — CHLORHEXIDINE GLUCONATE 0.12 % MT SOLN
15.0000 mL | Freq: Once | OROMUCOSAL | Status: AC
  Administered 2024-01-17: 15 mL via OROMUCOSAL

## 2024-01-17 MED ORDER — BUPIVACAINE-EPINEPHRINE (PF) 0.25% -1:200000 IJ SOLN
INTRAMUSCULAR | Status: AC
  Filled 2024-01-17: qty 60

## 2024-01-17 MED ORDER — LURASIDONE HCL 40 MG PO TABS
60.0000 mg | ORAL_TABLET | Freq: Every day | ORAL | Status: DC
  Administered 2024-01-17: 60 mg via ORAL
  Filled 2024-01-17 (×2): qty 2

## 2024-01-17 MED ORDER — CHLORHEXIDINE GLUCONATE 4 % EX SOLN
Freq: Once | CUTANEOUS | Status: DC
Start: 2024-01-18 — End: 2024-01-17

## 2024-01-17 MED ORDER — PANTOPRAZOLE SODIUM 40 MG IV SOLR
40.0000 mg | Freq: Every day | INTRAVENOUS | Status: DC
  Administered 2024-01-17: 40 mg via INTRAVENOUS
  Filled 2024-01-17: qty 10

## 2024-01-17 MED ORDER — ACETAMINOPHEN 500 MG PO TABS
1000.0000 mg | ORAL_TABLET | Freq: Three times a day (TID) | ORAL | Status: DC
  Administered 2024-01-17 – 2024-01-18 (×2): 1000 mg via ORAL
  Filled 2024-01-17 (×2): qty 2

## 2024-01-17 MED ORDER — LORAZEPAM 1 MG PO TABS: 1.0000 mg | ORAL_TABLET | Freq: Three times a day (TID) | ORAL | Status: DC | PRN

## 2024-01-17 MED ORDER — FENTANYL CITRATE (PF) 100 MCG/2ML IJ SOLN
INTRAMUSCULAR | Status: DC | PRN
  Administered 2024-01-17: 50 ug via INTRAVENOUS
  Administered 2024-01-17: 100 ug via INTRAVENOUS

## 2024-01-17 MED ORDER — LACTATED RINGERS IR SOLN
Status: DC | PRN
  Administered 2024-01-17: 1000 mL

## 2024-01-17 MED ORDER — HEPARIN SODIUM (PORCINE) 5000 UNIT/ML IJ SOLN
5000.0000 [IU] | Freq: Three times a day (TID) | INTRAMUSCULAR | Status: DC
  Administered 2024-01-17 – 2024-01-18 (×2): 5000 [IU] via SUBCUTANEOUS
  Filled 2024-01-17 (×2): qty 1

## 2024-01-17 MED ORDER — HYDROMORPHONE HCL 1 MG/ML IJ SOLN
INTRAMUSCULAR | Status: AC
  Filled 2024-01-17: qty 1

## 2024-01-17 MED ORDER — ROCURONIUM BROMIDE 10 MG/ML (PF) SYRINGE
PREFILLED_SYRINGE | INTRAVENOUS | Status: AC
  Filled 2024-01-17: qty 10

## 2024-01-17 MED ORDER — STERILE WATER FOR IRRIGATION IR SOLN
Status: DC | PRN
Start: 2024-01-17 — End: 2024-01-17
  Administered 2024-01-17: 2000 mL

## 2024-01-17 MED ORDER — GABAPENTIN 100 MG PO CAPS
100.0000 mg | ORAL_CAPSULE | ORAL | Status: AC
Start: 2024-01-17 — End: 2024-01-17
  Administered 2024-01-17: 100 mg via ORAL
  Filled 2024-01-17: qty 1

## 2024-01-17 MED ORDER — DEXAMETHASONE SODIUM PHOSPHATE 10 MG/ML IJ SOLN
INTRAMUSCULAR | Status: AC
  Filled 2024-01-17: qty 1

## 2024-01-17 MED ORDER — PHENYLEPHRINE HCL-NACL 20-0.9 MG/250ML-% IV SOLN
INTRAVENOUS | Status: DC | PRN
  Administered 2024-01-17: 25 ug/min via INTRAVENOUS

## 2024-01-17 MED ORDER — OXYCODONE HCL 5 MG/5ML PO SOLN
5.0000 mg | Freq: Four times a day (QID) | ORAL | Status: DC | PRN
  Administered 2024-01-17 – 2024-01-18 (×3): 5 mg via ORAL
  Filled 2024-01-17 (×3): qty 5

## 2024-01-17 MED ORDER — PROPOFOL 10 MG/ML IV BOLUS
INTRAVENOUS | Status: AC
  Filled 2024-01-17: qty 20

## 2024-01-17 SURGICAL SUPPLY — 64 items
APPLICATOR COTTON TIP 6 STRL (MISCELLANEOUS) IMPLANT
APPLICATOR COTTON TIP 6IN STRL (MISCELLANEOUS) IMPLANT
BAG COUNTER SPONGE SURGICOUNT (BAG) IMPLANT
BLADE SURG SZ11 CARB STEEL (BLADE) ×1 IMPLANT
CABLE HIGH FREQUENCY MONO STRZ (ELECTRODE) IMPLANT
CHLORAPREP W/TINT 26 (MISCELLANEOUS) ×2 IMPLANT
CLIP APPLIE ROT 10 11.4 M/L (STAPLE) IMPLANT
CLIP APPLIE ROT 13.4 12 LRG (CLIP) IMPLANT
COVER SURGICAL LIGHT HANDLE (MISCELLANEOUS) ×1 IMPLANT
DEVICE SUT QUICK LOAD TK 5 (SUTURE) IMPLANT
DEVICE SUT TI-KNOT TK 5X26 (SUTURE) IMPLANT
DEVICE SUTURE ENDOST 10MM (ENDOMECHANICALS) IMPLANT
DISSECTOR BLUNT TIP ENDO 5MM (MISCELLANEOUS) IMPLANT
DRAPE UTILITY XL STRL (DRAPES) ×2 IMPLANT
DRSG TEGADERM 2-3/8X2-3/4 SM (GAUZE/BANDAGES/DRESSINGS) ×6 IMPLANT
ELECT REM PT RETURN 15FT ADLT (MISCELLANEOUS) ×1 IMPLANT
ELECTRODE L-HOOK LAP 45CM DISP (ELECTROSURGICAL) IMPLANT
GAUZE SPONGE 2X2 8PLY STRL LF (GAUZE/BANDAGES/DRESSINGS) IMPLANT
GAUZE SPONGE 4X4 12PLY STRL (GAUZE/BANDAGES/DRESSINGS) IMPLANT
GLOVE BIO SURGEON STRL SZ7.5 (GLOVE) ×1 IMPLANT
GLOVE INDICATOR 8.0 STRL GRN (GLOVE) ×1 IMPLANT
GOWN STRL REUS W/ TWL XL LVL3 (GOWN DISPOSABLE) ×3 IMPLANT
GRASPER SUT TROCAR 14GX15 (MISCELLANEOUS) ×1 IMPLANT
IRRIGATION SUCT STRKRFLW 2 WTP (MISCELLANEOUS) ×1 IMPLANT
KIT BASIN OR (CUSTOM PROCEDURE TRAY) ×1 IMPLANT
KIT TURNOVER KIT A (KITS) IMPLANT
MARKER SKIN DUAL TIP RULER LAB (MISCELLANEOUS) ×1 IMPLANT
MAT PREVALON FULL STRYKER (MISCELLANEOUS) ×1 IMPLANT
NDL SPNL 22GX3.5 QUINCKE BK (NEEDLE) ×1 IMPLANT
NEEDLE SPNL 22GX3.5 QUINCKE BK (NEEDLE) ×1 IMPLANT
PACK UNIVERSAL I (CUSTOM PROCEDURE TRAY) ×1 IMPLANT
RELOAD STAPLE 60 3.6 BLU REG (STAPLE) ×1 IMPLANT
RELOAD STAPLE 60 3.8 GOLD REG (STAPLE) IMPLANT
RELOAD STAPLE 60 4.1 GRN THCK (STAPLE) ×1 IMPLANT
RELOAD STAPLE 60 BLK VRY/THCK (STAPLE) IMPLANT
RELOAD STAPLER 60MM BLK (STAPLE) IMPLANT
RELOAD STAPLER BLUE 60MM (STAPLE) ×5 IMPLANT
RELOAD STAPLER GOLD 60MM (STAPLE) ×1 IMPLANT
RELOAD STAPLER GREEN 60MM (STAPLE) IMPLANT
SCISSORS LAP 5X45 EPIX DISP (ENDOMECHANICALS) IMPLANT
SEALANT SURGICAL APPL DUAL CAN (MISCELLANEOUS) IMPLANT
SET TUBE SMOKE EVAC HIGH FLOW (TUBING) ×1 IMPLANT
SHEARS HARMONIC 45 ACE (MISCELLANEOUS) ×1 IMPLANT
SLEEVE ADV FIXATION 5X100MM (TROCAR) ×2 IMPLANT
SLEEVE GASTRECTOMY 40FR VISIGI (MISCELLANEOUS) ×1 IMPLANT
SOLUTION ANTFG W/FOAM PAD STRL (MISCELLANEOUS) ×1 IMPLANT
SPIKE FLUID TRANSFER (MISCELLANEOUS) ×1 IMPLANT
STAPLE LINE REINFORCEMENT LAP (STAPLE) IMPLANT
STAPLER ECHELON BIOABSB 60 FLE (MISCELLANEOUS) IMPLANT
STAPLER ECHELON LONG 3000 60 (ENDOMECHANICALS) IMPLANT
STAPLER ECHELON LONG 60 440 (INSTRUMENTS) IMPLANT
STRIP CLOSURE SKIN 1/2X4 (GAUZE/BANDAGES/DRESSINGS) ×1 IMPLANT
SUT MNCRL AB 4-0 PS2 18 (SUTURE) ×1 IMPLANT
SUT SURGIDAC NAB ES-9 0 48 120 (SUTURE) IMPLANT
SUT VICRYL 0 TIES 12 18 (SUTURE) ×1 IMPLANT
SYR 20ML LL LF (SYRINGE) ×1 IMPLANT
SYR 50ML LL SCALE MARK (SYRINGE) ×1 IMPLANT
SYSTEM KII OPTICAL ACCESS 15MM (TROCAR) ×1 IMPLANT
TOWEL OR 17X26 10 PK STRL BLUE (TOWEL DISPOSABLE) ×1 IMPLANT
TROCAR ADV FIXATION 5X100MM (TROCAR) ×1 IMPLANT
TROCAR XCEL NON-BLD 5MMX100MML (ENDOMECHANICALS) ×1 IMPLANT
TROCAR Z-THREAD OPTICAL 5X100M (TROCAR) IMPLANT
TUBING CONNECTING 10 (TUBING) ×2 IMPLANT
TUBING ENDO SMARTCAP (MISCELLANEOUS) ×1 IMPLANT

## 2024-01-17 NOTE — Plan of Care (Signed)

## 2024-01-17 NOTE — Op Note (Signed)
   Patient: Lisa Schroeder (Feb 25, 1976, [id number])  Date of Surgery: 01/17/2024  Preoperative Diagnosis: MORBID OBESISTY   Postoperative Diagnosis: MORBID OBESISTY   Surgical Procedure: Upper Endoscopy   Surgeon: Teddie Favre, MD  Anesthesiologist: Leslye Rast, MD CRNA: Raylene Calamity, CRNA; Virgil Griffiths, CRNA   Anesthesia: General   Fluids:  No intake/output data recorded.  Complications: None  Drains:  None  Specimen: None   Indications for Procedure: Lisa Schroeder is a 48 y.o. female undergoing sleeve gastrectomy and an EGD was requested to evaluate foregut anatomy intraoperatively.  Description of Procedure: During the procedure, I scrubbed out and obtained the Olympus endoscope. I gently placed endoscope in the patient's oropharynx and gently glided it down the esophagus without any difficulty under direct visualization.  The scope was advanced as far as the pylorus and then slowly withdrawn to inspect the foregut anatomy.  Dr. Elvan Hamel had placed saline in the upper abdomen and all staple lines were submerged to ensure no air leak. There was no evidence of bubbles. There was no evidence of intraluminal bleeding and the mucosa appeared healthy.  The lumen was widely patent without evidence of stricture.  The intraluminal insufflation was decompressed. The scope was withdrawn. The patient tolerated this portion of the procedure well. Please see Dr Ruddy Corral operative note for details regarding the remainder of the procedure.    Teddie Favre, MD General, Bariatric, & Minimally Invasive Surgery [facility], [place]

## 2024-01-17 NOTE — Anesthesia Procedure Notes (Signed)
 Procedure Name: Intubation Date/Time: 01/17/2024 11:34 AM  Performed by: Raylene Calamity, CRNAPre-anesthesia Checklist: Patient identified, Emergency Drugs available, Suction available and Patient being monitored Patient Re-evaluated:Patient Re-evaluated prior to induction Oxygen Delivery Method: Circle System Utilized Preoxygenation: Pre-oxygenation with 100% oxygen Induction Type: IV induction Ventilation: Mask ventilation without difficulty Laryngoscope Size: Mac and 3 Grade View: Grade I Tube type: Oral Tube size: 7.0 mm Number of attempts: 1 Airway Equipment and Method: Stylet and Oral airway Placement Confirmation: ETT inserted through vocal cords under direct vision, positive ETCO2 and breath sounds checked- equal and bilateral Secured at: 22 cm Tube secured with: Tape Dental Injury: Teeth and Oropharynx as per pre-operative assessment

## 2024-01-17 NOTE — Progress Notes (Signed)
   Name: Lisa Schroeder                Patient MRN: [id number] DOA: 01/17/2024   Patient seen in room SS 11  Patient alert and oriented, awaiting in short stay for Gastric sleeve surgery with Dr. Elvan Hamel. Husband at bedside.   Vital signs within the last 4 hours:    01/17/2024    8:15 AM 01/17/2024    8:13 AM 01/09/2024   10:00 AM  Vitals with BMI  Height 5' 4.5" 5' 4.5" 5' 4.5"  Weight 252 lbs 252 lbs 252 lbs  BMI 42.6 42.6 42.6  Systolic  142 135  Diastolic  81 92  Pulse  85 90     Lab Results:   Latest Reference Range & Units 01/09/24 10:00  WBC 4.0 - 10.5 K/uL 9.8  RBC 3.87 - 5.11 MIL/uL 4.96  Hemoglobin 12.0 - 15.0 g/dL 40.9  HCT 81.1 - 91.4 % 42.7  MCV 80.0 - 100.0 fL 86.1  MCH 26.0 - 34.0 pg 28.4  MCHC 30.0 - 36.0 g/dL 78.2  RDW 95.6 - 21.3 % 13.7  Platelets 150 - 400 K/uL 348  nRBC 0.0 - 0.2 % 0.0     Assessment / Plan Recommendations:  Discussed QI "Goals for Discharge" document with patient including ambulation in halls, Incentive Spirometry use every hour, and oral care.  Also discussed pain and nausea control.  Encouraged head of bed at least 30 degree.  BSTOP education provided including BSTOP information guide, "Guide for Pain Management after your Bariatric Procedure".  Diet progression education provided including "Bariatric Surgery Post-Op Food Plan Phase 1: Liquids".  Questions addressed, no further concerns at this time.   Thank you,  Lynnie Saucier, RN, MSN Bariatric Nurse Coordinator [phone] (office)

## 2024-01-17 NOTE — Transfer of Care (Signed)
 Immediate Anesthesia Transfer of Care Note  Patient: Lisa Schroeder  Procedure(s) Performed: GASTRECTOMY, SLEEVE, LAPAROSCOPIC ENDOSCOPY, UPPER GI TRACT  Patient Location: PACU  Anesthesia Type:General  Level of Consciousness: sedated  Airway & Oxygen Therapy: Patient Spontanous Breathing  Post-op Assessment: Report given to RN  Post vital signs: Reviewed and stable  Last Vitals:  Vitals Value Taken Time  BP 126/67 01/17/24 1253  Temp    Pulse 66 01/17/24 1256  Resp 18 01/17/24 1256  SpO2 100 % 01/17/24 1256  Vitals shown include unfiled device data.  Last Pain:  Vitals:   01/17/24 0815  TempSrc:   PainSc: 0-No pain         Complications: No notable events documented.

## 2024-01-17 NOTE — Progress Notes (Signed)
 PHARMACY CONSULT FOR:  Risk Assessment for Post-Discharge VTE Following Bariatric Surgery  Procedure:   Sleeve Gastrectomy  Age (years):   48  BMI (kg/m2):   42.6  Operation duration (minutes):   50    [ X ] Female  [   ]  Black race  [   ]  History of VTE requiring treatment  [   ]  Hypercoagulable condition  [   ]  Liver disorder  [   ]  Pre-op venous stasis  [   ]  Previous foregut or bariatric surgery  [   ]  Pre-op partially or fully dependent health status   [   ]  Post-op surgical site infection  [   ]  Transfusion intra- or post-op (up to 72 hr)  [   ]  Unplanned readmission  [   ]  Unplanned re-operation  [   ]  Post-op GI perforation, leak, or obstruction    Predicted probablility of 30-day post-discharge VTE*:    0.25%  *estimated using the [facility] Calculator    Other patient-specific factors to consider:  N/a    Recommendation for Discharge:  No discharge VTE prophylaxis indicated    Lisa Schroeder is a 48 y.o. female who underwent laparoscopic sleeve gastrectomy on 01/17/24    Case start: 1151 Case end: 1241   No Known Allergies  Patient Measurements: Height: 5' 4.5" (163.8 cm) Weight: 114.3 kg (252 lb) IBW/kg (Calculated) : 55.85 Body mass index is 42.59 kg/m.  No results for input(s): "WBC", "HGB", "HCT", "PLT", "APTT", "CREATININE", "LABCREA", "CREAT24HRUR", "MG", "PHOS", "ALBUMIN", "PROT", "AST", "ALT", "ALKPHOS", "BILITOT", "BILIDIR", "IBILI" in the last 72 hours. Estimated Creatinine Clearance: 91.7 mL/min (by C-G formula based on SCr of 0.96 mg/dL).    Past Medical History:  Diagnosis Date   Abnormal Pap smear of cervix    ADHD (attention deficit hyperactivity disorder)    Arthritis    Asthma    exercise induced asthma   Bipolar disorder (HCC)    Depression    HPV in female    Hypothyroidism    Pre-diabetes      Medications Prior to Admission  Medication Sig Dispense Refill Last Dose/Taking    diphenhydrAMINE  (BENADRYL ) 25 MG tablet Take 25 mg by mouth daily as needed for allergies.   Past Week   lamoTRIgine  (LAMICTAL ) 100 MG tablet Take 2 tablets (200 mg total) by mouth daily. 180 tablet 1 01/17/2024 at  6:00 AM   levonorgestrel  (MIRENA ) 20 MCG/DAY IUD 1 each by Intrauterine route once.   Taking   levothyroxine  (SYNTHROID ) 50 MCG tablet Take 50 mcg by mouth daily before breakfast.   01/17/2024 at  6:00 AM   LORazepam  (ATIVAN ) 1 MG tablet Take 1 tablet (1 mg total) by mouth every 8 (eight) hours as needed for anxiety. 30 tablet 2 01/17/2024 at  6:00 AM   Lurasidone  HCl 120 MG TABS Take 1 tablet (120 mg total) by mouth daily at 12 noon. (Patient taking differently: Take 60 mg by mouth at bedtime.) 90 tablet 0 01/16/2024   Multiple Vitamins-Minerals (MULTIVITAMIN WITH MINERALS) tablet Take 1 tablet by mouth daily.   Past Week       Lisa Schroeder A 01/17/2024,3:25 PM

## 2024-01-17 NOTE — Op Note (Signed)
 01/17/2024 Lisa Schroeder 1975-11-27 [id number]   PRE-OPERATIVE DIAGNOSIS:   Severe obesity (BMI 43)  Vitamin D  insufficiency  Bipolar affective disorder in remission (CMS/HHS-HCC)  Pure hypercholesterolemia  Prediabetes  Hypothyroidism, acquired   POST-OPERATIVE DIAGNOSIS:  same  PROCEDURE:  Procedure(s): LAPAROSCOPIC SLEEVE GASTRECTOMY  UPPER GI ENDOSCOPY Laparoscopic bilateral TAP block  SURGEON:  Surgeon(s): Fran Imus, MD FACS FASMBS  ASSISTANTS: Teddie Favre MD FACS  ANESTHESIA:   general  DRAINS: none   BOUGIE: 40 fr ViSiGi  LOCAL MEDICATIONS USED:   bupivicaine  EBL: minima  SPECIMEN:  Source of Specimen:  Greater curvature of stomach  DISPOSITION OF SPECIMEN:  PATHOLOGY  COUNTS:  YES  INDICATION FOR PROCEDURE: This is a very pleasant 48 y.o.-year-old morbidly obese female who has had unsuccessful attempts for sustained weight loss. The patient presents today for a planned laparoscopic sleeve gastrectomy with upper endoscopy. We have discussed the risk and benefits of the procedure extensively preoperatively. Please see my separate notes.  PROCEDURE: After obtaining informed consent and receiving 5000 units of subcutaneous heparin , the patient was brought to the operating room at [facility] and placed supine on the operating room table. General endotracheal anesthesia was established. Sequential compression devices were placed. A orogastric tube was placed. The patient's abdomen was prepped and draped in the usual standard surgical fashion. The patient received preoperative IV antibiotic. A surgical timeout was performed. ERAS protocol used.   Access to the abdomen was achieved using a 5 mm 0 laparoscope thru a 5 mm trocar In the left upper Quadrant 2 fingerbreadths below the left subcostal margin using the Optiview technique. Pneumoperitoneum was smoothly established up to 15 mm of mercury. The laparoscope was advanced and the abdominal  cavity was surveilled. The patient was then placed in reverse Trendelenburg.   A 5 mm trocar was placed slightly above and to the left of the umbilicus under direct visualization.  The [facility] liver retractor was placed under the left lobe of the liver through a 5 mm trocar incision site in the subxiphoid position. A 5 mm trocar was placed in the lateral right upper quadrant along with a 15 mm trocar in the mid right abdomen. A final 5 mm trocar was placed in the lateral LUQ.  All under direct visualization after exparel  had been infiltrated in bilateral lateral upper abdominal walls as a TAP block for postoperative pain relief.  The stomach was inspected. It was completely decompressed and the orogastric tube was removed.  There was no small anterior dimple that was obviously visible. Her preop uppper GI showed no hiatal hernia.  We identified the pylorus and measured 6 cm proximal to the pylorus and identified an area of where we would start taking down the short gastric vessels. Harmonic scalpel was used to take down the short gastric vessels along the greater curvature of the stomach. We were able to enter the lesser sac. We continued to march along the greater curvature of the stomach taking down the short gastrics. As we approached the gastrosplenic ligament we took care in this area not to injure the spleen. We were able to take down the entire gastrosplenic ligament. We then mobilized the fundus away from the left crus of diaphragm. There were not any significant posterior gastric avascular attachments. This left the stomach completely mobilized. No vessels had been taken down along the lesser curvature of the stomach.  We then reidentified the pylorus. A 40Fr ViSiGi was then placed in the oropharynx and advanced  down into the stomach and placed in the distal antrum and positioned along the lesser curvature. It was placed under suction which secured the 40Fr ViSiGi in place along the lesser curve.  Then using the Ethicon echelon 60 mm stapler with a gold load with ethicon staple line reinforcement (ESLR), I placed a stapler along the antrum approximately 5 cm from the pylorus. The stapler was angled so that there is ample room at the angularis incisura. I then fired the first staple load after inspecting it posteriorly to ensure adequate space both anteriorly and posteriorly. At this point I still was not completely past the angularis so with a blue load with ESLR, I placed the stapler in position just inside the prior stapleline. We then rotated the stomach to insure that there was adequate anteriorly as well as posteriorly. The stapler was then fired.  The echelon stapler was then repositioned with a 60 mm blue load with ESLR and we continued to march up along the ViSiGi. My assistant was holding traction along the greater curvature stomach along the cauterized short gastric vessels ensuring that the stomach was symmetrically retracted. Prior to each firing of the staple, we rotated the stomach to ensure that there is adequate stomach left.  As we approached the fundus, I used 60 mm blue cartridge with ESLR aiming  lateral to the GE junction after mobilizing some of the esophageal fat pad.  The sleeve was inspected. There is no evidence of cork screw. The staple line appeared hemostatic. The CRNA inflated the ViSiGi to the green zone and the upper abdomen was flooded with saline. There were no bubbles. The sleeve was decompressed and the ViSiGi removed.  .  My assistant scrubbed out and performed an upper endoscopy. The sleeve easily distended with air and the scope was easily advanced to the pylorus. There is no evidence of internal bleeding or cork screwing. There was no narrowing at the angularis. There is no evidence of bubbles. Please see his operative note for further details. The gastric sleeve was decompressed and the endoscope was removed.  The greater curvature the stomach was grasped with a  laparoscopic grasper and removed from the 15 mm trocar site.  The liver retractor was removed. I then closed the 15 mm trocar site with 1 interrupted 0 Vicryl sutures through the fascia using the endoclose. The closure was viewed laparoscopically and it was airtight. Remaining Exparel  was then infiltrated in the preperitoneal spaces around the trocar sites. Pneumoperitoneum was released. All trocar sites were closed with a 4-0 Monocryl in a subcuticular fashion followed by the application of steri-strips, and bandaids. The patient was extubated and taken to the recovery room in stable condition. All needle, instrument, and sponge counts were correct x2. There are no immediate complications  (0) 60 mm green with ESLR (1) 60 mm gold with ESLR (5) 60 mm blue with ESLR  PLAN OF CARE: Admit to inpatient   PATIENT DISPOSITION:  PACU - hemodynamically stable.   Delay start of Pharmacological VTE agent (>24hrs) due to surgical blood loss or risk of bleeding:  no  Marianna Shirk. Elvan Hamel, MD, FACS FASMBS General, Bariatric, & Minimally Invasive Surgery [facility], [place]

## 2024-01-17 NOTE — Interval H&P Note (Signed)
 History and Physical Interval Note:  01/17/2024 10:48 AM  Lisa Schroeder  has presented today for surgery, with the diagnosis of MORBID OBESISTY.  The various methods of treatment have been discussed with the patient and family. After consideration of risks, benefits and other options for treatment, the patient has consented to  Procedure(s): GASTRECTOMY, SLEEVE, LAPAROSCOPIC (N/A) ENDOSCOPY, UPPER GI TRACT (N/A) as a surgical intervention.  The patient's history has been reviewed, patient examined, no change in status, stable for surgery.  I have reviewed the patient's chart and labs.  Questions were answered to the patient's satisfaction.     Aldean Hummingbird

## 2024-01-17 NOTE — H&P (Signed)
 PROVIDER: Jennifr Gaeta Veldon German, MD  MRN: [id number] DOB: Oct 09, 1975 DATE OF ENCOUNTER: 01/03/2024 Subjective  Chief Complaint: RETURN WEIGHT LOSS   History of Present Illness Lisa Schroeder is a 48 year old female who presents for long term followup for severe obesity and upcoming for sleeve gastrectomy.  She has not experienced any new medical issues since her last visit in January, and there have been no emergency room visits or hospitalizations. She was briefly on trazodone for sleep, which she discontinued as her sleep improved. No chest pain, shortness of breath, dysphagia, heartburn, or abdominal pain. She has regular bowel movements, typically twice a day.  She has completed necessary preoperative evaluations, including consultations with a dietitian and a psychologist. A recent upper GI study showed no hiatal hernia or reflux, and her chest X-ray was normal. A mammogram returned a good report, and a surveillance ultrasound for neck lymph nodes showed stability, attributed to a resolved dental issue following a root canal. A follow-up with her ENT is scheduled in six months.  Regarding birth control, she was previously on the pill for period regulation but has since stopped, now relying on an IUD. She plans to take additional contraceptive measures during the time anesthesia medication might affect the IUD's effectiveness post-surgery.  She has never had surgery before and is anxious about the procedure, inquiring about the possibility of receiving a mild sedative on the day of surgery to help with nerves. She is aware of the risk of blood clots post-surgery and understands the importance of mobility to mitigate this risk.  She has a history of vaping but quit a few months ago.  Review of Systems: A complete review of systems was obtained from the patient. I have reviewed this information and discussed as appropriate with the patient. See HPI as well for other ROS.  ROS  Medical  History: Past Medical History: Diagnosis Date Anxiety Asthma without status asthmaticus (HHS-HCC) Bipolar disorder (CMS/HHS-HCC) Depression  Patient Active Problem List Diagnosis Bipolar affective disorder in remission (CMS/HHS-HCC) ADD (attention deficit disorder) Hypothyroidism, acquired Pure hypercholesterolemia BMI 37.0-37.9, adult Elevated hemoglobin A1c  Past Surgical History: Procedure Laterality Date BURNS 1983 2ND AND 3RD-DEGREE BURNS CHILDBIRTH 2007   No Known Allergies  Current Outpatient Medications on File Prior to Visit Medication Sig Dispense Refill ergocalciferol , vitamin D2, 1,250 mcg (50,000 unit) capsule Take 1 capsule (50,000 Units total) by mouth once a week for 42 days 6 capsule 0 lamoTRIgine  (LAMICTAL ) 100 MG tablet Take 200 mg by mouth once daily levonorgestreL  (MIRENA  52 MG) IUD Insert 1 each into the uterus once levothyroxine  (SYNTHROID ) 50 MCG tablet Take 1 tablet (50 mcg total) by mouth once daily Take on an empty stomach with a glass of water  at least 30-60 minutes before breakfast. 90 tablet 1 LORazepam  (ATIVAN ) 1 MG tablet Take 1 mg by mouth every 8 (eight) hours as needed for Anxiety lamoTRIgine  (LAMICTAL ) 100 MG tablet TAKE 2&1/2 TABLETS BY MOUTH AT BEDTIME (Patient taking differently: Take 200 mg by mouth at bedtime) 75 tablet 3 LATUDA  120 mg tablet Take 60 mg by mouth at bedtime (Patient not taking: Reported on 01/03/2024) M-NATAL PLUS tablet Take 1 tablet by mouth once daily (Patient not taking: Reported on 10/14/2023) meloxicam (MOBIC) 7.5 MG tablet Take 1 tablet by mouth once daily norethindrone  (MICRONOR ) 0.35 mg tablet Take 0.35 mg by mouth once daily traZODone (DESYREL) 50 MG tablet TAKE 1 TABLET BY MOUTH EVERYDAY AT BEDTIME 90 tablet 2  No current facility-administered medications on file  prior to visit.  Family History Problem Relation Age of Onset Hypothyroidism Mother Diabetes Father Myocardial Infarction (Heart attack)  Father   Social History  Tobacco Use Smoking Status Former Current packs/day: 0.00 Types: Cigarettes Quit date: 06/18/2008 Years since quitting: 15.5 Smokeless Tobacco Never   Social History  Socioeconomic History Marital status: Divorced Tobacco Use Smoking status: Former Current packs/day: 0.00 Types: Cigarettes Quit date: 06/18/2008 Years since quitting: 15.5 Smokeless tobacco: Never Vaping Use Vaping status: Never Used Substance and Sexual Activity Alcohol use: Yes Comment: SOCIALLY Drug use: No Sexual activity: Defer  Social Drivers of Health  Financial Resource Strain: Low Risk (07/25/2023) Overall Financial Resource Strain (CARDIA) Difficulty of Paying Living Expenses: Not hard at all Food Insecurity: No Food Insecurity (07/25/2023) Hunger Vital Sign Worried About Running Out of Food in the Last Year: Never true Ran Out of Food in the Last Year: Never true Transportation Needs: No Transportation Needs (07/25/2023) PRAPARE - Contractor (Medical): No Lack of Transportation (Non-Medical): No Housing Stability: Low Risk (10/14/2023) Housing Stability Vital Sign Unable to Pay for Housing in the Last Year: No Number of Times Moved in the Last Year: 0 Homeless in the Last Year: No  Objective:  Vitals: 01/03/24 1404 01/03/24 1405 BP: 127/89 Pulse: 108 Temp: 36.8 C (98.2 F) SpO2: 99% Weight: (!) 118.8 kg (261 lb 12.8 oz) Height: 163.8 cm (5' 4.5") PainSc: 0-No pain PainLoc: Abdomen  Body mass index is 44.24 kg/m.  Gen: alert, NAD, non-toxic appearing; severe obesity Pupils: equal, no scleral icterus Pulm: Lungs clear to auscultation, symmetric chest rise CV: regular rate and rhythm Abd: soft, nontender, nondistended. No cellulitis. No incisional hernia Ext: no edema, Skin: no rash, no jaundice  Labs, Imaging and Diagnostic Testing:  Breast mammography November 21, 2023-normal Head neck ultrasound December 12, 2023 -  Mildly prominent left neck lymph nodes do not demonstrate significant growth since 05/01/2021, which favors a benign etiology such is inflammation. Upper GI October 31, 2023-no hiatal hernia or reflux or dysmotility Chest x-ray October 31, 2023-negative  Assessment and Plan: Diagnoses and all orders for this visit:  Severe obesity (CMS/HHS-HCC)  Vitamin D  insufficiency  Bipolar affective disorder in remission (CMS/HHS-HCC)  Pure hypercholesterolemia  Prediabetes  Hypothyroidism, acquired    Assessment & Plan Severe obesity She is scheduled for a sleeve gastrectomy later this month. She has received evaluation by the dietitian and psychologist. Preoperative evaluations, including an upper GI and chest X-ray, were normal. She has attended preoperative education and is aware of the dietary modifications required post-surgery. She expressed concerns about the surgery, including the risk of mortality and the need for a sedative to manage preoperative anxiety. The surgical procedure involves using a surgical stapler that places six rows of staples and transects the stomach tissue. There is a small risk of mortality (<09/998). The risk of blood clots persists for about one month postoperatively. A risk calculation score will be used on the day of surgery to determine the need for anticoagulant injections. - Proceed with sleeve gastrectomy as planned. - Ensure adherence to preoperative meal plan: high protein, low carbohydrate diet with one meal replaced by a protein shake. - Provide preoperative sedative if needed on the day of surgery after consent is obtained. - Educate on postoperative care: liquid diet for two weeks, gradual transition to solids, and importance of slow eating and drinking. - Monitor for thromboembolism postoperatively; assess risk on the day of surgery and consider anticoagulant injections if high risk. - Encourage ambulation  at least once per hour during the daytime  post-surgery. -Offered to rediscussed the steps of surgery including risks but she declined. -Patient read over the surgical consent form and signed  Anxiety related to surgery She expressed anxiety about the upcoming surgery, particularly as it is her first surgical procedure. She inquired about the possibility of receiving a mild sedative to help manage her nerves on the day of surgery. - Provide mild sedative on the day of surgery if requested after consent is obtained.  Lymphadenopathy A surveillance ultrasound for cervical lymph nodes showed stable size. She suspects the lymphadenopathy was related to a dental issue, which has since been addressed with a root canal. A follow-up with an ENT is scheduled in six months. - Follow up with ENT in six months.  Contraceptive management She was informed that a medication given by anesthesia may reduce the effectiveness of her IUD for up to a month post-surgery. She was advised to use additional contraceptive measures during this time if sexually active. The medication is used to reverse the paralytic given during surgery, and its effect on contraceptives is temporary. - Advise use of additional contraceptive measures for one month post-surgery if sexually active.  This patient encounter took 25 minutes today to perform the following: take history, perform exam, review outside records, interpret imaging, counsel the patient on their diagnosis and document encounter, findings & plan in the EHR  No follow-ups on file.  Marianna Shirk. Elvan Hamel MD FACS General, Minimally Invasive, & Bariatric Surgery Electronically signed by Georgeanne King, MD at 01/03/2024 2:34 PM EDT

## 2024-01-17 NOTE — Anesthesia Preprocedure Evaluation (Addendum)
 Anesthesia Evaluation  Patient identified by MRN, date of birth, ID band Patient awake    Reviewed: Allergy & Precautions, NPO status , Patient's Chart, lab work & pertinent test results, reviewed documented beta blocker date and time   History of Anesthesia Complications Negative for: history of anesthetic complications  Airway Mallampati: III  TM Distance: >3 FB     Dental no notable dental hx. (+)    Pulmonary neg shortness of breath, asthma , neg COPD, neg recent URI, former smoker   breath sounds clear to auscultation       Cardiovascular (-) angina (-) CAD, (-) Past MI and (-) Cardiac Stents (-) dysrhythmias (-) pacemaker(-) Cardiac Defibrillator  Rhythm:Regular Rate:Normal     Neuro/Psych neg Seizures PSYCHIATRIC DISORDERS  Depression Bipolar Disorder      GI/Hepatic ,neg GERD  ,,(+) neg Cirrhosis        Endo/Other  diabetesHypothyroidism  Class 3 obesity  Renal/GU Renal disease     Musculoskeletal  (+) Arthritis ,    Abdominal   Peds  Hematology   Anesthesia Other Findings   Reproductive/Obstetrics                              Anesthesia Physical Anesthesia Plan  ASA: 3  Anesthesia Plan: General   Post-op Pain Management:    Induction: Intravenous  PONV Risk Score and Plan: 2 and Ondansetron  and Dexamethasone   Airway Management Planned: Oral ETT and Video Laryngoscope Planned  Additional Equipment:   Intra-op Plan:   Post-operative Plan: Extubation in OR  Informed Consent: I have reviewed the patients History and Physical, chart, labs and discussed the procedure including the risks, benefits and alternatives for the proposed anesthesia with the patient or authorized representative who has indicated his/her understanding and acceptance.     Dental advisory given  Plan Discussed with: CRNA  Anesthesia Plan Comments:          Anesthesia Quick  Evaluation

## 2024-01-18 ENCOUNTER — Other Ambulatory Visit (HOSPITAL_COMMUNITY): Payer: Self-pay

## 2024-01-18 ENCOUNTER — Encounter (HOSPITAL_COMMUNITY): Payer: Self-pay | Admitting: General Surgery

## 2024-01-18 LAB — CBC WITH DIFFERENTIAL/PLATELET
Abs Immature Granulocytes: 0.11 10*3/uL — ABNORMAL HIGH (ref 0.00–0.07)
Basophils Absolute: 0 10*3/uL (ref 0.0–0.1)
Basophils Relative: 0 %
Eosinophils Absolute: 0 10*3/uL (ref 0.0–0.5)
Eosinophils Relative: 0 %
HCT: 42.2 % (ref 36.0–46.0)
Hemoglobin: 14.3 g/dL (ref 12.0–15.0)
Immature Granulocytes: 1 %
Lymphocytes Relative: 7 %
Lymphs Abs: 1.3 10*3/uL (ref 0.7–4.0)
MCH: 28.7 pg (ref 26.0–34.0)
MCHC: 33.9 g/dL (ref 30.0–36.0)
MCV: 84.6 fL (ref 80.0–100.0)
Monocytes Absolute: 1.1 10*3/uL — ABNORMAL HIGH (ref 0.1–1.0)
Monocytes Relative: 6 %
Neutro Abs: 14.5 10*3/uL — ABNORMAL HIGH (ref 1.7–7.7)
Neutrophils Relative %: 86 %
Platelets: 328 10*3/uL (ref 150–400)
RBC: 4.99 MIL/uL (ref 3.87–5.11)
RDW: 13.7 % (ref 11.5–15.5)
WBC: 16.9 10*3/uL — ABNORMAL HIGH (ref 4.0–10.5)
nRBC: 0 % (ref 0.0–0.2)

## 2024-01-18 LAB — COMPREHENSIVE METABOLIC PANEL WITH GFR
ALT: 24 U/L (ref 0–44)
AST: 31 U/L (ref 15–41)
Albumin: 3.8 g/dL (ref 3.5–5.0)
Alkaline Phosphatase: 66 U/L (ref 38–126)
Anion gap: 8 (ref 5–15)
BUN: 8 mg/dL (ref 6–20)
CO2: 24 mmol/L (ref 22–32)
Calcium: 9.1 mg/dL (ref 8.9–10.3)
Chloride: 104 mmol/L (ref 98–111)
Creatinine, Ser: 0.66 mg/dL (ref 0.44–1.00)
GFR, Estimated: >60 mL/min (ref >60)
Glucose, Bld: 131 mg/dL — ABNORMAL HIGH (ref 70–99)
Potassium: 5.1 mmol/L (ref 3.5–5.1)
Sodium: 136 mmol/L (ref 135–145)
Total Bilirubin: 1.3 mg/dL — ABNORMAL HIGH (ref 0.0–1.2)
Total Protein: 7.6 g/dL (ref 6.5–8.1)

## 2024-01-18 LAB — SURGICAL PATHOLOGY

## 2024-01-18 MED ORDER — OXYCODONE HCL 5 MG PO TABS: 5.0000 mg | ORAL_TABLET | Freq: Four times a day (QID) | ORAL | 0 refills | Status: DC | PRN

## 2024-01-18 MED ORDER — ACETAMINOPHEN 500 MG PO TABS: 1000.0000 mg | ORAL_TABLET | Freq: Three times a day (TID) | ORAL | Status: AC

## 2024-01-18 MED ORDER — GABAPENTIN 100 MG PO CAPS: 100.0000 mg | ORAL_CAPSULE | Freq: Two times a day (BID) | ORAL | 0 refills | Status: AC

## 2024-01-18 MED ORDER — OXYCODONE HCL 5 MG PO TABS
5.0000 mg | ORAL_TABLET | Freq: Four times a day (QID) | ORAL | 0 refills | Status: DC | PRN
  Filled 2024-01-18: qty 10, 3d supply, fill #0

## 2024-01-18 MED ORDER — ONDANSETRON 4 MG PO TBDP: 4.0000 mg | ORAL_TABLET | Freq: Four times a day (QID) | ORAL | 0 refills | Status: AC | PRN

## 2024-01-18 MED ORDER — PANTOPRAZOLE SODIUM 40 MG PO TBEC: 40.0000 mg | DELAYED_RELEASE_TABLET | Freq: Every day | ORAL | 0 refills | Status: DC

## 2024-01-18 NOTE — Anesthesia Postprocedure Evaluation (Signed)
 Anesthesia Post Note  Patient: Phynix Horton Sevigny  Procedure(s) Performed: GASTRECTOMY, SLEEVE, LAPAROSCOPIC ENDOSCOPY, UPPER GI TRACT     Patient location during evaluation: PACU Anesthesia Type: General Level of consciousness: awake and alert Pain management: pain level controlled Vital Signs Assessment: post-procedure vital signs reviewed and stable Respiratory status: spontaneous breathing, nonlabored ventilation, respiratory function stable and patient connected to nasal cannula oxygen Cardiovascular status: blood pressure returned to baseline and stable Postop Assessment: no apparent nausea or vomiting Anesthetic complications: no   No notable events documented.  Last Vitals:  Vitals:   01/18/24 0625 01/18/24 0941  BP: 133/80 118/73  Pulse: 66 60  Resp: 18 14  Temp: 37.1 C 36.7 C  SpO2: 99% 100%    Last Pain:  Vitals:   01/18/24 0941  TempSrc: Oral  PainSc:                  Leslye Rast

## 2024-01-18 NOTE — Progress Notes (Signed)
 AVS reviewed w/ pt who verbalized an understanding. No other questions- PIV removed as noted. Pt dressing for d/c to home. Pt's husband is getting the car.

## 2024-01-18 NOTE — Progress Notes (Signed)
   01/18/24 0959  TOC Brief Assessment  Insurance and Status Reviewed  Patient has primary care physician Yes  Home environment has been reviewed resides in private residence  Prior level of function: Independent  Prior/Current Home Services No current home services  Social Drivers of Health Review SDOH reviewed no interventions necessary  Readmission risk has been reviewed Yes  Transition of care needs no transition of care needs at this time

## 2024-01-18 NOTE — Progress Notes (Signed)
 Patient alert and oriented, pain is controlled. Patient is tolerating fluids, advanced to protein shake today, patient is tolerating well. Reviewed Gastric sleeve/bypass discharge instructions with patient and patient is able to articulate understanding. Provided information on BELT program, Support Group, BSTOP-D, and [facility] outpatient pharmacy. Communicated general update of patient status to surgeon. All questions answered. 24hr fluid recall is 780 mL per hydration protocol, bariatric nurse coordinator to make follow-up phone call within one week.

## 2024-01-18 NOTE — Plan of Care (Signed)
  Problem: Activity: Goal: Risk for activity intolerance will decrease Outcome: Adequate for Discharge   Problem: Nutrition: Goal: Adequate nutrition will be maintained Outcome: Adequate for Discharge   Problem: Pain Managment: Goal: General experience of comfort will improve and/or be controlled Outcome: Progressing   Problem: Activity: Goal: Ability to tolerate increased activity will improve Outcome: Adequate for Discharge

## 2024-01-24 ENCOUNTER — Telehealth (HOSPITAL_COMMUNITY): Payer: Self-pay | Admitting: *Deleted

## 2024-01-24 NOTE — Telephone Encounter (Signed)
 1. Tell me about your pain and pain management?      Pt c/o right lower abdominal incisional pain with coughing or sneezing only. Shared with pt it could be the muscle tissue healing, and to contact [facility] if still concerned.      2. Let's talk about fluid intake. How much total fluid are you taking in?    Pt states that s/he is working to meet goal of 65 oz of fluid today, pt gets on average at least 40oz. Pt encouraged to continue to work towards meeting goal. Pt instructed to assess status and suggestions daily utilizing Hydration Action Plan on discharge folder and to call [facility] if in the "red zone".     3. How much protein have you taken in the last day?    Pt states she is meeting the goal of 60g of protein each day with the protein shakes.    4. Have you had nausea? Tell me about when you have experienced nausea and what you did to help?   Pt denies nausea.   5. Has the frequency or color changed with your urine?   Pt states that s/he is urinating "fine" with no changes in frequency or urgency.   6. Tell me what your incisions look like?   "Incisions look fine". Pt denies a fever, chills. Pt states incisions are not swollen, open, or draining. Pt encouraged to call [facility] if incisions change.    7. Have you been passing gas? BM?   Pt states that they are having BMs. Pt stated that her stool had been darker in color. Spoke with patient that it may be due to the iron in the multivitamin, but if she was still concerned to contact her surgeon via [facility]  Pt states that they have had a BM. Pt instructed to take either Miralax or MoM as instructed per "Gastric Bypass/Sleeve Discharge Home Care Instructions". Pt to call surgeon's office if not able to have BM with medication.    8. If a problem or question were to arise who would you call? Do you know contact numbers for [facility], [facility], and [facility]?   Pt knows to call [facility] for surgical, [facility] for nutrition, and [facility] for non-urgent questions or  concerns. Pt denies dehydration symptoms. Pt can describe s/sx of dehydration.   9. How has the walking going?   Pt states s/he is walking around and able to be active without difficulty.   10. How are your vitamins and calcium going? How are you taking them?    Pt states that s/he is taking his/her supplements and vitamins without difficulty.

## 2024-01-31 ENCOUNTER — Encounter: Attending: Internal Medicine | Admitting: Dietician

## 2024-01-31 ENCOUNTER — Encounter: Payer: Self-pay | Admitting: Dietician

## 2024-01-31 VITALS — Ht 64.5 in | Wt 240.3 lb

## 2024-01-31 DIAGNOSIS — E669 Obesity, unspecified: Secondary | ICD-10-CM | POA: Insufficient documentation

## 2024-01-31 NOTE — Progress Notes (Signed)
 2 Week Post-Operative Nutrition Follow-up   Patient was seen on 01/31/2024 for Post-Operative Nutrition education at the [facility].    Surgery date: 01/17/2024 Surgery type: Sleeve Gastrectomy  Anthropometrics  Start weight at [facility]: 252 lbs (date: 11/07/2023)  Height: 5'4.5 in Weight today: 240.3 lbs   Clinical   Pharmacotherapy: History of weight loss medication used: phentermine    Medical hx: Obesity , High cholesterol Medications: multivitamin , B- complex   Labs: A1c - 6 , cholesterol - 222 , ldl -140  Notable signs/symptoms: none  Bowel Habits: Every day to every other day no complaints   Body Composition Scale 01/31/2024  Current Body Weight 240.3  Total Body Fat % 44.3  Visceral Fat 15  Fat-Free Mass % 55.7   Total Body Water  % 42.3  Muscle-Mass lbs 31.8  BMI 40.6  Body Fat Displacement          Torso  lbs 65.9         Left Leg  lbs 13.1         Right Leg  lbs 13.1         Left Arm  lbs 6.5         Right Arm  lbs 6.5      The following the learning objectives were met by the patient during this course: Identifies Soft Prepped Plan Advancement Guide  Identifies Soft, High Proteins (Phase 1), beginning 2 weeks post-operatively to 3 weeks post-operatively Identifies Additional Soft High Proteins, soft non-starchy vegetables, fruits and starches (Phase 2), beginning 3 weeks post-operatively to 3 months post-operatively Identifies appropriate sources of fluids, proteins, vegetables, fruits and starches Identifies appropriate fat sources and healthy verses unhealthy fat types   States protein, vegetable, fruit and starch recommendations and appropriate sources post-operatively Identifies the need for appropriate texture modifications, mastication, and bite sizes when consuming solids Identifies appropriate fat consumption and sources Identifies appropriate multivitamin and calcium sources post-operatively Describes the need for  physical activity post-operatively and will follow MD recommendations States when to call healthcare provider regarding medication questions or post-operative complications   Handouts given during class include: Soft Prepped Plan Advancement Guide   Follow-Up Plan: Patient will follow-up at [facility] in 10 weeks for 3 month post-op nutrition visit for diet advancement per MD.

## 2024-02-09 ENCOUNTER — Telehealth: Payer: Self-pay | Admitting: Dietician

## 2024-02-09 NOTE — Telephone Encounter (Signed)
 RD called pt to verify fluid intake once starting soft, solid proteins 2 week post-bariatric surgery.   Daily Fluid intake:  Daily Protein intake:  Bowel Habits:   Concerns/issues:   Left Voice Message with call back number

## 2024-02-27 ENCOUNTER — Telehealth: Payer: Self-pay | Admitting: Skilled Nursing Facility1

## 2024-02-27 DIAGNOSIS — E669 Obesity, unspecified: Secondary | ICD-10-CM

## 2024-02-27 NOTE — Telephone Encounter (Signed)
 Called pt in response to her email stating she is having trouble getting her fluids in.   Pt states she feels she is hungry every 2 hours. Pt states she feels she gets 40 ounces per day.  Advice given: Add flavors, change the size of your bottle, change the temperature of your fluid.   Pt states since adding flavor she has gotten more fluids in.   Pt states she was at the same weight for 10 pounds.   Pt states she has great energy. Pt states her clothes fit better. Pt states she has swimming 3 times a week.   Pt states she plans to start BELT soon.   It seems the pt is doing very well at this point and even having a bowel movement daily.

## 2024-03-05 ENCOUNTER — Encounter: Payer: Self-pay | Admitting: Psychiatry

## 2024-03-05 ENCOUNTER — Ambulatory Visit (INDEPENDENT_AMBULATORY_CARE_PROVIDER_SITE_OTHER): Payer: BC Managed Care – PPO | Admitting: Psychiatry

## 2024-03-05 DIAGNOSIS — F902 Attention-deficit hyperactivity disorder, combined type: Secondary | ICD-10-CM | POA: Diagnosis not present

## 2024-03-05 DIAGNOSIS — F319 Bipolar disorder, unspecified: Secondary | ICD-10-CM | POA: Diagnosis not present

## 2024-03-05 DIAGNOSIS — F4001 Agoraphobia with panic disorder: Secondary | ICD-10-CM | POA: Diagnosis not present

## 2024-03-05 MED ORDER — LURASIDONE HCL 120 MG PO TABS: 120.0000 mg | ORAL_TABLET | Freq: Every day | ORAL | 0 refills | Status: DC

## 2024-03-05 MED ORDER — LAMOTRIGINE 100 MG PO TABS: 200.0000 mg | ORAL_TABLET | Freq: Every day | ORAL | 1 refills | Status: DC

## 2024-03-05 NOTE — Progress Notes (Signed)
 Lisa Schroeder [id number] November 11, 1975 48 y.o.     Subjective:   Patient ID:  Lisa Schroeder is a 48 y.o. (DOB August 07, 1976) female.  Chief Complaint:  Chief Complaint  Patient presents with   Follow-up    HPI Lisa Schroeder presents to the office today for follow-up of bipolar disorder and anxiety disorders.  visit June 2020.  She could not afford Metadate  so she was given a prescription for Concerta  36 mg daily.  No other med changes.  She remained on Abilify  5 mg daily, lamotrigine  200 mg daily.   She called May 22, 2019 stating that she was having panic attacks and asked for prescription for Ativan .  It was okayed for her to use sparingly.   seen October 05, 2019.  She described a pattern of hypersexuality as well as some other manic symptoms that she and her therapist had discussed.  We decided to switch from Abilify  to Depakote  as a mood stabilizer.   11/16/2019 appointment with the following noted: Did switch sx.  Episodes mania are gone but more depression.  Couple weeks ago didn't want to leave the house.  Abilify  did better with the depression. No manic sx in the last 3 weeks.  No further hypersexuality resolved.  Sleeping well.  Some racing thoughts but better.  Not irritable..  Some anxiety situational.   Concerta  affordable. 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   Doing well without major issues.    Last visit changed to Metadate  CD and that worked well but over $100.  Better energy and focus and more productive.  Tolerated well.  Ran out.  Dose seems good. Does ok without stimulant but better focus and energy and happier on it.  Be nice if insurance would cover it. Plan:Unfortunately she's been more depressed and has a tremor. Increase lamotrigine  to 150 mg daily to help with depression BC tremor reduce Depakote  to 750 mg HS.  Disc risk return of mania. Follow-up 8 weeks.   March 28, 2020 appointment with the following  noted Fine.  Hardly ever take Concerta  bc it caused the tremor. Disc goal to get pregnant and need to get off Depakote . Rare panic.  Tolerates Ativan .  Ok off Concerta . Not depressed.  No manic behavior. Plan: Wean Depakote  for pregnancy over 10 days.  Disc risk of this causing birth defects etc.  Disc risk mood swings without it.  Call. First step would be to increase lamotrigine .   TC 06/06/20 Pt with rapid mood swings off Depakote .  Might be pregnant.  Needs another stabilizer.  Took Latuda  before but stopped DT cost.  Agrees to restart bc it's one of safest in pregnancy.  Will send in RX for 40 mg Latuda .   07/02/2020 appointment with the following noted: Not pregnant.  "I feel 10 times better" in a few days.  Happier and more productive.  No crying anymore. No significant anger like before.  Sleep is good. Last Friday was dizzy and told she could be having SE.  He told her to cut Latuda  to 20 and then increase it back to 40 mg.  Back on 40 mg for 4-5 days.   Plan: Off Depakote  and on Latuda  & lamotrigine   bc pursuing pregnancy and cannot have stability without medication.  10/01/2020 appointment with the following noted: Mood pretty stable except occ moodiness with PMS.  No sig depression.  Anxiety manageable with 2-3 Ativan  including when flying.  No major  problems.  No SE. Pursuing pregnancy.   Plan: no changes  02/19/21 appt noted: Recent anger outbursts a couple of times per week.  Including today at work which is unusual..  2 weeks depression and didn't want to get out of bed but also on menstrual cycle.  Lasted a day and resolved. Hungry lately.  No change in meds.  Energy seems down overall.  No particular trigger for these changes.  Says she can handle fatigue better than she can handle the mood swings. Plan: Because of recent mood swings increase Latuda  from 40 mg to 60 mg daily.   07/20/2021 appointment with the following noted: Increase Latuda  seemed to help with less emotional  outbursts.  Manageable now.  Ativan  helps if too stressed out. No SE. Consistent with Latuda  60 and Lamotrigine  200 mg daily. Plan: No med changes Continue Latuda  60 and lamotrigine  200 mg daily.  02/11/22 appt noted: Pregnant [redacted] weeks.  [facility] 05/10/22. gIRL. Pregnancy going well.  WILL BREASTFEED 3 MOS. Mood pretty good.  Some outbursts of anger only around H and now separated and now is fine. Separated since early Feb. No anger otherwise. Separated in the past and had a few therapy sessions.  Difference of opinion and conflict over her 54 yo daughter. HX REALLY BAD POST-PARTUM DEPRESSION BUT NO HI OR SI AND HIT DAY AFTER SHE WAS BORN Patient reports stable mood and denies depressed or irritable moods.  Patient denies any recent difficulty with anxiety.  Patient denies difficulty with sleep initiation or maintenance. Denies appetite disturbance. .  Patient denies any difficulty with concentration.  Patient denies any suicidal ideation.  06/11/22 appt noted: Continues Latuda  60 and lamotrigine  200 mg daily. [name] born 1 month ago today.  Delivery unremarkable and was induced. Iris is healthy.  Issues with breast feeding not enough milk so mostly bottle feeding.  Nurses several times daily.   Growing and normal. Done surprisingly well without sig depression. No bonding problems. Separated and so night care by herself.   Tolerating meds well.   Asks about Concerta .    09/14/22 appt noted: Stopped Concerta  bc interfered with napping and didn't help enough.  Doing ok without it. Mood is fine.  No sig PPD.  Some questions about having baby at her age but talks with therapist with it. Sleep is pretty good.  D sleep through the night. No mood swings, panic nor fear. No obsessive fear about baby.  Normal worry.   No SE issues.   No concerns with meds. Having PT for back and better.  12/08/22 TC with panic.  Needed prn lorazepam  again.  2023/04/02 appt noted: Continues Latuda  60 and lamotrigine   200 mg daily. March mother in hosp and needed lorazepam  used rarely. Couple weeks ago really bad dep day triggered but usually handled that kind of issue well. No sig SI.  Dep lasted about 36 hours after bad for 24 hours.  Had to see therapist immediately and this was only suc episode to occur in recent past.   No avoidance.   D 67 mos old and doing well. Never breast fed since she was 50 weeks old.  No sign manic periods or anger , irritability or ohther mood swings. No SE Lost 30# intentionally.  Plan: continue Latuda  60 and lamotrigine  200 mg daily.  Lorazepam  1 m g prn. No med changes indicated.    09/22/2023 appt noted: M died 04-01-24.  Should cry more than I have.  Not super depressed.  M sick since March bedridden.    Handling it well. Continues meds as above without SE Patient reports stable mood and denies depressed or irritable moods.  Patient denies any recent difficulty with anxiety except rarely.  Rare near panic.  Patient denies difficulty with sleep initiation or maintenance. Denies appetite disturbance.  Patient reports that energy and motivation have been good.  Patient denies any difficulty with concentration.  Patient denies any suicidal ideation. Seriously considering bariatric surgery , g sleeve.   Lost 35# on [employer] but couldn't lose more.  Struggling esp 10 years.    Couldn't qualify for GLP-1 meds bc insurance denial.   Plan: continue Latuda  60 and lamotrigine  200 mg daily. No med changes indicated.    03/05/24 appt noted: Med:  Latuda  60 and lamotrigine  200 mg daily. Great. G sleeve April 2025.  Lost 20#.  Pleased.   Mood still good.  Anxiety is ok.   No SE.   Sleep good except for occ EMA.  Usually good.   Swims 4 days per week.    1 D 47yo.  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   Past Psychiatric Medication Trials: Citalopram, venlafaxine, Wellbutrin ,  lamotrigine  200, Depakote  fair response Latuda  60 good response, Vraylar side effects, Abilify  5,    Rexulti Ritalin , concerta     Review of Systems:  Review of Systems  Constitutional:  Negative for fatigue.  Cardiovascular:  Negative for chest pain and palpitations.  Neurological:  Negative for tremors and weakness.  Psychiatric/Behavioral:  Negative for dysphoric mood. The patient is not nervous/anxious.     Medications: I have reviewed the patient's current medications.  Current Outpatient Medications  Medication Sig Dispense Refill   diphenhydrAMINE  (BENADRYL ) 25 MG tablet Take 25 mg by mouth daily as needed for allergies.     levonorgestrel  (MIRENA ) 20 MCG/DAY IUD 1 each by Intrauterine route once.     levothyroxine  (SYNTHROID ) 50 MCG tablet Take 50 mcg by mouth daily before breakfast.     LORazepam  (ATIVAN ) 1 MG tablet Take 1 tablet (1 mg total) by mouth every 8 (eight) hours as needed for anxiety. 30 tablet 2   Multiple Vitamins-Minerals (MULTIVITAMIN WITH MINERALS) tablet Take 1 tablet by mouth daily.     ondansetron  (ZOFRAN -ODT) 4 MG disintegrating tablet Take 1 tablet (4 mg total) by mouth every 6 (six) hours as needed for nausea or vomiting. 20 tablet 0   oxyCODONE  (OXY IR/ROXICODONE ) 5 MG immediate release tablet Take 1 tablet (5 mg total) by mouth every 6 (six) hours as needed for breakthrough pain or severe pain (pain score 7-10). 10 tablet 0   pantoprazole  (PROTONIX ) 40 MG tablet Take 1 tablet (40 mg total) by mouth daily. 90 tablet 0   gabapentin  (NEURONTIN ) 100 MG capsule Take 1 capsule (100 mg total) by mouth every 12 (twelve) hours for 5 days. 10 capsule 0   lamoTRIgine  (LAMICTAL ) 100 MG tablet Take 2 tablets (200 mg total) by mouth daily. 180 tablet 1   Lurasidone  HCl 120 MG TABS Take 1 tablet (120 mg total) by mouth daily at 12 noon. 90 tablet 0   No current facility-administered medications for this visit.    Medication Side Effects as noted  Allergies: No Known Allergies  Past Medical History:  Diagnosis Date   Abnormal Pap smear of cervix    ADHD  (attention deficit hyperactivity disorder)    Arthritis    Asthma    exercise induced asthma   Bipolar disorder (HCC)    Depression  HPV in female    Hypothyroidism    Pre-diabetes     Family History  Problem Relation Age of Onset   COPD Mother    Thyroid  disease Mother    Pulmonary fibrosis Mother    Hypertension Father    Diabetes Father    Heart disease Father    Cancer Paternal Uncle    Diabetes Paternal Grandfather    Heart disease Paternal Grandfather    Breast cancer Neg Hx    Cervical cancer Neg Hx    Colon cancer Neg Hx    Asthma Neg Hx    Kidney disease Neg Hx    Stroke Neg Hx     Social History   Socioeconomic History   Marital status: Married    Spouse name: Bambi Lever   Number of children: 1   Years of education: Not on file   Highest education level: Not on file  Occupational History   Not on file  Tobacco Use   Smoking status: Former    Current packs/day: 0.00    Types: Cigarettes    Quit date: 01/03/2018    Years since quitting: 6.1   Smokeless tobacco: Never  Vaping Use   Vaping status: Former   Quit date: 04/08/2020   Substances: Nicotine  Substance and Sexual Activity   Alcohol use: Not Currently   Drug use: No   Sexual activity: Yes    Birth control/protection: I.U.D.    Comment: Undecided  Other Topics Concern   Not on file  Social History Narrative   47 year old and 58 month old daughters. Patient works as a Public librarian Strain: Low Risk  (07/25/2023)   Received from [employer] [facility]   Overall Financial Resource Strain (CARDIA)    Difficulty of Paying Living Expenses: Not hard at all  Food Insecurity: No Food Insecurity (01/17/2024)   Hunger Vital Sign    Worried About Running Out of Food in the Last Year: Never true    Ran Out of Food in the Last Year: Never true  Transportation Needs: No Transportation Needs (01/17/2024)   PRAPARE - Scientist, research (physical sciences) (Medical): No    Lack of Transportation (Non-Medical): No  Physical Activity: Not on file  Stress: Not on file  Social Connections: Not on file  Intimate Partner Violence: Patient Declined (01/17/2024)   Humiliation, Afraid, Rape, and Kick questionnaire    Fear of Current or Ex-Partner: Patient declined    Emotionally Abused: Patient declined    Physically Abused: Patient declined    Sexually Abused: Patient declined    Past Medical History, Surgical history, Social history, and Family history were reviewed and updated as appropriate.   Please see review of systems for further details on the patient's review from today.   Objective:   Physical Exam:  There were no vitals taken for this visit.  Physical Exam Constitutional:      General: She is not in acute distress.    Appearance: She is well-developed.  Musculoskeletal:        General: No deformity.  Neurological:     Mental Status: She is alert and oriented to person, place, and time.     Cranial Nerves: No dysarthria.     Coordination: Coordination normal.  Psychiatric:        Attention and Perception: Attention and perception normal. She is attentive.  Mood and Affect: Mood normal. Mood is not anxious or depressed. Affect is not labile, blunt, angry or inappropriate.        Speech: Speech normal.        Behavior: Behavior normal. Behavior is cooperative.        Thought Content: Thought content normal. Thought content is not paranoid or delusional. Thought content does not include homicidal or suicidal ideation. Thought content does not include homicidal or suicidal plan.        Cognition and Memory: Cognition and memory normal.        Judgment: Judgment normal.     Comments: Insight intact     Lab Review:     Component Value Date/Time   NA 136 01/18/2024 0512   NA 141 01/17/2017 1714   K 5.1 01/18/2024 0512   CL 104 01/18/2024 0512   CO2 24 01/18/2024 0512   GLUCOSE 131 (H) 01/18/2024 0512    BUN 8 01/18/2024 0512   BUN 10 01/17/2017 1714   CREATININE 0.66 01/18/2024 0512   CALCIUM 9.1 01/18/2024 0512   PROT 7.6 01/18/2024 0512   PROT 7.7 01/17/2017 1714   ALBUMIN 3.8 01/18/2024 0512   ALBUMIN 4.7 01/17/2017 1714   AST 31 01/18/2024 0512   ALT 24 01/18/2024 0512   ALKPHOS 66 01/18/2024 0512   BILITOT 1.3 (H) 01/18/2024 0512   BILITOT 0.3 01/17/2017 1714   GFRNONAA >60 01/18/2024 0512   GFRAA >60 12/26/2018 0145       Component Value Date/Time   WBC 16.9 (H) 01/18/2024 0512   RBC 4.99 01/18/2024 0512   HGB 14.3 01/18/2024 0512   HGB 11.5 02/26/2022 1136   HCT 42.2 01/18/2024 0512   HCT 35.2 02/26/2022 1136   PLT 328 01/18/2024 0512   PLT 290 02/26/2022 1136   MCV 84.6 01/18/2024 0512   MCV 85 02/26/2022 1136   MCH 28.7 01/18/2024 0512   MCHC 33.9 01/18/2024 0512   RDW 13.7 01/18/2024 0512   RDW 14.3 02/26/2022 1136   LYMPHSABS 1.3 01/18/2024 0512   MONOABS 1.1 (H) 01/18/2024 0512   EOSABS 0.0 01/18/2024 0512   BASOSABS 0.0 01/18/2024 0512    No results found for: "POCLITH", "LITHIUM"   No results found for: "PHENYTOIN", "PHENOBARB", "VALPROATE", "CBMZ"   .res Assessment: Plan:    Ernestene "Jim Motts" was seen today for follow-up.  Diagnoses and all orders for this visit:  Bipolar I disorder (HCC) -     lamoTRIgine  (LAMICTAL ) 100 MG tablet; Take 2 tablets (200 mg total) by mouth daily. -     Lurasidone  HCl 120 MG TABS; Take 1 tablet (120 mg total) by mouth daily at 12 noon.  Panic disorder with agoraphobia  Attention deficit hyperactivity disorder (ADHD), combined type      Overall mood stability is very good.  Patient has a history of good stability in mood with a combination of Depakote  and Latuda .  Off Depakote  and on Latuda  bc prior pregnancy and cannot have stability without medication BC irritabilty.  Mood lability good with increase Latuda  60 mg and overall "way more stable" than with Depakote .  Good resp with lamotrigine /Latuda  combo.   Took  meds with preg and D is doing well.    Disc pending g sleeve.     Because of prior mood swings increased Latuda  from 40 mg to 60 mg daily and she's remained markedly better.  Discussed the usual dosage range.  She is taking a relatively low dose to be the primary  mood stabilizer.  Discussed potential metabolic side effects associated with atypical antipsychotics, as well as potential risk for movement side effects. Advised pt to contact office if movement side effects occur.   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  Okay as needed lorazepam  for rare panic attacks.  Needs Ativan  for trips.  No severe flying phobia. Rare lorazepam  1 mg prn.  continue Latuda  60 and lamotrigine  200 mg daily. No med changes indicated.    FU 6 mos bc stable.    Lisa Beat, MD, DFAPA Please see After Visit Summary for patient specific instructions.  Future Appointments  Date Time Provider Department Center  04/16/2024  9:45 AM Margorie Shelter, RD [facility]       No orders of the defined types were placed in this encounter.   -------------------------------

## 2024-04-16 ENCOUNTER — Encounter: Payer: Self-pay | Admitting: Dietician

## 2024-04-16 ENCOUNTER — Encounter: Attending: General Surgery | Admitting: Dietician

## 2024-04-16 VITALS — Ht 65.0 in | Wt 217.6 lb

## 2024-04-16 DIAGNOSIS — E669 Obesity, unspecified: Secondary | ICD-10-CM | POA: Insufficient documentation

## 2024-04-16 NOTE — Progress Notes (Signed)
 Bariatric Nutrition Follow-Up Visit Medical Nutrition Therapy  Appt Start Time: 0938   End Time: 1009  Surgery date: 01/17/2024 Surgery type: Sleeve Gastrectomy  NUTRITION ASSESSMENT  Anthropometrics  Start weight at [facility]: 252 lbs (date: 11/07/2023)  Height: 5'4.5 in Weight today: 217.6 lbs   Clinical   Pharmacotherapy: History of weight loss medication used: phentermine    Medical hx: Obesity , High cholesterol Medications: multivitamin , B- complex   Labs: A1c - 6 , cholesterol - 222 , ldl -140  Notable signs/symptoms: none  Bowel Habits: Every day to every other day no complaints   Body Composition Scale 01/31/2024 04/16/2024  Current Body Weight 240.3 217.6  Total Body Fat % 44.3 41.0  Visceral Fat 15 12  Fat-Free Mass % 55.7 58.9   Total Body Water  % 42.3 43.9  Muscle-Mass lbs 31.8 32.2  BMI 40.6 35.9  Body Fat Displacement           Torso  lbs 65.9 55.3         Left Leg  lbs 13.1 11.0         Right Leg  lbs 13.1 11.0         Left Arm  lbs 6.5 5.5         Right Arm  lbs 6.5 5.5     Lifestyle & Dietary Hx  Pt states she eats every 3 hours. Pt states she has a lot more energy and her other clothes are fitting again.  Estimated daily fluid intake: 48 oz Estimated daily protein intake: 60 g Supplements: multivitamin, calcium Current average weekly physical activity: workout daily; swim 3 days per week; BELT; also hour walk in the park   24-Hr Dietary Recall First Meal: cottage cheese (6 oz), 1/3 banana or sometimes egg and 1/3 banana Snack: protein shake  Second Meal: tuna fish, and a carb Snack: mozzarella stick and apple  Third Meal: chicken stir fry with vegetables, quinoa  Snack: cheese Beverages: water , sometimes water  with Mio flavorings, one cup of coffee  Post-Op Goals/ Signs/ Symptoms Using straws: yes Drinking while eating: no Chewing/swallowing difficulties: no Changes in vision: no Changes to mood/headaches: no Hair loss/changes to  skin/nails: might have noticed some hair loss Difficulty focusing/concentrating: no Sweating: no Limb weakness: no Dizziness/lightheadedness: no Palpitations: no  Carbonated/caffeinated beverages: no N/V/D/C/Gas: some constipation; every other day; pt states she sometimes uses Miralax. Abdominal pain: no Dumping syndrome: no   NUTRITION DIAGNOSIS  Overweight/obesity (Herrick-3.3) related to past poor dietary habits and physical inactivity as evidenced by completed bariatric surgery and following dietary guidelines for continued weight loss and healthy nutrition status.   NUTRITION INTERVENTION Nutrition counseling (C-1) and education (E-2) to facilitate bariatric surgery goals, including: Diet advancement to the standard prep plan The importance of consuming adequate calories as well as certain nutrients daily due to the body's need for essential vitamins, minerals, and fats The importance of daily physical activity and to reach a goal of at least 150 minutes of moderate to vigorous physical activity weekly (or as directed by their physician) due to benefits such as increased musculature and improved lab values The importance of intuitive eating specifically learning hunger-satiety cues and understanding the importance of learning a new body: The importance of mindful eating to avoid grazing behaviors   Handouts Provided Include  Standard Prep Plan Advancement Guide  Learning Style & Readiness for Change Teaching method utilized: Visual & Auditory  Demonstrated degree of understanding via: Teach Back  Readiness Level: Ready Barriers  to learning/adherence to lifestyle change: nothing identified  RD's Notes for Next Visit Assess adherence to pt chosen goals  MONITORING & EVALUATION Dietary intake, weekly physical activity, body weight.  Next Steps Patient is to follow-up in 3 months for 6 month post-op follow-up/class.

## 2024-05-01 ENCOUNTER — Encounter: Payer: Self-pay | Admitting: Certified Nurse Midwife

## 2024-05-03 NOTE — Progress Notes (Unsigned)
    NURSE VISIT NOTE  Subjective:    Patient ID: Julieann Drummonds, female    DOB: [birth year], 48 y.o.   MRN: [id number]  HPI  Patient is a 48 y.o. G59P1001 female who presents for {pe vag discharge desc:315065} vaginal discharge for *** {gen duration:315003}. Denies abnormal vaginal bleeding or significant pelvic pain or fever. {Actions; denies/reports/admits to:19208} {UTI Symptoms:210800002}. Patient {has/denies:315300} history of known exposure to STD.   Objective:    There were no vitals taken for this visit.   @THIS  VISIT ONLY@  Assessment:   No diagnosis found.  {vaginitis type:315262}  Plan:   GC and chlamydia DNA  probe sent to lab. Treatment: {vaginitis tx:315263} ROV prn if symptoms persist or worsen.   Rollo JINNY Maxin, CMA

## 2024-05-04 ENCOUNTER — Ambulatory Visit (INDEPENDENT_AMBULATORY_CARE_PROVIDER_SITE_OTHER)

## 2024-05-04 ENCOUNTER — Other Ambulatory Visit (HOSPITAL_COMMUNITY)
Admission: RE | Admit: 2024-05-04 | Discharge: 2024-05-04 | Disposition: A | Discharge status: Home / Self Care | Source: Ambulatory Visit | Attending: Obstetrics & Gynecology | Admitting: Obstetrics & Gynecology

## 2024-05-04 VITALS — BP 130/84 | HR 71 | Ht 65.0 in | Wt 216.8 lb

## 2024-05-04 DIAGNOSIS — N760 Acute vaginitis: Secondary | ICD-10-CM

## 2024-05-04 NOTE — Patient Instructions (Signed)
  Vaginitis You will learn about the causes symptoms, and treatment for the 3 main types of vaginitis; vaginosis, yeast infection and trichomoniasis. To view the content, go to this web address: [email or web address]  This video will expire on: 09/07/2025. If you need access to this video following this date, please reach out to the healthcare provider who assigned it to you. This information is not intended to replace advice given to you by your health care provider. Make sure you discuss any questions you have with your health care provider. Elsevier Patient Education  2024 [employer].

## 2024-05-07 ENCOUNTER — Other Ambulatory Visit: Payer: Self-pay | Admitting: Certified Nurse Midwife

## 2024-05-07 ENCOUNTER — Encounter: Payer: Self-pay | Admitting: Certified Nurse Midwife

## 2024-05-07 LAB — CERVICOVAGINAL ANCILLARY ONLY
Bacterial Vaginitis (gardnerella): POSITIVE — AB
Candida Glabrata: NEGATIVE
Candida Vaginitis: NEGATIVE
Chlamydia: NEGATIVE
Comment: NEGATIVE
Comment: NEGATIVE
Comment: NEGATIVE
Comment: NEGATIVE
Comment: NEGATIVE
Comment: NORMAL
Neisseria Gonorrhea: NEGATIVE
Trichomonas: NEGATIVE

## 2024-05-07 MED ORDER — METRONIDAZOLE 500 MG PO TABS
500.0000 mg | ORAL_TABLET | Freq: Two times a day (BID) | ORAL | 0 refills | Status: AC
Start: 2024-05-07 — End: 2024-05-14

## 2024-05-22 ENCOUNTER — Encounter: Payer: Self-pay | Admitting: Certified Nurse Midwife

## 2024-05-25 ENCOUNTER — Ambulatory Visit (INDEPENDENT_AMBULATORY_CARE_PROVIDER_SITE_OTHER)

## 2024-05-25 ENCOUNTER — Other Ambulatory Visit (HOSPITAL_COMMUNITY)
Admission: RE | Admit: 2024-05-25 | Discharge: 2024-05-25 | Disposition: A | Discharge status: Home / Self Care | Source: Ambulatory Visit | Attending: Obstetrics | Admitting: Obstetrics

## 2024-05-25 VITALS — BP 105/74 | HR 89 | Wt 212.0 lb

## 2024-05-25 DIAGNOSIS — R208 Other disturbances of skin sensation: Secondary | ICD-10-CM | POA: Diagnosis not present

## 2024-05-25 DIAGNOSIS — Z113 Encounter for screening for infections with a predominantly sexual mode of transmission: Secondary | ICD-10-CM

## 2024-05-25 NOTE — Progress Notes (Signed)
    NURSE VISIT NOTE  Subjective:    Patient ID: Lisa Schroeder, female    DOB: 11/11/1975, 48 y.o.   MRN: [id number]  HPI  Patient is a 48 y.o. G73P1001 female who presents for test of cure, previous had bacterial vaginitis.Patient also reported she might be getting the start of a UTI. Patient reported slight burning, and odor.  Objective:    BP 105/74 (BP Location: Left Arm, Patient Position: Sitting, Cuff Size: Normal)   Pulse 89   Wt 212 lb (96.2 kg)   LMP 04/23/2024 (Exact Date)   BMI 35.28 kg/m    @THIS  VISIT ONLY@  Assessment:   1. Burning sensation   2. Screening examination for STD (sexually transmitted disease)       Plan:   GC and chlamydia DNA  probe sent to lab. Treatment: swab sent and urine culture sent ROV prn if symptoms persist or worsen.   Burnard LITTIE Ro, CMA

## 2024-05-27 LAB — URINE CULTURE

## 2024-05-29 ENCOUNTER — Other Ambulatory Visit: Payer: Self-pay | Admitting: Obstetrics

## 2024-05-29 ENCOUNTER — Encounter: Payer: Self-pay | Admitting: Obstetrics

## 2024-05-29 DIAGNOSIS — N760 Acute vaginitis: Secondary | ICD-10-CM | POA: Insufficient documentation

## 2024-05-29 LAB — CERVICOVAGINAL ANCILLARY ONLY
Bacterial Vaginitis (gardnerella): POSITIVE — AB
Candida Glabrata: NEGATIVE
Candida Vaginitis: NEGATIVE
Chlamydia: NEGATIVE
Comment: NEGATIVE
Comment: NEGATIVE
Comment: NEGATIVE
Comment: NEGATIVE
Comment: NEGATIVE
Comment: NORMAL
Neisseria Gonorrhea: NEGATIVE
Trichomonas: NEGATIVE

## 2024-05-29 MED ORDER — METRONIDAZOLE 500 MG PO TABS: 500.0000 mg | ORAL_TABLET | Freq: Two times a day (BID) | ORAL | 0 refills | Status: DC

## 2024-05-29 NOTE — Progress Notes (Signed)
+  BV. Rx for metronidazole  500 mg PO BID x 7 days sent to pharmacy. Leah notified via MyChart.  M. Justino, CNM

## 2024-06-13 ENCOUNTER — Other Ambulatory Visit: Payer: Self-pay | Admitting: Psychiatry

## 2024-06-13 DIAGNOSIS — F319 Bipolar disorder, unspecified: Secondary | ICD-10-CM

## 2024-06-13 DIAGNOSIS — F4001 Agoraphobia with panic disorder: Secondary | ICD-10-CM

## 2024-06-20 NOTE — Progress Notes (Unsigned)
   GYN ENCOUNTER  Encounter for ***  Subjective  HPI: Lisa Schroeder is a 48 y.o. G2P1001 who presents today for ***  Past Medical History:  Diagnosis Date   Abnormal Pap smear of cervix    ADHD (attention deficit hyperactivity disorder)    Arthritis    Asthma    exercise induced asthma   Bipolar disorder (HCC)    Depression    HPV in female    Hypothyroidism    Pre-diabetes    Past Surgical History:  Procedure Laterality Date   COLPOSCOPY     for many yrs per pt   LAPAROSCOPIC GASTRIC SLEEVE RESECTION N/A 01/17/2024   Procedure: GASTRECTOMY, SLEEVE, LAPAROSCOPIC;  Surgeon: Tanda Locus, MD;  Location: [facility];  Service: General;  Laterality: N/A;   NO PAST SURGERIES     UPPER GI ENDOSCOPY N/A 01/17/2024   Procedure: ENDOSCOPY, UPPER GI TRACT;  Surgeon: Tanda Locus, MD;  Location: [facility];  Service: General;  Laterality: N/A;   WISDOM TOOTH EXTRACTION Bilateral    OB History     Gravida  2   Para  1   Term  1   Preterm      AB      Living  1      SAB      IAB      Ectopic      Multiple      Live Births  1          No Known Allergies  Constitutional: Denied constitutional symptoms, night sweats, recent illness, fatigue, fever, insomnia and weight loss.  Eyes: Denied eye symptoms, eye pain, photophobia, vision change and visual disturbance.  Ears/Nose/Throat/Neck: Denied ear, nose, throat or neck symptoms, hearing loss, nasal discharge, sinus congestion and sore throat.  Cardiovascular: Denied cardiovascular symptoms, arrhythmia, chest pain/pressure, edema, exercise intolerance, orthopnea and palpitations.  Respiratory: Denied pulmonary symptoms, asthma, pleuritic pain, productive sputum, cough, dyspnea and wheezing.  Gastrointestinal: Denied, gastro-esophageal reflux, melena, nausea and vomiting.  Genitourinary:*** Denied genitourinary symptoms including symptomatic vaginal discharge, pelvic relaxation issues, and urinary complaints.   Musculoskeletal: Denied musculoskeletal symptoms, stiffness, swelling, muscle weakness and myalgia.  Dermatologic: Denied dermatology symptoms, rash and scar.  Neurologic: Denied neurology symptoms, dizziness, headache, neck pain and syncope.  Psychiatric: Denied psychiatric symptoms, anxiety and depression.  Endocrine: Denied endocrine symptoms including hot flashes and night sweats.    Objective  There were no vitals taken for this visit.  Physical examination   Pelvic:   Vulva: Normal appearance.  No lesions.  Vagina: No lesions or abnormalities noted.  Support: Normal pelvic support.  Urethra No masses tenderness or scarring.  Meatus Normal size without lesions or prolapse.  Cervix: Normal appearance.  No lesions.  Anus: Normal exam.  No lesions.  Perineum: Normal exam.  No lesions.        Bimanual   Uterus: Normal size.  Non-tender.  Mobile.  AV.  Adnexae: No masses.  Non-tender to palpation.  Cul-de-sac: Negative for abnormality.    Assessment   Plan    Eleanor Canny, CNM

## 2024-06-21 ENCOUNTER — Ambulatory Visit: Admitting: Obstetrics

## 2024-06-21 ENCOUNTER — Encounter: Payer: Self-pay | Admitting: Obstetrics

## 2024-06-21 VITALS — BP 117/79 | HR 71 | Ht 65.0 in | Wt 205.0 lb

## 2024-06-21 DIAGNOSIS — B9689 Other specified bacterial agents as the cause of diseases classified elsewhere: Secondary | ICD-10-CM

## 2024-06-21 DIAGNOSIS — N760 Acute vaginitis: Secondary | ICD-10-CM

## 2024-06-24 LAB — NUSWAB VAGINITIS PLUS (VG+)
Candida albicans, NAA: NEGATIVE
Candida glabrata, NAA: NEGATIVE
Chlamydia trachomatis, NAA: NEGATIVE
Neisseria gonorrhoeae, NAA: NEGATIVE
Trich vag by NAA: NEGATIVE

## 2024-07-04 ENCOUNTER — Encounter: Payer: Self-pay | Admitting: Obstetrics

## 2024-07-04 ENCOUNTER — Other Ambulatory Visit: Payer: Self-pay | Admitting: Obstetrics

## 2024-07-04 LAB — GENITAL MYCOPLASMAS NAA, SWAB
Mycoplasma genitalium NAA: NEGATIVE
Mycoplasma hominis NAA: NEGATIVE
Ureaplasma spp NAA: POSITIVE — AB

## 2024-07-04 LAB — SPECIMEN STATUS REPORT

## 2024-07-04 MED ORDER — DOXYCYCLINE HYCLATE 100 MG PO CAPS
100.0000 mg | ORAL_CAPSULE | Freq: Two times a day (BID) | ORAL | 0 refills | Status: AC
Start: 2024-07-04 — End: 2024-07-11

## 2024-07-10 ENCOUNTER — Ambulatory Visit

## 2024-07-16 ENCOUNTER — Ambulatory Visit: Admitting: Dietician

## 2024-07-23 ENCOUNTER — Encounter: Payer: Self-pay | Admitting: Dietician

## 2024-07-23 ENCOUNTER — Encounter: Attending: General Surgery | Admitting: Dietician

## 2024-07-23 VITALS — Ht 64.5 in | Wt 200.6 lb

## 2024-07-23 DIAGNOSIS — E669 Obesity, unspecified: Secondary | ICD-10-CM | POA: Diagnosis present

## 2024-07-23 NOTE — Progress Notes (Signed)
 Chief Complaint  Patient presents with  . Annual Exam    Physical     HPI  Lisa Schroeder is a 48 y.o. here for an Annual Physical  Has been feeling well. Has lost approx 63 lbs following Gastric sleeve surgery  Hx of Bipolar disorder - On Lamictal  and Latuda  Has quit tobacco but vapes  Recently had Leap Procedure and there was some bleeding issues  Divorced- Has 2 daughters  Works as a Furniture Conservator/restorer labs: Hgb ; 13.8, Sugar; 96,Se Creat: 0.8 A1c; 5.9 Total Cholesterol; 190, Triglycerides; 176 and TSH; 3.490   ROS Rest of 10 point review of systems is normal.  Outpatient Encounter Medications as of 07/23/2024  Medication Sig Dispense Refill  . lamoTRIgine  (LAMICTAL ) 100 MG tablet Take 200 mg by mouth once daily    . LATUDA  120 mg tablet Take 60 mg by mouth at bedtime    . levonorgestreL  (MIRENA  52 MG) IUD Insert 1 each into the uterus once    . levothyroxine  (SYNTHROID ) 50 MCG tablet TAKE 1 TABLET (50 MCG TOTAL) BY MOUTH ONCE DAILY TAKE ON AN EMPTY STOMACH WITH A GLASS OF WATER  AT LEAST 30-60 MINUTES BEFORE BREAKFAST. 90 tablet 1  . LORazepam  (ATIVAN ) 1 MG tablet Take 1 mg by mouth every 8 (eight) hours as needed for Anxiety    . ergocalciferol , vitamin D2, 1,250 mcg (50,000 unit) capsule Take 1 capsule (50,000 Units total) by mouth once a week for 42 days 6 capsule 0  . lamoTRIgine  (LAMICTAL ) 100 MG tablet TAKE 2&1/2 TABLETS BY MOUTH AT BEDTIME (Patient taking differently: Take 200 mg by mouth at bedtime) 75 tablet 3  . lamoTRIgine  (LAMICTAL ) 200 MG tablet Take 200 mg by mouth once daily    . M-NATAL PLUS tablet Take 1 tablet by mouth once daily (Patient not taking: Reported on 10/14/2023)    . meloxicam (MOBIC) 7.5 MG tablet Take 1 tablet by mouth once daily    . norethindrone  (MICRONOR ) 0.35 mg tablet Take 0.35 mg by mouth once daily    . traZODone (DESYREL) 50 MG tablet TAKE 1 TABLET BY MOUTH EVERYDAY AT BEDTIME 90 tablet 2   No facility-administered encounter  medications on file as of 07/23/2024.    Allergies as of 07/23/2024  . (No Known Allergies)    Past Medical History:  Diagnosis Date  . Anxiety   . Asthma without status asthmaticus (HHS-HCC)   . Bipolar disorder (CMS/HHS-HCC)   . Depression     Past Surgical History:  Procedure Laterality Date  . BURNS  09/27/1981   2ND AND 3RD-DEGREE BURNS  . CHILDBIRTH  09/27/2005  . LSG and upper endo  01/17/2024   Dr. Tanda    Vitals:   07/23/24 0828  BP: 126/82  Pulse: 69   Body mass index is 34.31 kg/m.  Ancillary Orders on 07/16/2024  Component Date Value Ref Range Status  . WBC (White Blood Cell Count) 07/16/2024 7.4  4.1 - 10.2 10^3/uL Final  . RBC (Red Blood Cell Count) 07/16/2024 4.95  4.04 - 5.48 10^6/uL Final  . Hemoglobin 07/16/2024 13.8  12.0 - 15.0 gm/dL Final  . Hematocrit 89/79/7974 41.8  35.0 - 47.0 % Final  . MCV (Mean Corpuscular Volume) 07/16/2024 84.4  80.0 - 100.0 fl Final  . MCH (Mean Corpuscular Hemoglobin) 07/16/2024 27.9  27.0 - 31.2 pg Final  . MCHC (Mean Corpuscular Hemoglobin * 07/16/2024 33.0  32.0 - 36.0 gm/dL Final  . Platelet Count 07/16/2024 300  150 - 450 10^3/uL Final  . RDW-CV (Red Cell Distribution Widt* 07/16/2024 13.4  11.6 - 14.8 % Final  . MPV (Mean Platelet Volume) 07/16/2024 9.3 (L)  9.4 - 12.4 fl Final  . Neutrophils 07/16/2024 4.50  1.50 - 7.80 10^3/uL Final  . Lymphocytes 07/16/2024 2.15  1.00 - 3.60 10^3/uL Final  . Monocytes 07/16/2024 0.51  0.00 - 1.50 10^3/uL Final  . Eosinophils 07/16/2024 0.09  0.00 - 0.55 10^3/uL Final  . Basophils 07/16/2024 0.05  0.00 - 0.09 10^3/uL Final  . Neutrophil % 07/16/2024 61.2  32.0 - 70.0 % Final  . Lymphocyte % 07/16/2024 29.3  10.0 - 50.0 % Final  . Monocyte % 07/16/2024 6.9  4.0 - 13.0 % Final  . Eosinophil % 07/16/2024 1.2  1.0 - 5.0 % Final  . Basophil% 07/16/2024 0.7  0.0 - 2.0 % Final  . Immature Granulocyte % 07/16/2024 0.7  <=0.7 % Final  . Immature Granulocyte Count 07/16/2024 0.05   <=0.06 10^3/L Final  . Glucose 07/16/2024 96  70 - 110 mg/dL Final  . Sodium 89/79/7974 139  136 - 145 mmol/L Final  . Potassium 07/16/2024 4.9  3.6 - 5.1 mmol/L Final  . Chloride 07/16/2024 101  97 - 109 mmol/L Final  . Carbon Dioxide (CO2) 07/16/2024 30.6  22.0 - 32.0 mmol/L Final  . Urea Nitrogen (BUN) 07/16/2024 11  7 - 25 mg/dL Final  . Creatinine 89/79/7974 0.9  0.6 - 1.1 mg/dL Final  . Glomerular Filtration Rate (eGFR) 07/16/2024 79  >60 mL/min/1.73sq m Final   CKD-EPI (2021) does not include patient's race in the calculation of eGFR.  Monitoring changes of plasma creatinine and eGFR over time is useful for monitoring kidney function.   Interpretive Ranges for eGFR (CKD-EPI 2021):  eGFR:       >60 mL/min/1.73 sq. m - Normal eGFR:       30-59 mL/min/1.73 sq. m - Moderately Decreased eGFR:       15-29 mL/min/1.73 sq. m  - Severely Decreased eGFR:       < 15 mL/min/1.73 sq. m  - Kidney Failure    Note: These eGFR calculations do not apply in acute situations when eGFR is changing rapidly or patients on dialysis.  . Calcium 07/16/2024 9.4  8.7 - 10.3 mg/dL Final  . AST  89/79/7974 14  8 - 39 U/L Final  . ALT  07/16/2024 17  5 - 38 U/L Final  . Alk Phos (alkaline Phosphatase) 07/16/2024 98  34 - 104 U/L Final  . Albumin 07/16/2024 4.4  3.5 - 4.8 g/dL Final  . Bilirubin, Total 07/16/2024 0.7  0.3 - 1.2 mg/dL Final  . Protein, Total 07/16/2024 7.5  6.1 - 7.9 g/dL Final  . A/G Ratio 89/79/7974 1.4  1.0 - 5.0 gm/dL Final  . Hemoglobin J8R 07/16/2024 5.9 (H)  4.2 - 5.6 % Final  . Average Blood Glucose (Calc) 07/16/2024 123  mg/dL Final  . Cholesterol, Total 07/16/2024 190  100 - 200 mg/dL Final  . Triglyceride 89/79/7974 176  35 - 199 mg/dL Final  . HDL (High Density Lipoprotein) Cho* 07/16/2024 41.5  35.0 - 85.0 mg/dL Final  . LDL Calculated 07/16/2024 886  0 - 130 mg/dL Final  . VLDL Cholesterol 07/16/2024 35  mg/dL Final  . Cholesterol/HDL Ratio 07/16/2024 4.6   Final  .  Creatinine, Random Urine 07/16/2024 42.9  37.0 - 250.0 mg/dL Final  . Urine Albumin, Random 07/16/2024 <7    mg/L Final  .  Urine Albumin/Creatinine Ratio 07/16/2024 <16.3  <30.0 ug/mg Final   Urine:         Spot collection              (g/mg creatinine)     Normal               < 30   Moderately          30-299         increased   Clinical             >=300 albuminuria  . Thyroid  Stimulating Hormone (TSH) 07/16/2024 3.490  0.450-5.330 uIU/ml uIU/mL Final   Reference Range for Pregnant Females >= 18 yrs old: Normal Range for 1st trimester: 0.05-3.70 ulU/ml Normal Range for 2nd trimester: 0.31-4.35 ulU/ml  . Color 07/16/2024 Colorless  Colorless, Straw, Light Yellow, Yellow, Dark Yellow Final  . Clarity 07/16/2024 Clear  Clear Final  . Specific Gravity 07/16/2024 1.006  1.005 - 1.030 Final  . pH, Urine 07/16/2024 6.5  5.0 - 8.0 Final  . Protein, Urinalysis 07/16/2024 Negative  Negative mg/dL Final  . Glucose, Urinalysis 07/16/2024 Negative  Negative mg/dL Final  . Ketones, Urinalysis 07/16/2024 Negative  Negative mg/dL Final  . Blood, Urinalysis 07/16/2024 Negative  Negative Final  . Nitrite, Urinalysis 07/16/2024 Negative  Negative Final  . Leukocyte Esterase, Urinalysis 07/16/2024 Negative  Negative Final  . Bilirubin, Urinalysis 07/16/2024 Negative  Negative Final  . Urobilinogen, Urinalysis 07/16/2024 0.2  0.2 - 1.0 mg/dL Final  . WBC, UA 89/79/7974 <1  <=5 /hpf Final  . Red Blood Cells, Urinalysis 07/16/2024 <1  <=3 /hpf Final  . Bacteria, Urinalysis 07/16/2024 0-5  0 - 5 /hpf Final  . Squamous Epithelial Cells, Urinaly* 07/16/2024 3  /hpf Final  . Vitamin B12 07/16/2024 453  >300 pg/mL Final  . Iron 07/16/2024 72  28 - 170 ug/dL Final  . Ferritin 89/79/7974 74  11 - 307 ng/mL Final  . Folate (Folic Acid ) 07/16/2024 9.8  >4.0 ng/mL Final    Exam Blood pressure 126/82, pulse 69, height 163.8 cm (5' 4.5), weight 92.1 kg (203 lb), SpO2 98%, unknown if currently  breastfeeding. Body mass index is 34.31 kg/m.  Wt Readings from Last 3 Encounters:  07/23/24 92.1 kg (203 lb)  03/16/24 (!) 103.6 kg (228 lb 6.4 oz)  02/10/24 (!) 108.1 kg (238 lb 6.4 oz)  Body mass index is 34.31 kg/m.  General: Alert oriented x3  Skin: No suspicious lesions or moles.   Eyes: Sclera and conjunctiva clear; pupils equal round and reactive to light extraocular movements intact Ears: External ears and canal normal; tympanic membranes normal.   Nose: Mucosa healthy without drainage or ulceration Oropharynx: No suspicious lesions Neck: No swelling, masses, stiffness, pain, limited movement, carotid pulses normal bilaterally, thyroid  normal size, no masses palpated. No bruits heard. Lungs: Respirations unlabored; clear to auscultation bilaterally Back: No spinal deformity Cardiovascular: Heart regular rate and rhythm without murmurs, gallops, or rubs Abdomen: Soft; ; non distended;  no masses or organomegaly Lymph Nodes: No significant cervical or supraclavicular lymphadenopathy noted Musculoskeletal: no active joint inflammation Extremities: Normal, no edema Neurologic: Alert and oriented; speech intact; face symmetrical; moves all extremities well     Assessment and Plan:    1 Bipolar disorder: Stable on current regimen On Lamictal  and Latuda  Sees Dr.Cottle in [place] 2 ADD; Doing better -Has come off  Concerta   3 Hypothyroidism:  On Levothyroxine  50  mcg po qd- TSH is 3.490 Monitor 4  Pure hypercholesterolemia; Currently not on meds Monitor  5 Obesity:Pt states insurance did not cover Wegovy  Gastric Sleeve surgery worked well  6 Elevated A1c (5.9) Discussed low carb diet  7 Vapes nicotine containing substance; prescription sent for Nicoderm CQ patches  8 Health Maintenance; Pelvic and Pap -Ok 06/06/23 Sees  Dr. Harland Birkenhead  Up to date with Flu shot (Today)and COVID 19 vaccine  Cologuard test : Negative  Check cbc, Met-c, Lipids ua. PT-INR  and TSH 1 week  prior to next visit  Follow up in 6 months    Tamra Leventhal  MD

## 2024-07-23 NOTE — Progress Notes (Signed)
 Goals     . Follow my doctor's care plan     Follow my doctors treatment plan and follow-up as scheduled. Take all medications as prescribed and report any changes as necessary.     . Maintain health/healthy lifestyle     Patient states they would like to maintain a healthy lifestyle by maintaining a healthy diet and exercise routine       \

## 2024-07-23 NOTE — Progress Notes (Signed)
 Bariatric Nutrition Follow-Up Visit Medical Nutrition Therapy  Appt Start Time: 1518   End Time: 1548  Surgery date: 01/17/2024 Surgery type: Sleeve Gastrectomy  NUTRITION ASSESSMENT  Anthropometrics  Start weight at [facility]: 252 lbs (date: 11/07/2023)  Height: 5'4.5 in Weight today: 200.6 lbs   Clinical   Pharmacotherapy: History of weight loss medication used: phentermine    Medical hx: Obesity , High cholesterol Medications: multivitamin , B- complex   Labs: A1c 5.9 Notable signs/symptoms: none  Bowel Habits: Every day to every other day no complaints   Body Composition Scale 01/31/2024 04/16/2024 07/23/2024  Current Body Weight 240.3 217.6 200.6  Total Body Fat % 44.3 41.0 38.7  Visceral Fat 15 12 11   Fat-Free Mass % 55.7 58.9 61.2   Total Body Water  % 42.3 43.9 45.1  Muscle-Mass lbs 31.8 32.2 31.9  BMI 40.6 35.9 33.0  Body Fat Displacement            Torso  lbs 65.9 55.3 48.0         Left Leg  lbs 13.1 11.0 9.6         Right Leg  lbs 13.1 11.0 9.6         Left Arm  lbs 6.5 5.5 4.8         Right Arm  lbs 6.5 5.5 4.8    Lifestyle & Dietary Hx  Pt states she may go a day in a week without a bowel movement, stating some days she will have two bowel movements. Pt states for the past month she has not been on her game. Pt states she has not been working out, stating she had to quit the [employer] program, stating she needed to spend more time with her children. Pt states she used to go to the pool in the morning, stating that could not be sustained. Pt plans to work out at home, stating it is more realistic. Pt states she wants to use [employer] program for accountability, stating her Weight [employer] coach/contact has experience with participants who have had bariatric surgery. Pt states she did have some alcohol, stating that she was feeling the effects with just a few sips.  Estimated daily fluid intake: 50 oz Estimated daily protein intake: 60 g Supplements:  multivitamin, calcium Current average weekly physical activity: ADLs  24-Hr Dietary Recall First Meal: cottage cheese (6 oz), 1/3 banana or sometimes egg and 1/3 banana Snack: protein shake  Second Meal: tuna fish, and a carb Snack: mozzarella stick and apple  Third Meal: chicken stir fry with vegetables, quinoa  Snack: cheese Beverages: water , sometimes water  with Mio flavorings, one cup of coffee  Post-Op Goals/ Signs/ Symptoms Using straws: yes Drinking while eating: no Chewing/swallowing difficulties: no Changes in vision: no Changes to mood/headaches: no Hair loss/changes to skin/nails: might have noticed some hair loss Difficulty focusing/concentrating: no Sweating: no Limb weakness: no Dizziness/lightheadedness: no Palpitations: no  Carbonated/caffeinated beverages: no N/V/D/C/Gas: some constipation; every other day; pt states she sometimes uses Miralax. Abdominal pain: no Dumping syndrome: no   NUTRITION DIAGNOSIS  Overweight/obesity ([place]-3.3) related to past poor dietary habits and physical inactivity as evidenced by completed bariatric surgery and following dietary guidelines for continued weight loss and healthy nutrition status.   NUTRITION INTERVENTION Nutrition counseling (C-1) and education (E-2) to facilitate bariatric surgery goals, including:  Six months after bariatric surgery--and well beyond--physical activity becomes a cornerstone of long-term success. At this stage, your energy is improving, and regular movement helps maintain weight loss, preserve  lean muscle, and support metabolic health. Exercise also boosts mood, reduces stress, and strengthens your heart and bones. Whether it's walking, swimming, strength training, or group fitness, staying active helps reinforce the lifestyle changes that surgery initiated. The key is consistency: finding activities you enjoy and making them part of your routine ensures that physical activity remains a lifelong ally in  your health journey.  Using Weight [employer] for accountability can complement post-bariatric strategies and habits you've already built. The key is to stay grounded in the principles that support your surgical success: prioritizing protein at every meal and snack, eating slowly and mindfully, planning meals in advance, and avoiding high-sugar or high-volume foods. While Weight [employer]chers offers flexibility and community support, it's important to adapt the program to your unique nutritional needs--tracking points with an eye toward protein goals, hydration, and portion control.  Goals Increase physical activity; aim for 30 minutes 5 days per week; pt plans for workout videos at home  Handouts Provided Include  Standard Prep Plan Advancement Guide  Learning Style & Readiness for Change Teaching method utilized: Visual & Auditory  Demonstrated degree of understanding via: Teach Back  Readiness Level: Ready Barriers to learning/adherence to lifestyle change: nothing identified  RD's Notes for Next Visit Assess adherence to pt chosen goals  MONITORING & EVALUATION Dietary intake, weekly physical activity, body weight.  Next Steps Patient is to follow-up in 3 months for 9 months post-op follow-up.

## 2024-09-03 ENCOUNTER — Ambulatory Visit: Admitting: Psychiatry

## 2024-10-08 ENCOUNTER — Ambulatory Visit: Admitting: Obstetrics & Gynecology

## 2024-10-15 ENCOUNTER — Encounter: Admitting: Dietician

## 2024-10-25 ENCOUNTER — Encounter: Payer: Self-pay | Admitting: Psychiatry

## 2024-10-25 ENCOUNTER — Ambulatory Visit: Admitting: Psychiatry

## 2024-10-25 DIAGNOSIS — F319 Bipolar disorder, unspecified: Secondary | ICD-10-CM

## 2024-10-25 DIAGNOSIS — F4001 Agoraphobia with panic disorder: Secondary | ICD-10-CM | POA: Diagnosis not present

## 2024-10-25 DIAGNOSIS — F902 Attention-deficit hyperactivity disorder, combined type: Secondary | ICD-10-CM

## 2024-10-25 MED ORDER — LAMOTRIGINE 100 MG PO TABS: 200.0000 mg | ORAL_TABLET | Freq: Every day | ORAL | 1 refills | Status: AC

## 2024-10-25 MED ORDER — LURASIDONE HCL 120 MG PO TABS: 120.0000 mg | ORAL_TABLET | Freq: Every day | ORAL | 0 refills | Status: AC

## 2024-10-25 NOTE — Progress Notes (Signed)
 Lisa Schroeder [id number] 10/01/1975 49 y.o.     Subjective:   Patient ID:  Lisa Schroeder is a 49 y.o. (DOB 1976-05-04) female.  Chief Complaint:  Chief Complaint  Patient presents with   Follow-up    Mood and meds     HPI Lisa Schroeder presents to the office today for follow-up of bipolar disorder and anxiety disorders.  visit June 2020.  She could not afford Metadate  so she was given a prescription for Concerta  36 mg daily.  No other med changes.  She remained on Abilify  5 mg daily, lamotrigine  200 mg daily.   She called May 22, 2019 stating that she was having panic attacks and asked for prescription for Ativan .  It was okayed for her to use sparingly.   seen October 05, 2019.  She described a pattern of hypersexuality as well as some other manic symptoms that she and her therapist had discussed.  We decided to switch from Abilify  to Depakote  as a mood stabilizer.   11/16/2019 appointment with the following noted: Did switch sx.  Episodes mania are gone but more depression.  Couple weeks ago didn't want to leave the house.  Abilify  did better with the depression. No manic sx in the last 3 weeks.  No further hypersexuality resolved.  Sleeping well.  Some racing thoughts but better.  Not irritable..  Some anxiety situational.   Concerta  affordable. 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   Doing well without major issues.    Last visit changed to Metadate  CD and that worked well but over $100.  Better energy and focus and more productive.  Tolerated well.  Ran out.  Dose seems good. Does ok without stimulant but better focus and energy and happier on it.  Be nice if insurance would cover it. Plan:Unfortunately she's been more depressed and has a tremor. Increase lamotrigine  to 150 mg daily to help with depression BC tremor reduce Depakote  to 750 mg HS.  Disc risk return of mania. Follow-up 8 weeks.   March 28, 2020 appointment  with the following noted Fine.  Hardly ever take Concerta  bc it caused the tremor. Disc goal to get pregnant and need to get off Depakote . Rare panic.  Tolerates Ativan .  Ok off Concerta . Not depressed.  No manic behavior. Plan: Wean Depakote  for pregnancy over 10 days.  Disc risk of this causing birth defects etc.  Disc risk mood swings without it.  Call. First step would be to increase lamotrigine .   TC 06/06/20 Pt with rapid mood swings off Depakote .  Might be pregnant.  Needs another stabilizer.  Took Latuda  before but stopped DT cost.  Agrees to restart bc it's one of safest in pregnancy.  Will send in RX for 40 mg Latuda .   07/02/2020 appointment with the following noted: Not pregnant.  I feel 10 times better in a few days.  Happier and more productive.  No crying anymore. No significant anger like before.  Sleep is good. Last Friday was dizzy and told she could be having SE.  He told her to cut Latuda  to 20 and then increase it back to 40 mg.  Back on 40 mg for 4-5 days.   Plan: Off Depakote  and on Latuda  & lamotrigine   bc pursuing pregnancy and cannot have stability without medication.  10/01/2020 appointment with the following noted: Mood pretty stable except occ moodiness with PMS.  No sig depression.  Anxiety manageable with 2-3  Ativan  including when flying.  No major problems.  No SE. Pursuing pregnancy.   Plan: no changes  02/19/21 appt noted: Recent anger outbursts a couple of times per week.  Including today at work which is unusual..  2 weeks depression and didn't want to get out of bed but also on menstrual cycle.  Lasted a day and resolved. Hungry lately.  No change in meds.  Energy seems down overall.  No particular trigger for these changes.  Says she can handle fatigue better than she can handle the mood swings. Plan: Because of recent mood swings increase Latuda  from 40 mg to 60 mg daily.   07/20/2021 appointment with the following noted: Increase Latuda  seemed to help  with less emotional outbursts.  Manageable now.  Ativan  helps if too stressed out. No SE. Consistent with Latuda  60 and Lamotrigine  200 mg daily. Plan: No med changes Continue Latuda  60 and lamotrigine  200 mg daily.  02/11/22 appt noted: Pregnant [redacted] weeks.  [facility] 05/10/22. gIRL. Pregnancy going well.  WILL BREASTFEED 3 MOS. Mood pretty good.  Some outbursts of anger only around H and now separated and now is fine. Separated since early Feb. No anger otherwise. Separated in the past and had a few therapy sessions.  Difference of opinion and conflict over her 37 yo daughter. HX REALLY BAD POST-PARTUM DEPRESSION BUT NO HI OR SI AND HIT DAY AFTER SHE WAS BORN Patient reports stable mood and denies depressed or irritable moods.  Patient denies any recent difficulty with anxiety.  Patient denies difficulty with sleep initiation or maintenance. Denies appetite disturbance. .  Patient denies any difficulty with concentration.  Patient denies any suicidal ideation.  06/11/22 appt noted: Continues Latuda  60 and lamotrigine  200 mg daily. Lisa Schroeder born 1 month ago today.  Delivery unremarkable and was induced. Schroeder is healthy.  Issues with breast feeding not enough milk so mostly bottle feeding.  Nurses several times daily.   Growing and normal. Done surprisingly well without sig depression. No bonding problems. Separated and so night care by herself.   Tolerating meds well.   Asks about Concerta .    09/14/22 appt noted: Stopped Concerta  bc interfered with napping and didn't help enough.  Doing ok without it. Mood is fine.  No sig PPD.  Some questions about having baby at her age but talks with therapist with it. Sleep is pretty good.  Lisa sleep through the night. No mood swings, panic nor fear. No obsessive fear about baby.  Normal worry.   No SE issues.   No concerns with meds. Having PT for back and better.  12/08/22 TC with panic.  Needed prn lorazepam  again.  03/16/23 appt noted: Continues Latuda   60 and lamotrigine  200 mg daily. March mother in hosp and needed lorazepam  used rarely. Couple weeks ago really bad dep day triggered but usually handled that kind of issue well. No sig SI.  Dep lasted about 36 hours after bad for 24 hours.  Had to see therapist immediately and this was only suc episode to occur in recent past.   No avoidance.   Lisa 55 mos old and doing well. Never breast fed since she was 35 weeks old.  No sign manic periods or anger , irritability or ohther mood swings. No SE Lost 30# intentionally.  Plan: continue Latuda  60 and lamotrigine  200 mg daily.  Lorazepam  1 m g prn. No med changes indicated.    09/12/23 appt noted: M died 11/08/24.  Should cry more than  I have.  Not super depressed.  M sick since March bedridden.    Handling it well. Continues meds as above without SE Patient reports stable mood and denies depressed or irritable moods.  Patient denies any recent difficulty with anxiety except rarely.  Rare near panic.  Patient denies difficulty with sleep initiation or maintenance. Denies appetite disturbance.  Patient reports that energy and motivation have been good.  Patient denies any difficulty with concentration.  Patient denies any suicidal ideation. Seriously considering bariatric surgery , g sleeve.   Lost 35# on [employer] but couldn't lose more.  Struggling esp 10 years.    Couldn't qualify for GLP-1 meds bc insurance denial.   Plan: continue Latuda  60 and lamotrigine  200 mg daily. No med changes indicated.    03/05/24 appt noted: Med:  Latuda  60 and lamotrigine  200 mg daily. Great. G sleeve April 2025.  Lost 20#.  Pleased.   Mood still good.  Anxiety is ok.   No SE.   Sleep good except for occ EMA.  Usually good.   Swims 4 days per week.   Plan No changes  10/25/24 appt noted:  Med;  Latuda  60 and lamotrigine  200 mg AM.  Ativan  1 mg prn about 2 / month Sometimes anxiety at night about the next day.  For no reason.   Usually enough sleep.   Sleep better  since wt loss surgery and eating healthier and exercising.   Sees therapist.   Usually mood is good.  If forgets med then can get down quickly.   No SE.     1 Lisa seeing psychiatrist  and much benefit with Abilify .  49yo.  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   Past Psychiatric Medication Trials:  Citalopram, venlafaxine, Wellbutrin ,  lamotrigine  200 for years.   Depakote  fair response Latuda  60 good response, Vraylar side effects, Abilify  5,   Rexulti Ritalin , concerta     Review of Systems:  Review of Systems  Constitutional:  Negative for fatigue.  Cardiovascular:  Negative for chest pain and palpitations.  Neurological:  Negative for tremors and weakness.  Psychiatric/Behavioral:  Negative for dysphoric mood. The patient is not nervous/anxious.     Medications: I have reviewed the patient's current medications.  Current Outpatient Medications  Medication Sig Dispense Refill   Cholecalciferol (VITAMIN Lisa -3 PO) Take by mouth.     diphenhydrAMINE  (BENADRYL ) 25 MG tablet Take 25 mg by mouth daily as needed for allergies.     levonorgestrel  (MIRENA ) 20 MCG/DAY IUD 1 each by Intrauterine route once.     levothyroxine  (SYNTHROID ) 50 MCG tablet Take 50 mcg by mouth daily before breakfast.     LORazepam  (ATIVAN ) 1 MG tablet TAKE 1 TABLET BY MOUTH EVERY 8 HOURS AS NEEDED FOR ANXIETY 30 tablet 2   Multiple Vitamins-Minerals (MULTIVITAMIN WITH MINERALS) tablet Take 1 tablet by mouth daily.     gabapentin  (NEURONTIN ) 100 MG capsule Take 1 capsule (100 mg total) by mouth every 12 (twelve) hours for 5 days. (Patient not taking: Reported on 10/25/2024) 10 capsule 0   lamoTRIgine  (LAMICTAL ) 100 MG tablet Take 2 tablets (200 mg total) by mouth daily. 180 tablet 1   Lurasidone  HCl 120 MG TABS Take 1 tablet (120 mg total) by mouth daily at 12 noon. 90 tablet 0   ondansetron  (ZOFRAN -ODT) 4 MG disintegrating tablet Take 1 tablet (4 mg total) by mouth every 6 (six) hours as needed for nausea or vomiting.  (Patient not taking: Reported on 10/25/2024) 20 tablet 0  No current facility-administered medications for this visit.    Medication Side Effects as noted  Allergies: No Known Allergies  Past Medical History:  Diagnosis Date   Abnormal Pap smear of cervix    ADHD (attention deficit hyperactivity disorder)    Arthritis    Asthma    exercise induced asthma   Bipolar disorder (HCC)    Depression    HPV in female    Hypothyroidism    Pre-diabetes     Family History  Problem Relation Age of Onset   COPD Mother    Thyroid  disease Mother    Pulmonary fibrosis Mother    Hypertension Father    Diabetes Father    Heart disease Father    Cancer Paternal Uncle    Diabetes Paternal Grandfather    Heart disease Paternal Grandfather    Breast cancer Neg Hx    Cervical cancer Neg Hx    Colon cancer Neg Hx    Asthma Neg Hx    Kidney disease Neg Hx    Stroke Neg Hx     Social History   Socioeconomic History   Marital status: Married    Spouse name: Ozell   Number of children: 1   Years of education: Not on file   Highest education level: Not on file  Occupational History   Not on file  Tobacco Use   Smoking status: Former    Current packs/day: 0.00    Types: Cigarettes    Quit date: 01/03/2018    Years since quitting: 6.8   Smokeless tobacco: Never  Vaping Use   Vaping status: Former   Quit date: 04/08/2020   Substances: Nicotine  Substance and Sexual Activity   Alcohol use: Not Currently   Drug use: No   Sexual activity: Yes    Birth control/protection: I.U.Lisa.    Comment: Undecided  Other Topics Concern   Not on file  Social History Narrative   48 year old and 66 month old daughters. Patient works as a Primary School Teacher [employer]   Tobacco Use: Medium Risk (10/25/2024)   Patient History    Smoking Tobacco Use: Former    Smokeless Tobacco Use: Never    Passive Exposure: Not on Actuary Strain: Low Risk  (07/23/2024)    Received from [facility]   Overall Financial Resource Strain (CARDIA)    Difficulty of Paying Living Expenses: Not hard at all  Food Insecurity: No Food Insecurity (07/23/2024)   Received from [facility]   Epic    Within the past 12 months, you worried that your food would run out before you got the money to buy more.: Never true    Within the past 12 months, the food you bought just didn't last and you didn't have money to get more.: Never true  Transportation Needs: No Transportation Needs (07/23/2024)   Received from [facility] - Transportation    In the past 12 months, has lack of transportation kept you from medical appointments or from getting medications?: No    Lack of Transportation (Non-Medical): No  Physical Activity: Not on file  Stress: Not on file  Social Connections: Not on file  Intimate Partner Violence: Patient Declined (01/17/2024)   Humiliation, Afraid, Rape, and Kick questionnaire    Fear of Current or Ex-Partner: Patient declined    Emotionally Abused: Patient declined    Physically Abused: Patient declined  Sexually Abused: Patient declined  Depression (PHQ2-9): Low Risk (11/07/2023)   Depression (PHQ2-9)    PHQ-2 Score: 0  Alcohol Screen: Not on file  Housing: Low Risk  (07/23/2024)   [facility]   Epic    In the last 12 months, was there a time when you were not able to pay the mortgage or rent on time?: No    In the past 12 months, how many times have you moved where you were living?: 0    At any time in the past 12 months, were you homeless or living in a shelter (including now)?: No  Utilities: Not At Risk (07/23/2024)   Received from [facility]   Epic    In the past 12 months has the electric, gas, oil, or water  company threatened to shut off services in your home?: No  Health Literacy: Not on file    Past Medical History, Surgical  history, Social history, and Family history were reviewed and updated as appropriate.   Please see review of systems for further details on the patient's review from today.   Objective:   Physical Exam:  There were no vitals taken for this visit.  Physical Exam Constitutional:      General: She is not in acute distress.    Appearance: She is well-developed.  Musculoskeletal:        General: No deformity.  Neurological:     Mental Status: She is alert and oriented to person, place, and time.     Cranial Nerves: No dysarthria.     Coordination: Coordination normal.  Psychiatric:        Attention and Perception: Attention and perception normal. She is attentive.        Mood and Affect: Mood normal. Mood is not anxious or depressed. Affect is not labile, blunt, angry or inappropriate.        Speech: Speech normal.        Behavior: Behavior normal. Behavior is cooperative.        Thought Content: Thought content normal. Thought content is not paranoid or delusional. Thought content does not include homicidal or suicidal ideation. Thought content does not include homicidal or suicidal plan.        Cognition and Memory: Memory normal. Cognition is not impaired.        Judgment: Judgment normal.     Comments: Insight intact     Lab Review:     Component Value Date/Time   NA 136 01/18/2024 0512   NA 141 01/17/2017 1714   K 5.1 01/18/2024 0512   CL 104 01/18/2024 0512   CO2 24 01/18/2024 0512   GLUCOSE 131 (H) 01/18/2024 0512   BUN 8 01/18/2024 0512   BUN 10 01/17/2017 1714   CREATININE 0.66 01/18/2024 0512   CALCIUM 9.1 01/18/2024 0512   PROT 7.6 01/18/2024 0512   PROT 7.7 01/17/2017 1714   ALBUMIN 3.8 01/18/2024 0512   ALBUMIN 4.7 01/17/2017 1714   AST 31 01/18/2024 0512   ALT 24 01/18/2024 0512   ALKPHOS 66 01/18/2024 0512   BILITOT 1.3 (H) 01/18/2024 0512   BILITOT 0.3 01/17/2017 1714   GFRNONAA >60 01/18/2024 0512   GFRAA >60 12/26/2018 0145       Component Value  Date/Time   WBC 16.9 (H) 01/18/2024 0512   RBC 4.99 01/18/2024 0512   HGB 14.3 01/18/2024 0512   HGB 11.5 02/26/2022 1136   HCT 42.2 01/18/2024 0512  HCT 35.2 02/26/2022 1136   PLT 328 01/18/2024 0512   PLT 290 02/26/2022 1136   MCV 84.6 01/18/2024 0512   MCV 85 02/26/2022 1136   MCH 28.7 01/18/2024 0512   MCHC 33.9 01/18/2024 0512   RDW 13.7 01/18/2024 0512   RDW 14.3 02/26/2022 1136   LYMPHSABS 1.3 01/18/2024 0512   MONOABS 1.1 (H) 01/18/2024 0512   EOSABS 0.0 01/18/2024 0512   BASOSABS 0.0 01/18/2024 0512    No results found for: POCLITH, LITHIUM   No results found for: PHENYTOIN, PHENOBARB, VALPROATE, CBMZ   .res Assessment: Plan:    Lisa Schroeder was seen today for follow-up.  Diagnoses and all orders for this visit:  Bipolar I disorder (HCC) -     Lurasidone  HCl 120 MG TABS; Take 1 tablet (120 mg total) by mouth daily at 12 noon. -     lamoTRIgine  (LAMICTAL ) 100 MG tablet; Take 2 tablets (200 mg total) by mouth daily.  Panic disorder with agoraphobia  Attention deficit hyperactivity disorder (ADHD), combined type    Overall mood stability is very good.  Patient has a history of good stability in mood with a combination of Depakote  and Latuda .  Off Depakote  and on Latuda  bc prior pregnancy and cannot have stability without medication BC irritabilty.  Mood lability good with increase Latuda  60 mg and overall way more stable than with Depakote .  Good resp with lamotrigine /Latuda  combo.   Because of prior mood swings increased Latuda  from 40 mg to 60 mg daily and she's remained markedly better.  Discussed the usual dosage range.  She is taking a relatively low dose to be the primary mood stabilizer.  Discussed potential metabolic side effects associated with atypical antipsychotics, as well as potential risk for movement side effects. Advised pt to contact office if movement side effects occur.   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  Okay as needed lorazepam  for rare panic  attacks.  Needs Ativan  for trips.  No severe flying phobia. Rare lorazepam  1 mg prn. We discussed the short-term risks associated with benzodiazepines including sedation and increased fall risk among others.  Discussed long-term side effect risk including dependence, potential withdrawal symptoms, and the potential eventual dose-related risk of dementia.  But recent studies from 2020 dispute this association between benzodiazepines and dementia risk. Newer studies in 2020 do not support an association with dementia.  continue Latuda  60 and lamotrigine  200 mg daily. No med changes indicated.    FU 6 mos bc stable.    Lorene Macintosh, MD, DFAPA Please see After Visit Summary for patient specific instructions.  No future appointments.      No orders of the defined types were placed in this encounter.   -------------------------------

## 2025-04-22 ENCOUNTER — Ambulatory Visit: Admitting: Psychiatry
